# Patient Record
Sex: Female | Born: 1960
Health system: Southern US, Community
[De-identification: ages and names within clinical notes are randomized; demographics above are authoritative.]

## PROBLEM LIST (undated history)

## (undated) DIAGNOSIS — I5032 Chronic diastolic (congestive) heart failure: Secondary | ICD-10-CM

## (undated) DIAGNOSIS — E1165 Type 2 diabetes mellitus with hyperglycemia: Secondary | ICD-10-CM

## (undated) DIAGNOSIS — E669 Obesity, unspecified: Secondary | ICD-10-CM

## (undated) DIAGNOSIS — I10 Essential (primary) hypertension: Secondary | ICD-10-CM

## (undated) DIAGNOSIS — I700 Atherosclerosis of aorta: Secondary | ICD-10-CM

## (undated) DIAGNOSIS — I119 Hypertensive heart disease without heart failure: Secondary | ICD-10-CM

## (undated) HISTORY — DX: Essential (primary) hypertension: I10

## (undated) HISTORY — PX: TUBAL LIGATION: SHX77

## (undated) HISTORY — PX: KIDNEY SURGERY: SHX687

---

## 1999-08-18 ENCOUNTER — Emergency Department (HOSPITAL_COMMUNITY): Admission: EM | Admit: 1999-08-18 | Discharge: 1999-08-18 | Payer: Self-pay | Admitting: Emergency Medicine

## 1999-08-18 ENCOUNTER — Encounter: Payer: Self-pay | Admitting: Emergency Medicine

## 1999-10-29 ENCOUNTER — Encounter: Payer: Self-pay | Admitting: Emergency Medicine

## 1999-10-29 ENCOUNTER — Emergency Department (HOSPITAL_COMMUNITY): Admission: EM | Admit: 1999-10-29 | Discharge: 1999-10-29 | Payer: Self-pay | Admitting: Emergency Medicine

## 2000-06-24 ENCOUNTER — Encounter: Admission: RE | Admit: 2000-06-24 | Discharge: 2000-06-24 | Payer: Self-pay | Admitting: *Deleted

## 2000-06-26 ENCOUNTER — Other Ambulatory Visit: Admission: RE | Admit: 2000-06-26 | Discharge: 2000-06-26 | Payer: Self-pay | Admitting: Family Medicine

## 2000-06-26 ENCOUNTER — Encounter: Payer: Self-pay | Admitting: Family Medicine

## 2000-06-26 ENCOUNTER — Encounter: Admission: RE | Admit: 2000-06-26 | Discharge: 2000-06-26 | Payer: Self-pay | Admitting: Family Medicine

## 2000-07-01 ENCOUNTER — Other Ambulatory Visit: Admission: RE | Admit: 2000-07-01 | Discharge: 2000-07-01 | Payer: Self-pay | Admitting: Family Medicine

## 2003-04-29 ENCOUNTER — Emergency Department (HOSPITAL_COMMUNITY): Admission: EM | Admit: 2003-04-29 | Discharge: 2003-04-29 | Payer: Self-pay | Admitting: Emergency Medicine

## 2006-06-10 ENCOUNTER — Emergency Department (HOSPITAL_COMMUNITY): Admission: EM | Admit: 2006-06-10 | Discharge: 2006-06-10 | Payer: Self-pay | Admitting: Emergency Medicine

## 2006-11-09 ENCOUNTER — Emergency Department (HOSPITAL_COMMUNITY): Admission: EM | Admit: 2006-11-09 | Discharge: 2006-11-09 | Payer: Self-pay | Admitting: Family Medicine

## 2007-03-24 ENCOUNTER — Emergency Department (HOSPITAL_COMMUNITY): Admission: EM | Admit: 2007-03-24 | Discharge: 2007-03-24 | Payer: Self-pay | Admitting: Family Medicine

## 2007-05-30 ENCOUNTER — Emergency Department (HOSPITAL_COMMUNITY): Admission: EM | Admit: 2007-05-30 | Discharge: 2007-05-30 | Payer: Self-pay | Admitting: Family Medicine

## 2009-12-20 ENCOUNTER — Ambulatory Visit (HOSPITAL_COMMUNITY): Admission: RE | Admit: 2009-12-20 | Discharge: 2009-12-20 | Payer: Self-pay | Admitting: Family Medicine

## 2010-07-13 ENCOUNTER — Emergency Department (HOSPITAL_COMMUNITY): Admission: EM | Admit: 2010-07-13 | Discharge: 2010-07-13 | Payer: Self-pay | Admitting: Emergency Medicine

## 2010-12-13 LAB — DIFFERENTIAL
Basophils Absolute: 0 10*3/uL (ref 0.0–0.1)
Basophils Relative: 1 % (ref 0–1)
Eosinophils Absolute: 0.1 10*3/uL (ref 0.0–0.7)
Neutro Abs: 4.1 10*3/uL (ref 1.7–7.7)
Neutrophils Relative %: 66 % (ref 43–77)

## 2010-12-13 LAB — POCT I-STAT, CHEM 8
Creatinine, Ser: 1.4 mg/dL — ABNORMAL HIGH (ref 0.4–1.2)
Glucose, Bld: 104 mg/dL — ABNORMAL HIGH (ref 70–99)
HCT: 44 % (ref 36.0–46.0)
Hemoglobin: 15 g/dL (ref 12.0–15.0)
Potassium: 4.4 mEq/L (ref 3.5–5.1)
Sodium: 138 mEq/L (ref 135–145)
TCO2: 25 mmol/L (ref 0–100)

## 2010-12-13 LAB — CBC
HCT: 40.1 % (ref 36.0–46.0)
Hemoglobin: 13 g/dL (ref 12.0–15.0)
MCH: 27.8 pg (ref 26.0–34.0)
MCHC: 32.4 g/dL (ref 30.0–36.0)
MCV: 85.7 fL (ref 78.0–100.0)
Platelets: 232 10*3/uL (ref 150–400)
RBC: 4.68 MIL/uL (ref 3.87–5.11)
RDW: 18.2 % — ABNORMAL HIGH (ref 11.5–15.5)
WBC: 6.3 10*3/uL (ref 4.0–10.5)

## 2010-12-13 LAB — POCT INFECTIOUS MONO SCREEN: Mono Screen: NEGATIVE

## 2012-12-06 ENCOUNTER — Ambulatory Visit (INDEPENDENT_AMBULATORY_CARE_PROVIDER_SITE_OTHER): Payer: PRIVATE HEALTH INSURANCE | Admitting: Family Medicine

## 2012-12-06 VITALS — BP 186/102 | HR 88 | Temp 98.1°F | Resp 16 | Ht 60.0 in | Wt 163.0 lb

## 2012-12-06 DIAGNOSIS — K0889 Other specified disorders of teeth and supporting structures: Secondary | ICD-10-CM

## 2012-12-06 DIAGNOSIS — R05 Cough: Secondary | ICD-10-CM

## 2012-12-06 DIAGNOSIS — K029 Dental caries, unspecified: Secondary | ICD-10-CM

## 2012-12-06 DIAGNOSIS — R509 Fever, unspecified: Secondary | ICD-10-CM

## 2012-12-06 DIAGNOSIS — J069 Acute upper respiratory infection, unspecified: Secondary | ICD-10-CM

## 2012-12-06 DIAGNOSIS — I10 Essential (primary) hypertension: Secondary | ICD-10-CM

## 2012-12-06 DIAGNOSIS — K089 Disorder of teeth and supporting structures, unspecified: Secondary | ICD-10-CM

## 2012-12-06 DIAGNOSIS — R059 Cough, unspecified: Secondary | ICD-10-CM

## 2012-12-06 LAB — POCT INFLUENZA A/B: Influenza B, POC: NEGATIVE

## 2012-12-06 MED ORDER — BENZONATATE 100 MG PO CAPS: 100.0000 mg | ORAL_CAPSULE | Freq: Three times a day (TID) | ORAL | Status: DC | PRN

## 2012-12-06 MED ORDER — AMOXICILLIN 500 MG PO CAPS: 500.0000 mg | ORAL_CAPSULE | Freq: Three times a day (TID) | ORAL | Status: DC

## 2012-12-06 MED ORDER — TRAMADOL HCL 50 MG PO TABS: 50.0000 mg | ORAL_TABLET | Freq: Four times a day (QID) | ORAL | Status: DC | PRN

## 2012-12-06 NOTE — Patient Instructions (Addendum)
Call your dentist in the morning to be evaluated in the next 2 days. Ultram up to every 6 hours of needed, but stop if any rash or itching and change to tylenol over the counter.  No further alleve for now, and make sure to not exceed the recommended dosage on the container in the future.  Restart your blood pressure medicine when you get home.  Tessalon or Mucinex DM for cough, drink plenty of fluids, and rest as needed.  Return to the clinic or go to the nearest emergency room if any of your symptoms worsen or new symptoms occur.  Cough, Adult  A cough is a reflex that helps clear your throat and airways. It can help heal the body or may be a reaction to an irritated airway. A cough may only last 2 or 3 weeks (acute) or may last more than 8 weeks (chronic).  CAUSES Acute cough:  Viral or bacterial infections. Chronic cough:  Infections.  Allergies.  Asthma.  Post-nasal drip.  Smoking.  Heartburn or acid reflux.  Some medicines.  Chronic lung problems (COPD).  Cancer. SYMPTOMS   Cough.  Fever.  Chest pain.  Increased breathing rate.  High-pitched whistling sound when breathing (wheezing).  Colored mucus that you cough up (sputum). TREATMENT   A bacterial cough may be treated with antibiotic medicine.  A viral cough must run its course and will not respond to antibiotics.  Your caregiver may recommend other treatments if you have a chronic cough. HOME CARE INSTRUCTIONS   Only take over-the-counter or prescription medicines for pain, discomfort, or fever as directed by your caregiver. Use cough suppressants only as directed by your caregiver.  Use a cold steam vaporizer or humidifier in your bedroom or home to help loosen secretions.  Sleep in a semi-upright position if your cough is worse at night.  Rest as needed.  Stop smoking if you smoke. SEEK IMMEDIATE MEDICAL CARE IF:   You have pus in your sputum.  Your cough starts to worsen.  You cannot control  your cough with suppressants and are losing sleep.  You begin coughing up blood.  You have difficulty breathing.  You develop pain which is getting worse or is uncontrolled with medicine.  You have a fever. MAKE SURE YOU:   Understand these instructions.  Will watch your condition.  Will get help right away if you are not doing well or get worse. Document Released: 03/15/2011 Document Revised: 12/09/2011 Document Reviewed: 03/15/2011 [facility] Patient Information 2013 [place], [place].   Dental Pain A tooth ache may be caused by cavities (tooth decay). Cavities expose the nerve of the tooth to air and hot or cold temperatures. It may come from an infection or abscess (also called a boil or furuncle) around your tooth. It is also often caused by dental caries (tooth decay). This causes the pain you are having. DIAGNOSIS  Your caregiver can diagnose this problem by exam. TREATMENT   If caused by an infection, it may be treated with medications which kill germs (antibiotics) and pain medications as prescribed by your caregiver. Take medications as directed.  Only take over-the-counter or prescription medicines for pain, discomfort, or fever as directed by your caregiver.  Whether the tooth ache today is caused by infection or dental disease, you should see your dentist as soon as possible for further care. SEEK MEDICAL CARE IF: The exam and treatment you received today has been provided on an emergency basis only. This is not a substitute for  complete medical or dental care. If your problem worsens or new problems (symptoms) appear, and you are unable to meet with your dentist, call or return to this location. SEEK IMMEDIATE MEDICAL CARE IF:   You have a fever.  You develop redness and swelling of your face, jaw, or neck.  You are unable to open your mouth.  You have severe pain uncontrolled by pain medicine. MAKE SURE YOU:   Understand these instructions.  Will watch your  condition.  Will get help right away if you are not doing well or get worse. Document Released: 09/16/2005 Document Revised: 12/09/2011 Document Reviewed: 05/04/2008 [facility] Patient Information 2013 [place], [place].
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
## 2013-09-28 ENCOUNTER — Encounter (HOSPITAL_COMMUNITY): Payer: Self-pay | Admitting: Emergency Medicine

## 2013-09-28 ENCOUNTER — Emergency Department (HOSPITAL_COMMUNITY): Payer: PRIVATE HEALTH INSURANCE

## 2013-09-28 ENCOUNTER — Emergency Department (HOSPITAL_COMMUNITY)
Admission: EM | Admit: 2013-09-28 | Discharge: 2013-09-28 | Disposition: A | Discharge status: Home / Self Care | Payer: PRIVATE HEALTH INSURANCE | Attending: Emergency Medicine | Admitting: Emergency Medicine

## 2013-09-28 DIAGNOSIS — I10 Essential (primary) hypertension: Secondary | ICD-10-CM | POA: Insufficient documentation

## 2013-09-28 DIAGNOSIS — R000 Tachycardia, unspecified: Secondary | ICD-10-CM | POA: Insufficient documentation

## 2013-09-28 DIAGNOSIS — Z79899 Other long term (current) drug therapy: Secondary | ICD-10-CM | POA: Insufficient documentation

## 2013-09-28 DIAGNOSIS — R1084 Generalized abdominal pain: Secondary | ICD-10-CM | POA: Insufficient documentation

## 2013-09-28 DIAGNOSIS — F17200 Nicotine dependence, unspecified, uncomplicated: Secondary | ICD-10-CM | POA: Insufficient documentation

## 2013-09-28 DIAGNOSIS — K0889 Other specified disorders of teeth and supporting structures: Secondary | ICD-10-CM

## 2013-09-28 DIAGNOSIS — J069 Acute upper respiratory infection, unspecified: Secondary | ICD-10-CM | POA: Insufficient documentation

## 2013-09-28 DIAGNOSIS — B9789 Other viral agents as the cause of diseases classified elsewhere: Secondary | ICD-10-CM

## 2013-09-28 DIAGNOSIS — K089 Disorder of teeth and supporting structures, unspecified: Secondary | ICD-10-CM | POA: Insufficient documentation

## 2013-09-28 MED ORDER — IBUPROFEN 800 MG PO TABS: 800.0000 mg | ORAL_TABLET | Freq: Three times a day (TID) | ORAL | Status: DC | PRN

## 2013-09-28 MED ORDER — BENZONATATE 100 MG PO CAPS
100.0000 mg | ORAL_CAPSULE | Freq: Three times a day (TID) | ORAL | Status: DC
Start: 2013-09-28 — End: 2014-05-03

## 2013-09-28 MED ORDER — PENICILLIN V POTASSIUM 500 MG PO TABS: 500.0000 mg | ORAL_TABLET | Freq: Four times a day (QID) | ORAL | Status: AC

## 2013-09-28 MED ORDER — IBUPROFEN 800 MG PO TABS
800.0000 mg | ORAL_TABLET | Freq: Once | ORAL | Status: AC
  Administered 2013-09-28: 800 mg via ORAL
  Filled 2013-09-28: qty 1

## 2013-09-28 MED ORDER — ALBUTEROL SULFATE (2.5 MG/3ML) 0.083% IN NEBU
5.0000 mg | INHALATION_SOLUTION | Freq: Once | RESPIRATORY_TRACT | Status: AC
  Administered 2013-09-28: 5 mg via RESPIRATORY_TRACT
  Filled 2013-09-28: qty 6

## 2013-09-28 MED ORDER — ALBUTEROL SULFATE HFA 108 (90 BASE) MCG/ACT IN AERS: 1.0000 | INHALATION_SPRAY | Freq: Four times a day (QID) | RESPIRATORY_TRACT | Status: DC | PRN

## 2013-09-28 MED ORDER — ALBUTEROL SULFATE HFA 108 (90 BASE) MCG/ACT IN AERS: 2.0000 | INHALATION_SPRAY | Freq: Once | RESPIRATORY_TRACT | Status: DC

## 2013-09-28 MED ORDER — ONDANSETRON 8 MG PO TBDP
8.0000 mg | ORAL_TABLET | Freq: Once | ORAL | Status: AC
  Administered 2013-09-28: 8 mg via ORAL
  Filled 2013-09-28: qty 1

## 2013-09-28 NOTE — ED Provider Notes (Signed)
CSN: [id number]     Arrival date & time 09/28/13  1628 History   First MD Initiated Contact with Patient 09/28/13 1738 This chart was scribed for non-physician practitioner Trixie Dredge, PA-C working with Gerhard Munch, MD by Valera Castle, ED scribe. This patient was seen in room WTR7/WTR7 and the patient's care was started at 5:41 PM.     Chief Complaint  Patient presents with  . Generalized Body Aches  . Fever  . Nausea  . Sore Throat  . Headache  . Oral Swelling    The history is provided by the patient.   HPI Comments: Debbie Davis is a 52 y.o. female who presents to the Emergency Department complaining of  flu symptoms, including moderate fever, constant nausea, dry cough, stuffy nose, SOB, headache, generalized body aches, sore throat, decreased appetite, and fatigue, onset yesterday. She reports not eating due to the nausea and decreased appetite. She also reports sudden, moderate, constant, left, upper dental pain and oral swelling, onset 2 days ago. She reports taking Aleve last night, with no relief. She denies vomiting, diarrhea, and any other associated symptoms. She reports h/o smoking. She denies h/o asthma.    PCP - No primary provider on file.  Past Medical History  Diagnosis Date  . Allergy   . Hypertension    Past Surgical History  Procedure Laterality Date  . Tubal ligation    . Kidney surgery Right     pt states she had blockage   Family History  Problem Relation Age of Onset  . Cancer Mother   . Heart disease Father   . Diabetes Sister   . Diabetes Brother   . Diabetes Sister   . Diabetes Sister   . Diabetes Sister    History  Substance Use Topics  . Smoking status: Current Every Day Smoker -- 0.50 packs/day    Types: Cigarettes  . Smokeless tobacco: Not on file  . Alcohol Use: Yes   OB History   Grav Para Term Preterm Abortions TAB SAB Ect Mult Living                 Review of Systems  Constitutional: Positive for fever, appetite change  (decreased) and fatigue.  HENT: Positive for congestion (nasal), dental problem and sore throat.   Respiratory: Positive for cough (dry) and shortness of breath.   Gastrointestinal: Positive for nausea. Negative for vomiting and diarrhea.  Musculoskeletal: Positive for myalgias (generalized).  Neurological: Positive for headaches.    Allergies  Codeine and Morphine and related  Home Medications   Current Outpatient Rx  Name  Route  Sig  Dispense  Refill  . naproxen sodium (ANAPROX) 220 MG tablet   Oral   Take 220 mg by mouth 2 (two) times daily with a meal.         . lisinopril-hydrochlorothiazide (PRINZIDE,ZESTORETIC) 20-12.5 MG per tablet   Oral   Take 1 tablet by mouth daily.         . traMADol (ULTRAM) 50 MG tablet   Oral   Take 1 tablet (50 mg total) by mouth every 6 (six) hours as needed for pain.   20 tablet   0    BP 157/97  Pulse 116  Temp(Src) 100.4 F (38 C) (Oral)  Resp 20  SpO2 94%  Physical Exam  Nursing note and vitals reviewed. Constitutional: She appears well-developed and well-nourished. No distress.  HENT:  Head: Normocephalic and atraumatic.  Mouth/Throat: Uvula is midline, oropharynx is clear  and moist and mucous membranes are normal.    Left upper 3rd molar is tender and loose. Adjacent facial tenderness.  Neck: Neck supple. No thyromegaly present.  No tracheal tenderness.  Cardiovascular: Normal rate, regular rhythm and normal heart sounds.  Exam reveals no gallop and no friction rub.   No murmur heard. Pulmonary/Chest: Effort normal. No respiratory distress. She has wheezes (respiratory wheezing). She has no rales.  Abdominal: Soft. She exhibits no distension. There is tenderness (Generalized tenderness). There is no rebound and no guarding.  Lymphadenopathy:    She has cervical adenopathy (Anterior cervical adenopathy.).  Neurological: She is alert.  Skin: She is not diaphoretic.    ED Course  Procedures (including critical care  time)  DIAGNOSTIC STUDIES: Oxygen Saturation is 94% on room air, adequate by my interpretation.    COORDINATION OF CARE: 5:46 PM-Discussed treatment plan which includes breathing treatment, CXR, and Ibuprofen with pt at bedside and pt agreed to plan. Will recheck pt's lungs after treatment.  Labs Review Labs Reviewed - No data to display Imaging Review Dg Chest 2 View  09/28/2013   CLINICAL DATA:  Fever, cough  EXAM: CHEST  2 VIEW  COMPARISON:  None.  FINDINGS: The heart size and mediastinal contours are within normal limits. Both lungs are clear. The visualized skeletal structures are unremarkable.  IMPRESSION: No active cardiopulmonary disease.   Electronically Signed   By: Elige Ko   On: 09/28/2013 18:12    EKG Interpretation   None      After neb treatment lungs CTAB, moving air well in all fields.   MDM   1. Viral respiratory illness   2. Pain, dental      Pt with constellation of symptoms suggestive of influenza or other viral respiratory illness.  Tachycardic, febrile, O2 sat 94% on room air.  CXR negative.  Given nebs, ibuprofen, zofran, PO fluids in ED.  D/C home with albuterol, tessalon, ibuprofen, penicillin (for dental issue).  No e/o deep space head or neck infection, doubt ludwig's angina. Discussed result, findings, treatment, and follow up  with patient.  Pt given return precautions.  Pt verbalizes understanding and agrees with plan.        I personally performed the services described in this documentation, which was scribed in my presence. The recorded information has been reviewed and is accurate.    Trixie Dredge, PA-C 09/28/13 1940

## 2013-09-28 NOTE — ED Provider Notes (Signed)
  Medical screening examination/treatment/procedure(s) were performed by non-physician practitioner and as supervising physician I was immediately available for consultation/collaboration.  EKG Interpretation   None          Jaivon Vanbeek, MD 09/28/13 2125 

## 2013-09-28 NOTE — ED Notes (Signed)
Pt from home c/o flu-like s/sx sicne yesterday and a L upper tooth abscess. Pt reports body aches, sore throat, fever, dry cough, nausea. Pt denies V/D. Pt tooth pain has been hurting 2 days. Pt is A&O and in NAD

## 2014-05-03 ENCOUNTER — Ambulatory Visit (INDEPENDENT_AMBULATORY_CARE_PROVIDER_SITE_OTHER): Payer: Managed Care, Other (non HMO) | Admitting: Family Medicine

## 2014-05-03 ENCOUNTER — Ambulatory Visit (INDEPENDENT_AMBULATORY_CARE_PROVIDER_SITE_OTHER): Payer: Managed Care, Other (non HMO)

## 2014-05-03 VITALS — BP 172/108 | HR 108 | Temp 98.0°F | Resp 18 | Ht 60.0 in | Wt 172.0 lb

## 2014-05-03 DIAGNOSIS — Z9119 Patient's noncompliance with other medical treatment and regimen: Secondary | ICD-10-CM

## 2014-05-03 DIAGNOSIS — M25571 Pain in right ankle and joints of right foot: Secondary | ICD-10-CM

## 2014-05-03 DIAGNOSIS — M25569 Pain in unspecified knee: Secondary | ICD-10-CM

## 2014-05-03 DIAGNOSIS — M25561 Pain in right knee: Secondary | ICD-10-CM

## 2014-05-03 DIAGNOSIS — I10 Essential (primary) hypertension: Secondary | ICD-10-CM

## 2014-05-03 DIAGNOSIS — R300 Dysuria: Secondary | ICD-10-CM

## 2014-05-03 DIAGNOSIS — M25579 Pain in unspecified ankle and joints of unspecified foot: Secondary | ICD-10-CM

## 2014-05-03 DIAGNOSIS — Z91199 Patient's noncompliance with other medical treatment and regimen due to unspecified reason: Secondary | ICD-10-CM

## 2014-05-03 DIAGNOSIS — M79604 Pain in right leg: Secondary | ICD-10-CM

## 2014-05-03 DIAGNOSIS — N390 Urinary tract infection, site not specified: Secondary | ICD-10-CM

## 2014-05-03 DIAGNOSIS — M79609 Pain in unspecified limb: Secondary | ICD-10-CM

## 2014-05-03 LAB — COMPREHENSIVE METABOLIC PANEL
ALBUMIN: 4 g/dL (ref 3.5–5.2)
ALT: 21 U/L (ref 0–35)
AST: 20 U/L (ref 0–37)
Alkaline Phosphatase: 109 U/L (ref 39–117)
BUN: 16 mg/dL (ref 6–23)
CO2: 28 meq/L (ref 19–32)
Calcium: 9.3 mg/dL (ref 8.4–10.5)
Chloride: 107 mEq/L (ref 96–112)
Creat: 0.99 mg/dL (ref 0.50–1.10)
GLUCOSE: 93 mg/dL (ref 70–99)
POTASSIUM: 4.7 meq/L (ref 3.5–5.3)
SODIUM: 140 meq/L (ref 135–145)
TOTAL PROTEIN: 7.1 g/dL (ref 6.0–8.3)
Total Bilirubin: 0.2 mg/dL (ref 0.2–1.2)

## 2014-05-03 LAB — POCT URINALYSIS DIPSTICK
Bilirubin, UA: NEGATIVE
Glucose, UA: NEGATIVE
Nitrite, UA: NEGATIVE
Protein, UA: 30
Spec Grav, UA: 1.02
Urobilinogen, UA: 0.2
pH, UA: 5

## 2014-05-03 LAB — POCT UA - MICROSCOPIC ONLY
Casts, Ur, LPF, POC: NEGATIVE
Crystals, Ur, HPF, POC: NEGATIVE
Yeast, UA: NEGATIVE

## 2014-05-03 LAB — D-DIMER, QUANTITATIVE (NOT AT ARMC): D DIMER QUANT: 0.43 ug{FEU}/mL (ref 0.00–0.48)

## 2014-05-03 MED ORDER — TRAMADOL HCL 50 MG PO TABS: 50.0000 mg | ORAL_TABLET | Freq: Four times a day (QID) | ORAL | Status: DC | PRN

## 2014-05-03 MED ORDER — NITROFURANTOIN MONOHYD MACRO 100 MG PO CAPS: 100.0000 mg | ORAL_CAPSULE | Freq: Two times a day (BID) | ORAL | Status: DC

## 2014-05-03 MED ORDER — LISINOPRIL-HYDROCHLOROTHIAZIDE 20-12.5 MG PO TABS: 1.0000 | ORAL_TABLET | Freq: Every day | ORAL | Status: DC

## 2014-05-03 NOTE — Progress Notes (Addendum)
Subjective:    Patient ID: Debbie Davis, female    DOB: September 23, 1961, 53 y.o.   MRN: [id number] This chart was scribed for Meredith Staggers MD by Julian Hy, ED Scribe. The patient was seen in Room 11. The patient's care was started at 11:22 AM.   Chief Complaint  Patient presents with  . Knee Pain  . Ankle Pain    x 4 days  hit ankle on wheelchair   . Dysuria    HPI HPI Comments: Debbie Davis is a 53 y.o. female who presents to the [facility] complaining of right lower extremity swelling and pain onset 8/1. Pt reports right knee, right calf and right ankle pain. Pt reports she may have bumped her leg on a wheelchair but didn't have any pain it until she woke up the next morning. Pt denies twisting her knee.Pt denies SOB, difficulty breathing, calf swelling, chest pain, vision changes, weakness, slurred speech, light-headedness or dizziness. Pt denies travelling out of the country. Pt reports she drove to [place], about 5 hour drive on 6/57. Pt reports she is a smoker. Pt denies taking any medications or hormones. Pt reports she stopped taking BP medication in May 2015. Pt reports she ran out of medications, and was needing follow up with PCP prior to refill.  Pt reports she has occasional headaches. Pt reports taking tow doses of Aleve since her leg pain started.  Pt reports she previously fell and hit her right knee at work in 07/2013. Pt reports she sees a worker's compensation provider for her knee pain. Pt states her currently pain feels different than her previous pain.  Pt reports she frequently drinks cranberry juice and water. Pt reports strong urine and dark urine. Pt denies burning when she urinates but notices some pressure. Pt denies taking OTC medications to relieve her symptoms.  PCP: Dr. Serafina Royals. Last seen in 08/2013.  Pt denies personal history of PE/DVT. Pt reports she has a family history of PE/DVT in her siblings.   She was last seen by me in March  2015 for acute illness then. She was off her medications at that visit with a BP of 186/102.   There are no active problems to display for this patient.  Past Medical History  Diagnosis Date  . Allergy   . Hypertension    Past Surgical History  Procedure Laterality Date  . Tubal ligation    . Kidney surgery Right     pt states she had blockage   Allergies  Allergen Reactions  . Codeine Rash  . Morphine And Related Rash   Prior to Admission medications   Medication Sig Start Date End Date Taking? Authorizing Provider  Naproxen Sod-Diphenhydramine (ALEVE PM PO) Take by mouth as needed.    Yes Historical Provider, MD   History   Social History  . Marital Status: Single    Spouse Name: N/A    Number of Children: N/A  . Years of Education: N/A   Occupational History  . Not on file.   Social History Main Topics  . Smoking status: Current Every Day Smoker -- 0.50 packs/day for 30 years    Types: Cigarettes  . Smokeless tobacco: Not on file  . Alcohol Use: Yes     Comment: special occasions   . Drug Use: No  . Sexual Activity: Not on file   Other Topics Concern  . Not on file   Social History Narrative  . No narrative  on file   Review of Systems  Constitutional: Negative for fever, chills, fatigue and unexpected weight change.  Eyes: Negative for visual disturbance.  Respiratory: Negative for chest tightness and shortness of breath.   Cardiovascular: Positive for leg swelling (right). Negative for chest pain and palpitations.  Gastrointestinal: Negative for nausea, vomiting, abdominal pain and blood in stool.  Musculoskeletal: Positive for arthralgias (right lower extremity ) and joint swelling (right lower extremity ).  Neurological: Negative for dizziness, syncope, speech difficulty, weakness, light-headedness and headaches.  All other systems reviewed and are negative.   Objective:   Physical Exam  Nursing note and vitals reviewed. Constitutional: She is  oriented to person, place, and time. She appears well-developed and well-nourished. No distress.  HENT:  Head: Normocephalic and atraumatic.  Eyes: Conjunctivae and EOM are normal. Pupils are equal, round, and reactive to light.  Neck: Neck supple. Carotid bruit is not present. No tracheal deviation present.  Cardiovascular: Normal rate, regular rhythm, normal heart sounds and intact distal pulses.   Pulmonary/Chest: Effort normal and breath sounds normal. No respiratory distress.  Abdominal: Soft. She exhibits no pulsatile midline mass. There is no tenderness.  Musculoskeletal: Normal range of motion.  Tenderness over the right, lateral malleolus. Diffusely tender into the lateral foot into the 5th metatarsal. Diffuse soreness over the entire right lower leg from the right upper knee, down towards the ankle including the calf.  Calf circumference at 37 cm at 15 cm below the patella on the right. On her left calf, calf circumference is 37 cm.  Neurological: She is alert and oriented to person, place, and time.  Skin: Skin is warm and dry.  Psychiatric: She has a normal mood and affect. Her behavior is normal.   Filed Vitals:   05/03/14 0932  BP: 172/108  Pulse: 108  Temp: 98 F (36.7 C)  TempSrc: Oral  Resp: 18  Height: 5' (1.524 m)  Weight: 172 lb (78.019 kg)  SpO2: 97%   [facility] reading (PRIMARY) by  Dr. Neva Seat   Right Ankle. No apparent fracture, minimal soft tissue swelling laterally.  Right Foot: No fracture.  Right Knee: No fracture or acute findings.   Results for orders placed in visit on 05/03/14  POCT UA - MICROSCOPIC ONLY      Result Value Ref Range   WBC, Ur, HPF, POC 15-20     RBC, urine, microscopic 0-3     Bacteria, U Microscopic 3+     Mucus, UA trace     Epithelial cells, urine per micros 2-6     Crystals, Ur, HPF, POC neg     Casts, Ur, LPF, POC neg     Yeast, UA neg    POCT URINALYSIS DIPSTICK      Result Value Ref Range   Color, UA dark yellow      Clarity, UA cloudy     Glucose, UA neg     Bilirubin, UA neg     Ketones, UA trace     Spec Grav, UA 1.020     Blood, UA small     pH, UA 5.0     Protein, UA 30     Urobilinogen, UA 0.2     Nitrite, UA neg     Leukocytes, UA small (1+)      Assessment & Plan:   ALEGANDRA SOMMERS is a 53 y.o. female Dysuria Urinary tract infection, site not specified - Plan: Urine culture, nitrofurantoin, macrocrystal-monohydrate, (MACROBID) 100 MG capsule  -  start macrobid, urine cx, rtc precautions.   Ankle pain, right - Plan: DG Ankle Complete Right, DG Foot Complete Right, D-dimer, quantitative, traMADol (ULTRAM) 50 MG tablet Knee pain, right - Plan: DG Knee Complete 4 Views Right, D-dimer, quantitative, traMADol (ULTRAM) 50 MG tablet Leg pain, right - Plan: D-dimer, quantitative, traMADol (ULTRAM) 50 MG tablet  No apparent calf edema and measured equal. D dimer obtained with prior travel and . Possible contusion of ankle vs sprain, and pain to knee with prior knee pain from injury at work - advised to follow up with W/C provider for knee pain, sweedo brace, out of work to elevate and relative rest of leg next 2 days, and recheck in 2 days. Ultram Q6h prn, (SED, avoiding NSAIDS with elevated BP).  ER precautions discussed.   Essential hypertension - Plan: lisinopril-hydrochlorothiazide (PRINZIDE,ZESTORETIC) 20-12.5 MG per tablet, Comprehensive metabolic panel,  -uncontrolled off meds. Risk of nonadherence to medical treatment discussed, not limited to cardiac diease, stroke, renal disease/failure. 30 day supply of meds provided, and advised to call her primary provider's office for follow up before this rx expires. ER/911/RTC precautions discussed.    Meds ordered this encounter  Medications  . Naproxen Sod-Diphenhydramine (ALEVE PM PO)    Sig: Take by mouth as needed.   [name] lisinopril-hydrochlorothiazide (PRINZIDE,ZESTORETIC) 20-12.5 MG per tablet    Sig: Take 1 tablet by mouth daily.    Dispense:   30 tablet    Refill:  0  . nitrofurantoin, macrocrystal-monohydrate, (MACROBID) 100 MG capsule    Sig: Take 1 capsule (100 mg total) by mouth 2 (two) times daily.    Dispense:  14 capsule    Refill:  0  . traMADol (ULTRAM) 50 MG tablet    Sig: Take 1 tablet (50 mg total) by mouth every 6 (six) hours as needed.    Dispense:  20 tablet    Refill:  0   Patient Instructions  We will refill your blood pressure medicine for next 30 days, but you need to be seen by your primary care doctor for follow up in next few weeks.  Keep a record of your blood pressures outside of the office and bring them to the next office visit. Return to the clinic or go to the nearest emergency room if any of your symptoms worsen or new symptoms occur. Start antibiotic for urine infection.  Brace for ankle, elevate leg and ice to ankle as needed, range of motion for ankle and knee, and recheck in 2 days.  If the test for blood clots is elevated - we will call to schedule an ultrasound of your leg.   Urinary Tract Infection Urinary tract infections (UTIs) can develop anywhere along your urinary tract. Your urinary tract is your body's drainage system for removing wastes and extra water. Your urinary tract includes two kidneys, two ureters, a bladder, and a urethra. Your kidneys are a pair of bean-shaped organs. Each kidney is about the size of your fist. They are located below your ribs, one on each side of your spine. CAUSES Infections are caused by microbes, which are microscopic organisms, including fungi, viruses, and bacteria. These organisms are so small that they can only be seen through a microscope. Bacteria are the microbes that most commonly cause UTIs. SYMPTOMS  Symptoms of UTIs may vary by age and gender of the patient and by the location of the infection. Symptoms in young women typically include a frequent and intense urge to urinate and a painful, burning  feeling in the bladder or urethra during urination.  Older women and men are more likely to be tired, shaky, and weak and have muscle aches and abdominal pain. A fever may mean the infection is in your kidneys. Other symptoms of a kidney infection include pain in your back or sides below the ribs, nausea, and vomiting. DIAGNOSIS To diagnose a UTI, your caregiver will ask you about your symptoms. Your caregiver also will ask to provide a urine sample. The urine sample will be tested for bacteria and white blood cells. White blood cells are made by your body to help fight infection. TREATMENT  Typically, UTIs can be treated with medication. Because most UTIs are caused by a bacterial infection, they usually can be treated with the use of antibiotics. The choice of antibiotic and length of treatment depend on your symptoms and the type of bacteria causing your infection. HOME CARE INSTRUCTIONS  If you were prescribed antibiotics, take them exactly as your caregiver instructs you. Finish the medication even if you feel better after you have only taken some of the medication.  Drink enough water and fluids to keep your urine clear or pale yellow.  Avoid caffeine, tea, and carbonated beverages. They tend to irritate your bladder.  Empty your bladder often. Avoid holding urine for long periods of time.  Empty your bladder before and after sexual intercourse.  After a bowel movement, women should cleanse from front to back. Use each tissue only once. SEEK MEDICAL CARE IF:   You have back pain.  You develop a fever.  Your symptoms do not begin to resolve within 3 days. SEEK IMMEDIATE MEDICAL CARE IF:   You have severe back pain or lower abdominal pain.  You develop chills.  You have nausea or vomiting.  You have continued burning or discomfort with urination. MAKE SURE YOU:   Understand these instructions.  Will watch your condition.  Will get help right away if you are not doing well or get worse. Document Released: 06/26/2005 Document  Revised: 03/17/2012 Document Reviewed: 10/25/2011 [facility] Patient Information 2015 [place], [place]. This information is not intended to replace advice given to you by your health care provider. Make sure you discuss any questions you have with your health care provider.       I personally performed the services described in this documentation, which was scribed in my presence. The recorded information has been reviewed and considered, and addended by me as needed.

## 2014-05-03 NOTE — Patient Instructions (Addendum)
We will refill your blood pressure medicine for next 30 days, but you need to be seen by your primary care doctor for follow up in next few weeks.  Keep a record of your blood pressures outside of the office and bring them to the next office visit. Return to the clinic or go to the nearest emergency room if any of your symptoms worsen or new symptoms occur. Start antibiotic for urine infection.  Brace for ankle, elevate leg and ice to ankle as needed, range of motion for ankle and knee, and recheck in 2 days.  If the test for blood clots is elevated - we will call to schedule an ultrasound of your leg.   Urinary Tract Infection Urinary tract infections (UTIs) can develop anywhere along your urinary tract. Your urinary tract is your body's drainage system for removing wastes and extra water. Your urinary tract includes two kidneys, two ureters, a bladder, and a urethra. Your kidneys are a pair of bean-shaped organs. Each kidney is about the size of your fist. They are located below your ribs, one on each side of your spine. CAUSES Infections are caused by microbes, which are microscopic organisms, including fungi, viruses, and bacteria. These organisms are so small that they can only be seen through a microscope. Bacteria are the microbes that most commonly cause UTIs. SYMPTOMS  Symptoms of UTIs may vary by age and gender of the patient and by the location of the infection. Symptoms in young women typically include a frequent and intense urge to urinate and a painful, burning feeling in the bladder or urethra during urination. Older women and men are more likely to be tired, shaky, and weak and have muscle aches and abdominal pain. A fever may mean the infection is in your kidneys. Other symptoms of a kidney infection include pain in your back or sides below the ribs, nausea, and vomiting. DIAGNOSIS To diagnose a UTI, your caregiver will ask you about your symptoms. Your caregiver also will ask to  provide a urine sample. The urine sample will be tested for bacteria and white blood cells. White blood cells are made by your body to help fight infection. TREATMENT  Typically, UTIs can be treated with medication. Because most UTIs are caused by a bacterial infection, they usually can be treated with the use of antibiotics. The choice of antibiotic and length of treatment depend on your symptoms and the type of bacteria causing your infection. HOME CARE INSTRUCTIONS  If you were prescribed antibiotics, take them exactly as your caregiver instructs you. Finish the medication even if you feel better after you have only taken some of the medication.  Drink enough water and fluids to keep your urine clear or pale yellow.  Avoid caffeine, tea, and carbonated beverages. They tend to irritate your bladder.  Empty your bladder often. Avoid holding urine for long periods of time.  Empty your bladder before and after sexual intercourse.  After a bowel movement, women should cleanse from front to back. Use each tissue only once. SEEK MEDICAL CARE IF:   You have back pain.  You develop a fever.  Your symptoms do not begin to resolve within 3 days. SEEK IMMEDIATE MEDICAL CARE IF:   You have severe back pain or lower abdominal pain.  You develop chills.  You have nausea or vomiting.  You have continued burning or discomfort with urination. MAKE SURE YOU:   Understand these instructions.  Will watch your condition.  Will get help right  away if you are not doing well or get worse. Document Released: 06/26/2005 Document Revised: 03/17/2012 Document Reviewed: 10/25/2011 [facility] Patient Information 2015 [place], [place]. This information is not intended to replace advice given to you by your health care provider. Make sure you discuss any questions you have with your health care provider.

## 2014-05-04 LAB — URINE CULTURE
COLONY COUNT: NO GROWTH
Organism ID, Bacteria: NO GROWTH

## 2014-05-07 NOTE — Progress Notes (Signed)
Called in.

## 2014-05-18 ENCOUNTER — Telehealth: Payer: Self-pay

## 2014-05-18 NOTE — Telephone Encounter (Signed)
Patient called. States she had a missed call from us. Please return call. [phone]

## 2014-05-19 NOTE — Telephone Encounter (Signed)
Returned call. Advised pt lab results.

## 2014-05-23 ENCOUNTER — Encounter: Payer: Self-pay | Admitting: Radiology

## 2015-12-04 ENCOUNTER — Encounter (HOSPITAL_COMMUNITY): Payer: Self-pay | Admitting: Emergency Medicine

## 2015-12-04 ENCOUNTER — Emergency Department (HOSPITAL_COMMUNITY): Payer: Managed Care, Other (non HMO)

## 2015-12-04 ENCOUNTER — Emergency Department (HOSPITAL_COMMUNITY)
Admission: EM | Admit: 2015-12-04 | Discharge: 2015-12-04 | Disposition: A | Discharge status: Home / Self Care | Payer: Managed Care, Other (non HMO) | Attending: Emergency Medicine | Admitting: Emergency Medicine

## 2015-12-04 DIAGNOSIS — I10 Essential (primary) hypertension: Secondary | ICD-10-CM | POA: Insufficient documentation

## 2015-12-04 DIAGNOSIS — N390 Urinary tract infection, site not specified: Secondary | ICD-10-CM | POA: Diagnosis not present

## 2015-12-04 DIAGNOSIS — R1084 Generalized abdominal pain: Secondary | ICD-10-CM

## 2015-12-04 DIAGNOSIS — Z9851 Tubal ligation status: Secondary | ICD-10-CM | POA: Diagnosis not present

## 2015-12-04 DIAGNOSIS — R10816 Epigastric abdominal tenderness: Secondary | ICD-10-CM

## 2015-12-04 DIAGNOSIS — Z9889 Other specified postprocedural states: Secondary | ICD-10-CM | POA: Diagnosis not present

## 2015-12-04 DIAGNOSIS — Z79899 Other long term (current) drug therapy: Secondary | ICD-10-CM | POA: Insufficient documentation

## 2015-12-04 DIAGNOSIS — R1013 Epigastric pain: Secondary | ICD-10-CM | POA: Diagnosis present

## 2015-12-04 DIAGNOSIS — R51 Headache: Secondary | ICD-10-CM | POA: Insufficient documentation

## 2015-12-04 DIAGNOSIS — F17210 Nicotine dependence, cigarettes, uncomplicated: Secondary | ICD-10-CM | POA: Insufficient documentation

## 2015-12-04 LAB — COMPREHENSIVE METABOLIC PANEL
ALT: 23 U/L (ref 14–54)
AST: 22 U/L (ref 15–41)
Albumin: 4.1 g/dL (ref 3.5–5.0)
Alkaline Phosphatase: 88 U/L (ref 38–126)
Anion gap: 7 (ref 5–15)
BILIRUBIN TOTAL: 0.4 mg/dL (ref 0.3–1.2)
BUN: 16 mg/dL (ref 6–20)
CO2: 25 mmol/L (ref 22–32)
CREATININE: 0.93 mg/dL (ref 0.44–1.00)
Calcium: 9.5 mg/dL (ref 8.9–10.3)
Chloride: 103 mmol/L (ref 101–111)
GFR calc Af Amer: >60 mL/min (ref >60)
Glucose, Bld: 137 mg/dL — ABNORMAL HIGH (ref 65–99)
Potassium: 4.6 mmol/L (ref 3.5–5.1)
Sodium: 135 mmol/L (ref 135–145)
TOTAL PROTEIN: 8.1 g/dL (ref 6.5–8.1)

## 2015-12-04 LAB — URINALYSIS, ROUTINE W REFLEX MICROSCOPIC
GLUCOSE, UA: NEGATIVE mg/dL
KETONES UR: NEGATIVE mg/dL
NITRITE: NEGATIVE
PH: 5.5 (ref 5.0–8.0)
Protein, ur: 100 mg/dL — AB
SPECIFIC GRAVITY, URINE: 1.03 (ref 1.005–1.030)

## 2015-12-04 LAB — URINE MICROSCOPIC-ADD ON

## 2015-12-04 LAB — CBC
HCT: 47.6 % — ABNORMAL HIGH (ref 36.0–46.0)
Hemoglobin: 15.5 g/dL — ABNORMAL HIGH (ref 12.0–15.0)
MCH: 28.3 pg (ref 26.0–34.0)
MCHC: 32.6 g/dL (ref 30.0–36.0)
MCV: 87 fL (ref 78.0–100.0)
PLATELETS: 212 10*3/uL (ref 150–400)
RBC: 5.47 MIL/uL — ABNORMAL HIGH (ref 3.87–5.11)
RDW: 14.7 % (ref 11.5–15.5)
WBC: 8.4 10*3/uL (ref 4.0–10.5)

## 2015-12-04 LAB — LIPASE, BLOOD: LIPASE: 44 U/L (ref 11–51)

## 2015-12-04 MED ORDER — IOHEXOL 300 MG/ML  SOLN
100.0000 mL | Freq: Once | INTRAMUSCULAR | Status: AC | PRN
  Administered 2015-12-04: 100 mL via INTRAVENOUS

## 2015-12-04 MED ORDER — FENTANYL CITRATE (PF) 100 MCG/2ML IJ SOLN
50.0000 ug | INTRAMUSCULAR | Status: DC | PRN
  Administered 2015-12-04 (×2): 50 ug via INTRAVENOUS
  Filled 2015-12-04 (×2): qty 2

## 2015-12-04 MED ORDER — CEPHALEXIN 500 MG PO CAPS: 500.0000 mg | ORAL_CAPSULE | Freq: Three times a day (TID) | ORAL | Status: DC

## 2015-12-04 MED ORDER — DEXTROSE 5 % IV SOLN
1.0000 g | Freq: Once | INTRAVENOUS | Status: AC
  Administered 2015-12-04: 1 g via INTRAVENOUS
  Filled 2015-12-04: qty 10

## 2015-12-04 MED ORDER — ONDANSETRON HCL 4 MG/2ML IJ SOLN
4.0000 mg | Freq: Four times a day (QID) | INTRAMUSCULAR | Status: DC | PRN
Start: 2015-12-04 — End: 2015-12-04
  Administered 2015-12-04: 4 mg via INTRAVENOUS
  Filled 2015-12-04: qty 2

## 2015-12-04 MED ORDER — OXYCODONE-ACETAMINOPHEN 5-325 MG PO TABS
1.0000 | ORAL_TABLET | Freq: Once | ORAL | Status: AC
  Administered 2015-12-04: 1 via ORAL
  Filled 2015-12-04: qty 1

## 2015-12-04 MED ORDER — SODIUM CHLORIDE 0.9 % IV SOLN
Freq: Once | INTRAVENOUS | Status: AC
  Administered 2015-12-04: 15:00:00 via INTRAVENOUS

## 2015-12-04 NOTE — ED Notes (Addendum)
Pt did not take BP med for 2 days

## 2015-12-04 NOTE — ED Notes (Signed)
Pt states she has been having gen abd pain x 2 days.  States that she has been constipated since Friday.  States she has tried enemas with no relief.  Pt has hx of htn and has not taken any of her medicines today.

## 2015-12-04 NOTE — Discharge Instructions (Signed)

## 2015-12-04 NOTE — ED Provider Notes (Signed)
Patient signed out to me by Dr. Rhunette Croftnanavati and patient's abdominal CT without acute findings. Will be treated for her UTI.  Lorre NickAnthony Amyrah Pinkhasov, MD 12/04/15 [phone]

## 2015-12-04 NOTE — ED Notes (Signed)
Pain medication given in Triage. Patient advised about side effects of medications and  to avoid driving for a minimum of 4 hours.  

## 2015-12-04 NOTE — ED Provider Notes (Signed)
CSN: [id number]     Arrival date & time 12/04/15  0935 History   First MD Initiated Contact with Patient 12/04/15 1121     Chief Complaint  Patient presents with  . Abdominal Pain  . Headache     (Consider location/radiation/quality/duration/timing/severity/associated sxs/prior Treatment) HPI Comments: Pt comes in with cc of abd pain and headaches. PT has hx of HTN. She is s/p tubal ligation and renal stone surgery. Abd pain x 2-3, constant now for the last 24-36 hours. Pt describes the pain has sharp, anterior, starts at the epigastrium and moves to the suprapubic region. Pt thinks pain is worse with food, so she hasnt eaten anything in the last 24 hours. + nausea, constipation - last BM was Friday, + flatus.  No vaginal discharge, bleeding, no dysuria, hematuria, but + polyuria. Pt has had some BRBPR over the past few months. She has hx of hemorrhoids. No tarry stools. No hx pf pelvic disorders.   ROS 10 Systems reviewed and are negative for acute change except as noted in the HPI.      Patient is a 55 y.o. female presenting with abdominal pain and headaches. The history is provided by the patient.  Abdominal Pain Headache Associated symptoms: abdominal pain     Past Medical History  Diagnosis Date  . Allergy   . Hypertension    Past Surgical History  Procedure Laterality Date  . Tubal ligation    . Kidney surgery Right     pt states she had blockage   Family History  Problem Relation Age of Onset  . Cancer Mother   . Heart disease Father   . Diabetes Sister   . Diabetes Brother   . Diabetes Sister   . Diabetes Sister   . Diabetes Sister    Social History  Substance Use Topics  . Smoking status: Current Every Day Smoker -- 0.50 packs/day for 30 years    Types: Cigarettes  . Smokeless tobacco: None  . Alcohol Use: Yes     Comment: special occasions    OB History    No data available     Review of Systems  Gastrointestinal: Positive for abdominal pain.   Neurological: Positive for headaches.      Allergies  Codeine and Morphine and related  Home Medications   Prior to Admission medications   Medication Sig Start Date End Date Taking? Authorizing Provider  amoxicillin-clavulanate (AUGMENTIN) 875-125 MG tablet Take 1 tablet by mouth 2 (two) times daily. Started 02/27 for 7 days 11/27/15  Yes Historical Provider, MD  benzonatate (TESSALON) 100 MG capsule Take 1 capsule by mouth 2 (two) times daily. 11/27/15  Yes Historical Provider, MD  lisinopril-hydrochlorothiazide (PRINZIDE,ZESTORETIC) 20-12.5 MG per tablet Take 1 tablet by mouth daily. 05/03/14  Yes Shade Flood, MD  naproxen sodium (ANAPROX) 220 MG tablet Take 220 mg by mouth every 12 (twelve) hours as needed (for pain).   Yes Historical Provider, MD   BP 173/103 mmHg  Pulse 80  Temp(Src) 98.7 F (37.1 C) (Oral)  Resp 27  Ht 5' (1.524 m)  Wt 165 lb (74.844 kg)  BMI 32.22 kg/m2  SpO2 96% Physical Exam  ED Course  Procedures (including critical care time) Labs Review Labs Reviewed  COMPREHENSIVE METABOLIC PANEL - Abnormal; Notable for the following:    Glucose, Bld 137 (*)    All other components within normal limits  CBC - Abnormal; Notable for the following:    RBC 5.47 (*)  Hemoglobin 15.5 (*)    HCT 47.6 (*)    All other components within normal limits  URINALYSIS, ROUTINE W REFLEX MICROSCOPIC (NOT [facility]) - Abnormal; Notable for the following:    Color, Urine AMBER (*)    APPearance CLOUDY (*)    Hgb urine dipstick TRACE (*)    Bilirubin Urine SMALL (*)    Protein, ur 100 (*)    Leukocytes, UA SMALL (*)    All other components within normal limits  URINE MICROSCOPIC-ADD ON - Abnormal; Notable for the following:    Squamous Epithelial / LPF 6-30 (*)    Bacteria, UA MANY (*)    Casts HYALINE CASTS (*)    All other components within normal limits  LIPASE, BLOOD    Imaging Review [place] Abdomen Limited  12/04/2015  CLINICAL DATA:  Epigastric pain. EXAM: US  ABDOMEN LIMITED - RIGHT UPPER QUADRANT COMPARISON:  12/16/2006. FINDINGS: Gallbladder: No gallstones or wall thickening visualized. No sonographic Murphy sign noted by sonographer. Common bile duct: Diameter: 2.2 mm Liver: There is echogenic consistent fatty infiltration and/or hepatocellular disease. Focal area of relative decreased echogenicity noted adjacent to the gallbladder fossa most likely focal area of fatty sparing. IMPRESSION: 1. Echogenic liver suggesting fatty infiltration. Focal air relative decreased echogenicity noted adjacent to the gallbladder fossa most likely focal fatty sparing. 2.  No gallstones.  No biliary distention. Electronically Signed   By: Maisie Fushomas  Register   On: 12/04/2015 13:54   I have personally reviewed and evaluated these images and lab results as part of my medical decision-making.   EKG Interpretation None      MDM   Final diagnoses:  Epigastric abdominal tenderness  Generalized abdominal discomfort  UTI (lower urinary tract infection)    PT comes in with cc of abd pain. Pt has epigastric abd pain, generalized lower abd pain that moves to the back. She has no uti like symptoms - although UA has +++ WBCs and bacteria. Pt reports pain worse with po intake, US ordered, and is neg for chollithiasis.  Pt has no UTI like symptoms, she is not immunosuppressed, has generalized tenderness - will get CT scan to ensure there is no diverticulitis. She has been informed that if CT is neg, she will get antibiotics for UTI and will need to start taking OTC PPI.     Derwood KaplanAnkit Kentravious Lipford, MD 12/04/15 1601

## 2015-12-04 NOTE — ED Notes (Signed)
Patient transported to CT 

## 2015-12-04 NOTE — ED Notes (Signed)
MD at bedside. EDP PRESENT 

## 2015-12-04 NOTE — ED Notes (Signed)
Ultrasound at BS.

## 2016-07-15 ENCOUNTER — Emergency Department (HOSPITAL_COMMUNITY): Payer: Managed Care, Other (non HMO)

## 2016-07-15 ENCOUNTER — Emergency Department (HOSPITAL_COMMUNITY)
Admission: EM | Admit: 2016-07-15 | Discharge: 2016-07-16 | Disposition: A | Discharge status: Home / Self Care | Payer: Managed Care, Other (non HMO) | Attending: Emergency Medicine | Admitting: Emergency Medicine

## 2016-07-15 ENCOUNTER — Encounter (HOSPITAL_COMMUNITY): Payer: Self-pay | Admitting: Emergency Medicine

## 2016-07-15 DIAGNOSIS — Z79899 Other long term (current) drug therapy: Secondary | ICD-10-CM | POA: Insufficient documentation

## 2016-07-15 DIAGNOSIS — R059 Cough, unspecified: Secondary | ICD-10-CM

## 2016-07-15 DIAGNOSIS — R05 Cough: Secondary | ICD-10-CM | POA: Insufficient documentation

## 2016-07-15 DIAGNOSIS — I10 Essential (primary) hypertension: Secondary | ICD-10-CM | POA: Diagnosis present

## 2016-07-15 DIAGNOSIS — R0602 Shortness of breath: Secondary | ICD-10-CM | POA: Diagnosis not present

## 2016-07-15 DIAGNOSIS — F17210 Nicotine dependence, cigarettes, uncomplicated: Secondary | ICD-10-CM | POA: Diagnosis not present

## 2016-07-15 LAB — CBC WITH DIFFERENTIAL/PLATELET
BASOS ABS: 0 10*3/uL (ref 0.0–0.1)
Basophils Relative: 0 %
EOS ABS: 0.2 10*3/uL (ref 0.0–0.7)
EOS PCT: 2 %
HCT: 41.9 % (ref 36.0–46.0)
HEMOGLOBIN: 13.8 g/dL (ref 12.0–15.0)
LYMPHS ABS: 2.2 10*3/uL (ref 0.7–4.0)
LYMPHS PCT: 29 %
MCH: 28.2 pg (ref 26.0–34.0)
MCHC: 32.9 g/dL (ref 30.0–36.0)
MCV: 85.7 fL (ref 78.0–100.0)
Monocytes Absolute: 0.6 10*3/uL (ref 0.1–1.0)
Monocytes Relative: 8 %
NEUTROS PCT: 61 %
Neutro Abs: 4.5 10*3/uL (ref 1.7–7.7)
PLATELETS: 192 10*3/uL (ref 150–400)
RBC: 4.89 MIL/uL (ref 3.87–5.11)
RDW: 15.1 % (ref 11.5–15.5)
WBC: 7.5 10*3/uL (ref 4.0–10.5)

## 2016-07-15 LAB — I-STAT TROPONIN, ED: TROPONIN I, POC: 0.01 ng/mL (ref 0.00–0.08)

## 2016-07-15 MED ORDER — LABETALOL HCL 5 MG/ML IV SOLN
20.0000 mg | Freq: Once | INTRAVENOUS | Status: AC
  Administered 2016-07-15: 20 mg via INTRAVENOUS
  Filled 2016-07-15: qty 4

## 2016-07-15 NOTE — ED Provider Notes (Signed)
[facility]-EMERGENCY DEPT Provider Note   CSN: [id number] Arrival date & time: 07/15/16  1740     History   Chief Complaint Chief Complaint  Patient presents with  . Shortness of Breath  . Nasal Congestion  . Hypertension    HPI Debbie Davis is a 55 y.o. female.  The history is provided by the patient.  Shortness of Breath  This is a new problem. The current episode started more than 1 week ago. The problem has been gradually worsening. Associated symptoms include headaches, rhinorrhea, cough and sputum production. Pertinent negatives include no fever and no leg swelling. Risk factors include smoking. Treatments tried: decongestants. The treatment provided mild relief. Associated medical issues do not include asthma or COPD (unlikely diagnosed; 30 pk yr history). Associated medical issues comments: recently had influenza.    Past Medical History:  Diagnosis Date  . Allergy   . Hypertension     There are no active problems to display for this patient.   Past Surgical History:  Procedure Laterality Date  . KIDNEY SURGERY Right    pt states she had blockage  . TUBAL LIGATION      OB History    No data available       Home Medications    Prior to Admission medications   Medication Sig Start Date End Date Taking? Authorizing Provider  Multiple Vitamin (MULTIVITAMIN WITH MINERALS) TABS tablet Take 1 tablet by mouth daily.   Yes Historical Provider, MD  cephALEXin (KEFLEX) 500 MG capsule Take 1 capsule (500 mg total) by mouth 3 (three) times daily. Patient not taking: Reported on 07/15/2016 12/04/15   Lorre Nick, MD  lisinopril-hydrochlorothiazide (PRINZIDE,ZESTORETIC) 20-12.5 MG tablet Take 1 tablet by mouth daily. 07/16/16 08/15/16  Nira Conn, MD    Family History Family History  Problem Relation Age of Onset  . Cancer Mother   . Heart disease Father   . Diabetes Sister   . Diabetes Brother   . Diabetes Sister   . Diabetes Sister   . Diabetes  Sister     Social History Social History  Substance Use Topics  . Smoking status: Current Every Day Smoker    Packs/day: 0.50    Years: 30.00    Types: Cigarettes  . Smokeless tobacco: Not on file  . Alcohol use Yes     Comment: special occasions      Allergies   Codeine and Morphine and related   Review of Systems Review of Systems  Constitutional: Negative for fever.  HENT: Positive for rhinorrhea.   Respiratory: Positive for cough, sputum production and shortness of breath.   Cardiovascular: Negative for leg swelling.  Neurological: Positive for headaches.     Physical Exam Updated Vital Signs BP (!) 224/141 (BP Location: Left Arm)   Pulse 116   Temp 98.8 F (37.1 C) (Oral)   Resp 17   Ht 5' (1.524 m)   Wt 177 lb (80.3 kg)   SpO2 98%   BMI 34.57 kg/m   Physical Exam  Constitutional: She is oriented to person, place, and time. She appears well-developed and well-nourished. No distress.  HENT:  Head: Normocephalic and atraumatic.  Nose: Nose normal.  Eyes: Conjunctivae and EOM are normal. Pupils are equal, round, and reactive to light. Right eye exhibits no discharge. Left eye exhibits no discharge. No scleral icterus.  Neck: Normal range of motion. Neck supple.  Cardiovascular: Normal rate and regular rhythm.  Exam reveals no gallop and no friction rub.  No murmur heard. Pulmonary/Chest: Effort normal and breath sounds normal. No stridor. No respiratory distress. She has no rales.  Abdominal: Soft. She exhibits no distension. There is no tenderness.  Musculoskeletal: She exhibits no edema or tenderness.  Neurological: She is alert and oriented to person, place, and time.  Mental Status: Alert and oriented to person, place, and time. Attention and concentration normal. Speech clear. Recent memory is intac  Cranial Nerves  II Visual Fields: Intact to confrontation. Visual fields intact. III, IV, VI: Pupils equal and reactive to light and near. Full eye  movement without nystagmus  V Facial Sensation: Normal. No weakness of masticatory muscles  VII: No facial weakness or asymmetry  VIII Auditory Acuity: Grossly normal  IX/X: The uvula is midline; the palate elevates symmetrically  XI: Normal sternocleidomastoid and trapezius strength  XII: The tongue is midline. No atrophy or fasciculations.   Motor System: Muscle Strength: 5/5 and symmetric in the upper and lower extremities. No pronation or drift.  Muscle Tone: Tone and muscle bulk are normal in the upper and lower extremities.   Reflexes: DTRs: 2+ and symmetrical in all four extremities. Plantar responses are flexor bilaterally.  Coordination: Intact finger-to-nose. No tremor.  Sensation: Intact to light touch.   Skin: Skin is warm and dry. No rash noted. She is not diaphoretic. No erythema.  Psychiatric: She has a normal mood and affect.  Vitals reviewed.    ED Treatments / Results  Labs (all labs ordered are listed, but only abnormal results are displayed) Labs Reviewed  COMPREHENSIVE METABOLIC PANEL - Abnormal; Notable for the following:       Result Value   Glucose, Bld 124 (*)    All other components within normal limits  CBC WITH DIFFERENTIAL/PLATELET  BRAIN NATRIURETIC PEPTIDE  I-STAT TROPOININ, ED    EKG  EKG Interpretation  Date/Time:  Monday July 15 2016 18:17:24 EDT Ventricular Rate:  114 PR Interval:    QRS Duration: 76 QT Interval:  340 QTC Calculation: 469 R Axis:   28 Text Interpretation:  Sinus tachycardia Consider right atrial enlargement Anteroseptal infarct, old Baseline wander in lead(s) V3 No old tracing to compare Confirmed by [facility] MD, Garland Hincapie ([id number]) on 07/16/2016 1:57:53 AM       Radiology Dg Chest 2 View  Result Date: 07/15/2016 CLINICAL DATA:  Acute onset of shortness of breath and productive cough. Fever and headache. Initial encounter. EXAM: CHEST  2 VIEW COMPARISON:  Chest radiograph performed 09/28/2013 FINDINGS: The lungs are  well-aerated. Mild vascular congestion is noted. Increased interstitial markings could reflect minimal interstitial edema, given the patient's symptoms. There is no evidence of pleural effusion or pneumothorax. The heart is borderline normal in size. No acute osseous abnormalities are seen. IMPRESSION: Mild vascular congestion noted. Increased interstitial markings could reflect minimal interstitial edema, given the patient's symptoms. Electronically Signed   By: Roanna RaiderJeffery  Chang M.D.   On: 07/15/2016 18:57    Procedures Procedures (including critical care time)  Medications Ordered in ED Medications  labetalol (NORMODYNE,TRANDATE) injection 20 mg (20 mg Intravenous Given 07/15/16 2321)  magnesium sulfate IVPB 2 g 50 mL (0 g Intravenous Stopped 07/16/16 0128)     Initial Impression / Assessment and Plan / ED Course  I have reviewed the triage vital signs and the nursing notes.  Pertinent labs & imaging results that were available during my care of the patient were reviewed by me and considered in my medical decision making (see chart for details).  Clinical Course  1. SOB With cough. Recent influenza. Chest x-ray without evidence of pneumonia. Likely bronchitis. However chest x-ray did also no evidence of pulmonary vascular congestion. Otherwise patient has not had evidence of volume overload on exam. BNP is within normal limits.  2. HTN With associated headache, SOB, orthopnea. No chest pain, vision changes, or focal weakness. Has not taken BP meds for 6 months. Exam nonfocal. Headache likely secondary to hypertension. Patient given labetalol and magnesium which improved the patient's blood pressure and headache. Labs without evidence of acute end organ damage. Patient reports that she does have a primary care provider. She was given prescription for her home blood pressure medicine and instructed to follow up closely with her primary care provider for continued management of her  hypertension.   07/16/16 0000 07/16/16 0030 07/16/16 0100  BP: (!) 167/115 (!) 188/133 179/99  Pulse: 96 99 103  Resp: 15 16 18   Temp:     TempSrc:     SpO2: 99% 95% 97%  Weight:     Height:       Final Clinical Impressions(s) / ED Diagnoses   Final diagnoses:  Hypertension, unspecified type  Shortness of breath  Cough   Disposition: Discharge  Condition: Good  I have discussed the results, Dx and Tx plan with the patient who expressed understanding and agree(s) with the plan. Discharge instructions discussed at great length. The patient was given strict return precautions who verbalized understanding of the instructions. No further questions at time of discharge.    Discharge Medication List as of 07/16/2016  2:00 AM      Follow Up: Primary Care Provider  Schedule an appointment as soon as possible for a visit  in 3-5 days for close follow up to assess for blood pressure control      Nira Conn, MD 07/16/16 [phone]

## 2016-07-15 NOTE — ED Triage Notes (Signed)
Pt c/o SOB x 3 days. Pt sts it gets worse with movement. Pt denies chest pain, dizziness. Pt sts that she has also been congested which makes it hard to breathe at night. Pt c/o dry cough x 3 days which causes a pain in her side. Pt A&Ox4 and ambulatory. No history of asthma or cardiac issues.

## 2016-07-16 LAB — COMPREHENSIVE METABOLIC PANEL
ALT: 37 U/L (ref 14–54)
AST: 38 U/L (ref 15–41)
Albumin: 3.9 g/dL (ref 3.5–5.0)
Alkaline Phosphatase: 78 U/L (ref 38–126)
Anion gap: 8 (ref 5–15)
BILIRUBIN TOTAL: 0.6 mg/dL (ref 0.3–1.2)
BUN: 16 mg/dL (ref 6–20)
CO2: 31 mmol/L (ref 22–32)
Calcium: 9.6 mg/dL (ref 8.9–10.3)
Chloride: 104 mmol/L (ref 101–111)
Creatinine, Ser: 0.97 mg/dL (ref 0.44–1.00)
Glucose, Bld: 124 mg/dL — ABNORMAL HIGH (ref 65–99)
POTASSIUM: 3.7 mmol/L (ref 3.5–5.1)
Sodium: 143 mmol/L (ref 135–145)
TOTAL PROTEIN: 7.9 g/dL (ref 6.5–8.1)

## 2016-07-16 LAB — BRAIN NATRIURETIC PEPTIDE: B NATRIURETIC PEPTIDE 5: 32.5 pg/mL (ref 0.0–100.0)

## 2016-07-16 MED ORDER — LISINOPRIL-HYDROCHLOROTHIAZIDE 20-12.5 MG PO TABS: 1.0000 | ORAL_TABLET | Freq: Every day | ORAL | 0 refills | Status: DC

## 2016-07-16 MED ORDER — MAGNESIUM SULFATE 2 GM/50ML IV SOLN
2.0000 g | Freq: Once | INTRAVENOUS | Status: AC
  Administered 2016-07-16: 2 g via INTRAVENOUS
  Filled 2016-07-16: qty 50

## 2016-07-16 NOTE — ED Notes (Signed)
Pt given ginger ale.

## 2016-08-12 ENCOUNTER — Emergency Department (HOSPITAL_COMMUNITY): Payer: Managed Care, Other (non HMO)

## 2016-08-12 ENCOUNTER — Encounter (HOSPITAL_COMMUNITY): Payer: Self-pay | Admitting: Emergency Medicine

## 2016-08-12 ENCOUNTER — Emergency Department (HOSPITAL_COMMUNITY)
Admission: EM | Admit: 2016-08-12 | Discharge: 2016-08-12 | Disposition: A | Discharge status: Home / Self Care | Payer: Managed Care, Other (non HMO) | Attending: Emergency Medicine | Admitting: Emergency Medicine

## 2016-08-12 DIAGNOSIS — Z79899 Other long term (current) drug therapy: Secondary | ICD-10-CM | POA: Insufficient documentation

## 2016-08-12 DIAGNOSIS — K625 Hemorrhage of anus and rectum: Secondary | ICD-10-CM | POA: Diagnosis present

## 2016-08-12 DIAGNOSIS — K644 Residual hemorrhoidal skin tags: Secondary | ICD-10-CM | POA: Diagnosis not present

## 2016-08-12 DIAGNOSIS — I10 Essential (primary) hypertension: Secondary | ICD-10-CM | POA: Insufficient documentation

## 2016-08-12 DIAGNOSIS — F17210 Nicotine dependence, cigarettes, uncomplicated: Secondary | ICD-10-CM | POA: Diagnosis not present

## 2016-08-12 DIAGNOSIS — R0602 Shortness of breath: Secondary | ICD-10-CM | POA: Diagnosis not present

## 2016-08-12 LAB — COMPREHENSIVE METABOLIC PANEL
ALBUMIN: 3.6 g/dL (ref 3.5–5.0)
ALT: 35 U/L (ref 14–54)
AST: 30 U/L (ref 15–41)
Alkaline Phosphatase: 88 U/L (ref 38–126)
Anion gap: 8 (ref 5–15)
BUN: 17 mg/dL (ref 6–20)
CHLORIDE: 105 mmol/L (ref 101–111)
CO2: 25 mmol/L (ref 22–32)
Calcium: 9.3 mg/dL (ref 8.9–10.3)
Creatinine, Ser: 1 mg/dL (ref 0.44–1.00)
GFR calc Af Amer: >60 mL/min (ref >60)
GLUCOSE: 138 mg/dL — AB (ref 65–99)
POTASSIUM: 3.6 mmol/L (ref 3.5–5.1)
SODIUM: 138 mmol/L (ref 135–145)
Total Bilirubin: 0.5 mg/dL (ref 0.3–1.2)
Total Protein: 7.1 g/dL (ref 6.5–8.1)

## 2016-08-12 LAB — CBC
HEMATOCRIT: 40 % (ref 36.0–46.0)
Hemoglobin: 12.9 g/dL (ref 12.0–15.0)
MCH: 28 pg (ref 26.0–34.0)
MCHC: 32.3 g/dL (ref 30.0–36.0)
MCV: 86.8 fL (ref 78.0–100.0)
Platelets: 198 10*3/uL (ref 150–400)
RBC: 4.61 MIL/uL (ref 3.87–5.11)
RDW: 15.2 % (ref 11.5–15.5)
WBC: 7.1 10*3/uL (ref 4.0–10.5)

## 2016-08-12 LAB — BRAIN NATRIURETIC PEPTIDE: B Natriuretic Peptide: 14.9 pg/mL (ref 0.0–100.0)

## 2016-08-12 LAB — TYPE AND SCREEN
ABO/RH(D): O POS
Antibody Screen: NEGATIVE

## 2016-08-12 LAB — ABO/RH: ABO/RH(D): O POS

## 2016-08-12 LAB — D-DIMER, QUANTITATIVE (NOT AT ARMC): D DIMER QUANT: 0.32 ug{FEU}/mL (ref 0.00–0.50)

## 2016-08-12 MED ORDER — HYDROCORTISONE ACETATE 25 MG RE SUPP: 25.0000 mg | Freq: Two times a day (BID) | RECTAL | 0 refills | Status: DC

## 2016-08-12 MED ORDER — PREDNISONE 20 MG PO TABS: 40.0000 mg | ORAL_TABLET | Freq: Every day | ORAL | 0 refills | Status: DC

## 2016-08-12 MED ORDER — ALBUTEROL SULFATE HFA 108 (90 BASE) MCG/ACT IN AERS: 1.0000 | INHALATION_SPRAY | RESPIRATORY_TRACT | 0 refills | Status: AC | PRN

## 2016-08-12 MED ORDER — IPRATROPIUM-ALBUTEROL 0.5-2.5 (3) MG/3ML IN SOLN
3.0000 mL | Freq: Once | RESPIRATORY_TRACT | Status: AC
  Administered 2016-08-12: 3 mL via RESPIRATORY_TRACT
  Filled 2016-08-12: qty 3

## 2016-08-12 NOTE — ED Triage Notes (Signed)
Pt reports SOB, generalized weakness, and bilateral leg swelling for the past month. Pt reports SOB has been unchanged since last visit and causing trouble sleeping. Pt also reports blood in her stools for the last week. Hx of hemorrhoids.

## 2016-08-12 NOTE — Discharge Instructions (Signed)
Start using albuterol every 4 hours for shortness of breath.  Use the Anusol for your hemorrhoids.  Take prednisone for the next 3 days to help with your shortness of breath.  Follow up with your primary care physician within the week.  Return to the ED for sudden worsening shortness of breath, chest pain, fever, worsening cough, or any new or concerning symptoms.

## 2016-08-12 NOTE — Progress Notes (Signed)
Pt states her pcp is "[facility] up [place]" EPIC updated

## 2016-08-12 NOTE — ED Provider Notes (Signed)
[facility]-EMERGENCY DEPT Provider Note   CSN: [id number] Arrival date & time: 08/12/16  1026     History   Chief Complaint Chief Complaint  Patient presents with  . Shortness of Breath  . Leg Swelling  . Rectal Bleeding    HPI Debbie Davis is a 55 y.o. female.  HPI Debbie Davis is a 55 y.o. female with PMH significant for HTN on prinzide who presents with 1 month history of shortness of breath, cough with sputum production, bilateral lower extremity swelling, and rectal bleeding.  Rectal bleeding is bright red blood only when wiping.  No melena or hematochezia.  No abdominal pain.  Patient was seen in ED 10/16 for the same shortness of breath and she states it has been ongoing since that time.  She states she was unable to follow up with her PCP.  She denies any CP or fever.  She states she feels generally fatigued all the time, but worse with exertion.  She has been compliant with her medications.  Nothing makes her symptoms better.  She has a 30 pack year history, does not smoke now.  No cardiac history.  Past Medical History:  Diagnosis Date  . Allergy   . Hypertension     There are no active problems to display for this patient.   Past Surgical History:  Procedure Laterality Date  . KIDNEY SURGERY Right    pt states she had blockage  . TUBAL LIGATION      OB History    No data available       Home Medications    Prior to Admission medications   Medication Sig Start Date End Date Taking? Authorizing Provider  lisinopril-hydrochlorothiazide (PRINZIDE,ZESTORETIC) 20-12.5 MG tablet Take 1 tablet by mouth daily. Patient taking differently: Take 1 tablet by mouth every morning.  07/16/16 08/15/16 Yes Nira Conn, MD  Multiple Vitamin (MULTIVITAMIN WITH MINERALS) TABS tablet Take 1 tablet by mouth daily.   Yes Historical Provider, MD  naproxen sodium (ANAPROX) 220 MG tablet Take 220 mg by mouth every 12 (twelve) hours as needed (pain).   Yes Historical  Provider, MD  albuterol (PROVENTIL HFA;VENTOLIN HFA) 108 (90 Base) MCG/ACT inhaler Inhale 1-2 puffs into the lungs every 4 (four) hours as needed for wheezing or shortness of breath. 08/12/16   Cheri Fowler, PA-C  hydrocortisone (ANUSOL-HC) 25 MG suppository Place 1 suppository (25 mg total) rectally 2 (two) times daily. 08/12/16   Cheri Fowler, PA-C  predniSONE (DELTASONE) 20 MG tablet Take 2 tablets (40 mg total) by mouth daily. 08/12/16   Cheri Fowler, PA-C    Family History Family History  Problem Relation Age of Onset  . Cancer Mother   . Heart disease Father   . Diabetes Sister   . Diabetes Brother   . Diabetes Sister   . Diabetes Sister   . Diabetes Sister     Social History Social History  Substance Use Topics  . Smoking status: Current Every Day Smoker    Packs/day: 0.50    Years: 30.00    Types: Cigarettes  . Smokeless tobacco: Not on file  . Alcohol use Yes     Comment: special occasions      Allergies   Codeine and Morphine and related   Review of Systems Review of Systems All other systems negative unless otherwise stated in HPI   Physical Exam Updated Vital Signs BP 160/100 (BP Location: Left Arm)   Pulse 99   Temp 99.1 F (37.3  C) (Oral)   Resp 16   SpO2 98%   Physical Exam  Constitutional: She is oriented to person, place, and time. She appears well-developed and well-nourished.  Non-toxic appearance. She does not have a sickly appearance. She does not appear ill.  Obese female.   HENT:  Head: Normocephalic and atraumatic.  Mouth/Throat: Oropharynx is clear and moist.  Eyes: Conjunctivae are normal.  Neck: Normal range of motion. Neck supple.  Cardiovascular: Normal rate and regular rhythm.   2+ pitting edema bilaterally to mid shin.   Pulmonary/Chest: Effort normal and breath sounds normal. No accessory muscle usage or stridor. No respiratory distress. She has no wheezes. She has no rhonchi. She has no rales.  Abdominal: Soft. Bowel sounds are  normal. She exhibits no distension. There is no tenderness.  Genitourinary: Rectal exam shows external hemorrhoid and tenderness. Rectal exam shows no fissure.  Genitourinary Comments: Chaperone present during exam.  Non-thrombosed external hemorrhoids.   Musculoskeletal: Normal range of motion.  Lymphadenopathy:    She has no cervical adenopathy.  Neurological: She is alert and oriented to person, place, and time.  Speech clear without dysarthria.  Skin: Skin is warm and dry.  Psychiatric: She has a normal mood and affect. Her behavior is normal.     ED Treatments / Results  Labs (all labs ordered are listed, but only abnormal results are displayed) Labs Reviewed  COMPREHENSIVE METABOLIC PANEL - Abnormal; Notable for the following:       Result Value   Glucose, Bld 138 (*)    All other components within normal limits  CBC  BRAIN NATRIURETIC PEPTIDE  D-DIMER, QUANTITATIVE (NOT AT [facility])  TYPE AND SCREEN  ABO/RH    EKG  EKG Interpretation  Date/Time:  Monday August 12 2016 10:37:01 EST Ventricular Rate:  112 PR Interval:    QRS Duration: 80 QT Interval:  334 QTC Calculation: 456 R Axis:   38 Text Interpretation:  Sinus tachycardia Biatrial enlargement Anteroseptal infarct, old No significant change since last tracing Confirmed by [facility] MD, PEDRO [id number]) on 08/12/2016 10:45:16 AM Also confirmed by Eudelia BunchARDAMA MD, PEDRO [phone]), editor [place], Jola BabinskiMarilyn [phone])  on 08/12/2016 11:22:17 AM       Radiology Dg Chest 2 View  Result Date: 08/12/2016 CLINICAL DATA:  C/o sob and cough x 1 month. Hx htn EXAM: CHEST - 2 VIEW COMPARISON:  07/15/2016 FINDINGS: Lungs are clear. Heart size and mediastinal contours are within normal limits. Patchy aortic calcifications. No effusion. Visualized bones unremarkable. IMPRESSION: 1. No acute findings. 2.  Aortic Atherosclerosis (ICD10-170.0) Electronically Signed   By: Corlis Leak  Hassell M.D.   On: 08/12/2016 11:42    Procedures Procedures  (including critical care time)  Medications Ordered in ED Medications  ipratropium-albuterol (DUONEB) 0.5-2.5 (3) MG/3ML nebulizer solution 3 mL (3 mLs Nebulization Given 08/12/16 1321)     Initial Impression / Assessment and Plan / ED Course  I have reviewed the triage vital signs and the nursing notes.  Pertinent labs & imaging results that were available during my care of the patient were reviewed by me and considered in my medical decision making (see chart for details).  Clinical Course    Patient presents with continued shortness of breath, cough, bilateral lower extremity swelling.  Ongoing x 1 month.  Worsens with exertion.  Physical exam as above.  EKG appears unchanged from previous.  Labs without acute abnormalities, including dimer and BNP.  Doubt ACS given unchanged EKG and normal troponin.  Patient feels improved  after duoneb.  Possible COPD aspect given 30 pack year history.  Will give albuterol and 3 day prednisone burst.  No fevers, no indication for abx.  Non-thrombosed external hemhorroids will be treated with anusol.  Discussed PCP follow up.  Return precautions discussed.  Case has been discussed with Dr. Madilyn Hookees who agrees with the above plan for discharge.   Final Clinical Impressions(s) / ED Diagnoses   Final diagnoses:  External hemorrhoids  Shortness of breath    New Prescriptions New Prescriptions   ALBUTEROL (PROVENTIL HFA;VENTOLIN HFA) 108 (90 BASE) MCG/ACT INHALER    Inhale 1-2 puffs into the lungs every 4 (four) hours as needed for wheezing or shortness of breath.   HYDROCORTISONE (ANUSOL-HC) 25 MG SUPPOSITORY    Place 1 suppository (25 mg total) rectally 2 (two) times daily.   PREDNISONE (DELTASONE) 20 MG TABLET    Take 2 tablets (40 mg total) by mouth daily.     Cheri FowlerKayla Thomas Rhude, PA-C 08/12/16 1521    Tilden FossaElizabeth Rees, MD 08/14/16 [phone]

## 2016-08-20 ENCOUNTER — Emergency Department (HOSPITAL_COMMUNITY): Payer: Managed Care, Other (non HMO)

## 2016-08-20 ENCOUNTER — Encounter (HOSPITAL_COMMUNITY): Payer: Self-pay | Admitting: Emergency Medicine

## 2016-08-20 ENCOUNTER — Inpatient Hospital Stay (HOSPITAL_COMMUNITY)
Admission: EM | Admit: 2016-08-20 | Discharge: 2016-08-24 | DRG: 304 | Disposition: A | Discharge status: Home / Self Care | Payer: Managed Care, Other (non HMO) | Attending: Internal Medicine | Admitting: Internal Medicine

## 2016-08-20 DIAGNOSIS — Z833 Family history of diabetes mellitus: Secondary | ICD-10-CM

## 2016-08-20 DIAGNOSIS — IMO0002 Reserved for concepts with insufficient information to code with codable children: Secondary | ICD-10-CM

## 2016-08-20 DIAGNOSIS — Z8249 Family history of ischemic heart disease and other diseases of the circulatory system: Secondary | ICD-10-CM

## 2016-08-20 DIAGNOSIS — Z9114 Patient's other noncompliance with medication regimen: Secondary | ICD-10-CM

## 2016-08-20 DIAGNOSIS — R0602 Shortness of breath: Secondary | ICD-10-CM

## 2016-08-20 DIAGNOSIS — R9431 Abnormal electrocardiogram [ECG] [EKG]: Secondary | ICD-10-CM

## 2016-08-20 DIAGNOSIS — E1165 Type 2 diabetes mellitus with hyperglycemia: Secondary | ICD-10-CM | POA: Diagnosis present

## 2016-08-20 DIAGNOSIS — I5031 Acute diastolic (congestive) heart failure: Secondary | ICD-10-CM

## 2016-08-20 DIAGNOSIS — E118 Type 2 diabetes mellitus with unspecified complications: Secondary | ICD-10-CM | POA: Diagnosis not present

## 2016-08-20 DIAGNOSIS — I5033 Acute on chronic diastolic (congestive) heart failure: Secondary | ICD-10-CM | POA: Diagnosis present

## 2016-08-20 DIAGNOSIS — I119 Hypertensive heart disease without heart failure: Secondary | ICD-10-CM | POA: Diagnosis present

## 2016-08-20 DIAGNOSIS — I161 Hypertensive emergency: Principal | ICD-10-CM | POA: Diagnosis present

## 2016-08-20 DIAGNOSIS — R590 Localized enlarged lymph nodes: Secondary | ICD-10-CM | POA: Diagnosis present

## 2016-08-20 DIAGNOSIS — F17210 Nicotine dependence, cigarettes, uncomplicated: Secondary | ICD-10-CM | POA: Diagnosis present

## 2016-08-20 DIAGNOSIS — Z6839 Body mass index (BMI) 39.0-39.9, adult: Secondary | ICD-10-CM | POA: Diagnosis not present

## 2016-08-20 DIAGNOSIS — R000 Tachycardia, unspecified: Secondary | ICD-10-CM

## 2016-08-20 DIAGNOSIS — J449 Chronic obstructive pulmonary disease, unspecified: Secondary | ICD-10-CM | POA: Diagnosis present

## 2016-08-20 DIAGNOSIS — I110 Hypertensive heart disease with heart failure: Secondary | ICD-10-CM | POA: Diagnosis present

## 2016-08-20 DIAGNOSIS — Z794 Long term (current) use of insulin: Secondary | ICD-10-CM

## 2016-08-20 DIAGNOSIS — J9601 Acute respiratory failure with hypoxia: Secondary | ICD-10-CM | POA: Diagnosis present

## 2016-08-20 DIAGNOSIS — I160 Hypertensive urgency: Secondary | ICD-10-CM

## 2016-08-20 DIAGNOSIS — M79609 Pain in unspecified limb: Secondary | ICD-10-CM | POA: Diagnosis not present

## 2016-08-20 DIAGNOSIS — Z79899 Other long term (current) drug therapy: Secondary | ICD-10-CM

## 2016-08-20 HISTORY — DX: Type 2 diabetes mellitus with hyperglycemia: E11.65

## 2016-08-20 HISTORY — DX: Hypertensive heart disease without heart failure: I11.9

## 2016-08-20 LAB — CREATININE, SERUM: Creatinine, Ser: 1.02 mg/dL — ABNORMAL HIGH (ref 0.44–1.00)

## 2016-08-20 LAB — CBC
HCT: 38.8 % (ref 36.0–46.0)
HEMATOCRIT: 39.3 % (ref 36.0–46.0)
HEMOGLOBIN: 12.4 g/dL (ref 12.0–15.0)
Hemoglobin: 12.1 g/dL (ref 12.0–15.0)
MCH: 27.3 pg (ref 26.0–34.0)
MCH: 27.3 pg (ref 26.0–34.0)
MCHC: 31.2 g/dL (ref 30.0–36.0)
MCHC: 31.6 g/dL (ref 30.0–36.0)
MCV: 87.4 fL (ref 78.0–100.0)
MCV: 86.4 fL (ref 78.0–100.0)
PLATELETS: 212 10*3/uL (ref 150–400)
Platelets: 207 10*3/uL (ref 150–400)
RBC: 4.44 MIL/uL (ref 3.87–5.11)
RBC: 4.55 MIL/uL (ref 3.87–5.11)
RDW: 15.2 % (ref 11.5–15.5)
RDW: 15.2 % (ref 11.5–15.5)
WBC: 9.8 10*3/uL (ref 4.0–10.5)
WBC: 10.2 10*3/uL (ref 4.0–10.5)

## 2016-08-20 LAB — BASIC METABOLIC PANEL
ANION GAP: 9 (ref 5–15)
BUN: 16 mg/dL (ref 6–20)
CALCIUM: 8.6 mg/dL — AB (ref 8.9–10.3)
CO2: 26 mmol/L (ref 22–32)
Chloride: 103 mmol/L (ref 101–111)
Creatinine, Ser: 0.89 mg/dL (ref 0.44–1.00)
GFR calc Af Amer: >60 mL/min (ref >60)
GLUCOSE: 128 mg/dL — AB (ref 65–99)
Potassium: 3.1 mmol/L — ABNORMAL LOW (ref 3.5–5.1)
SODIUM: 138 mmol/L (ref 135–145)

## 2016-08-20 LAB — I-STAT TROPONIN, ED: Troponin i, poc: 0.01 ng/mL (ref 0.00–0.08)

## 2016-08-20 LAB — D-DIMER, QUANTITATIVE: D-Dimer, Quant: <0.27 ug/mL-FEU (ref 0.00–0.50)

## 2016-08-20 LAB — RAPID URINE DRUG SCREEN, HOSP PERFORMED
Amphetamines: NOT DETECTED
BARBITURATES: NOT DETECTED
BENZODIAZEPINES: NOT DETECTED
COCAINE: NOT DETECTED
Opiates: NOT DETECTED
TETRAHYDROCANNABINOL: NOT DETECTED

## 2016-08-20 LAB — TROPONIN I: Troponin I: <0.03 ng/mL (ref <0.03)

## 2016-08-20 LAB — MAGNESIUM: Magnesium: 2 mg/dL (ref 1.7–2.4)

## 2016-08-20 LAB — TSH
TSH: 1.288 u[IU]/mL (ref 0.350–4.500)
TSH: 0.837 u[IU]/mL (ref 0.350–4.500)

## 2016-08-20 LAB — BRAIN NATRIURETIC PEPTIDE: B NATRIURETIC PEPTIDE 5: 33 pg/mL (ref 0.0–100.0)

## 2016-08-20 LAB — T4, FREE: Free T4: 1.01 ng/dL (ref 0.61–1.12)

## 2016-08-20 MED ORDER — LISINOPRIL-HYDROCHLOROTHIAZIDE 20-12.5 MG PO TABS: 1.0000 | ORAL_TABLET | Freq: Every day | ORAL | Status: DC

## 2016-08-20 MED ORDER — NITROGLYCERIN IN D5W 200-5 MCG/ML-% IV SOLN
0.0000 ug/min | Freq: Once | INTRAVENOUS | Status: AC
  Administered 2016-08-20: 5 ug/min via INTRAVENOUS
  Filled 2016-08-20: qty 250

## 2016-08-20 MED ORDER — ENOXAPARIN SODIUM 40 MG/0.4ML ~~LOC~~ SOLN
40.0000 mg | Freq: Every day | SUBCUTANEOUS | Status: DC
  Administered 2016-08-20 – 2016-08-23 (×4): 40 mg via SUBCUTANEOUS
  Filled 2016-08-20 (×4): qty 0.4

## 2016-08-20 MED ORDER — ACETAMINOPHEN 650 MG RE SUPP: 650.0000 mg | Freq: Four times a day (QID) | RECTAL | Status: DC | PRN

## 2016-08-20 MED ORDER — ONDANSETRON HCL 4 MG/2ML IJ SOLN: 4.0000 mg | Freq: Four times a day (QID) | INTRAMUSCULAR | Status: DC | PRN

## 2016-08-20 MED ORDER — ACETAMINOPHEN 325 MG PO TABS
650.0000 mg | ORAL_TABLET | Freq: Four times a day (QID) | ORAL | Status: DC | PRN
  Administered 2016-08-20 – 2016-08-22 (×3): 650 mg via ORAL
  Filled 2016-08-20 (×3): qty 2

## 2016-08-20 MED ORDER — IOPAMIDOL (ISOVUE-370) INJECTION 76%
INTRAVENOUS | Status: AC
Start: 2016-08-20 — End: 2016-08-20
  Administered 2016-08-20: 80 mL
  Filled 2016-08-20: qty 100

## 2016-08-20 MED ORDER — SODIUM CHLORIDE 0.9% FLUSH
3.0000 mL | Freq: Two times a day (BID) | INTRAVENOUS | Status: DC
  Administered 2016-08-21 – 2016-08-24 (×5): 3 mL via INTRAVENOUS

## 2016-08-20 MED ORDER — LISINOPRIL 20 MG PO TABS
20.0000 mg | ORAL_TABLET | Freq: Every day | ORAL | Status: DC
  Administered 2016-08-21 – 2016-08-23 (×4): 20 mg via ORAL
  Filled 2016-08-20 (×4): qty 1

## 2016-08-20 MED ORDER — ASPIRIN EC 81 MG PO TBEC
81.0000 mg | DELAYED_RELEASE_TABLET | Freq: Every day | ORAL | Status: DC
  Administered 2016-08-21 – 2016-08-24 (×4): 81 mg via ORAL
  Filled 2016-08-20 (×4): qty 1

## 2016-08-20 MED ORDER — HYDROCHLOROTHIAZIDE 12.5 MG PO CAPS
12.5000 mg | ORAL_CAPSULE | Freq: Every day | ORAL | Status: DC
  Administered 2016-08-21: 12.5 mg via ORAL
  Filled 2016-08-20: qty 1

## 2016-08-20 MED ORDER — POTASSIUM CHLORIDE CRYS ER 20 MEQ PO TBCR
20.0000 meq | EXTENDED_RELEASE_TABLET | Freq: Every day | ORAL | Status: DC
  Administered 2016-08-20 – 2016-08-24 (×5): 20 meq via ORAL
  Filled 2016-08-20 (×5): qty 1

## 2016-08-20 MED ORDER — NITROGLYCERIN IN D5W 200-5 MCG/ML-% IV SOLN: 0.0000 ug/min | INTRAVENOUS | Status: DC

## 2016-08-20 MED ORDER — ONDANSETRON HCL 4 MG PO TABS: 4.0000 mg | ORAL_TABLET | Freq: Four times a day (QID) | ORAL | Status: DC | PRN

## 2016-08-20 MED ORDER — FUROSEMIDE 10 MG/ML IJ SOLN
40.0000 mg | Freq: Two times a day (BID) | INTRAMUSCULAR | Status: DC
  Administered 2016-08-20: 40 mg via INTRAVENOUS
  Filled 2016-08-20: qty 4

## 2016-08-20 NOTE — ED Notes (Signed)
ED Provider at bedside. 

## 2016-08-20 NOTE — ED Notes (Signed)
Dr. Lynelle DoctorKnapp requests that nitro drip remain at 7420mcg/min

## 2016-08-20 NOTE — Progress Notes (Signed)
  Echocardiogram 2D Echocardiogram has been performed.  Debbie Davis, Debbie Davis 08/20/2016, 7:05 PM

## 2016-08-20 NOTE — ED Notes (Addendum)
Pt attempting to use bedside commode.

## 2016-08-20 NOTE — H&P (Signed)
History and Physical    Debbie Davis ZOX:[id number] DOB: [birth year] DOA: 08/20/2016  PCP: Lupe Carney, MD  Patient coming from: Home.  Chief Complaint: Shortness of breath.  HPI: Debbie Davis is a 55 y.o. female with history of hypertension, tobacco abuse presents to the ER because of worsening shortness of breath. Patient states she has been getting progressively short of breath over the last 2 months. Shortness of breath increases on exertion and on lying flat. Recently she has been finding it difficult to sleep flat. Patient also over the last 1 week has noticed increased swelling of the lower extremities. In the ER patient was found to have markedly elevated blood pressure and tachycardia. Patient also states she gets chest pain off and on which last a few minutes which is pressure-like. ER physician had got an emergent 2-D echo done which results are pending. CT angiogram of the chest was done which does not show any PE or pericardial effusion. Patient is being admitted for acute respiratory failure and hypertensive emergency.  ED Course:  Patient was started on nitroglycerin infusion. CT angiogram of the chest and echocardiogram was done.  Review of Systems: As per HPI, rest all negative.   Past Medical History:  Diagnosis Date  . Allergy   . Hypertension     Past Surgical History:  Procedure Laterality Date  . KIDNEY SURGERY Right    pt states she had blockage  . TUBAL LIGATION       reports that she has been smoking Cigarettes.  She has a 15.00 pack-year smoking history. She has never used smokeless tobacco. She reports that she drinks alcohol. She reports that she does not use drugs.  Allergies  Allergen Reactions  . Codeine Rash  . Morphine And Related Rash    Family History  Problem Relation Age of Onset  . Cancer Mother   . Heart disease Father   . Diabetes Sister   . Diabetes Brother   . Diabetes Sister   . Diabetes Sister   . Diabetes Sister      Prior to Admission medications   Medication Sig Start Date End Date Taking? Authorizing Provider  albuterol (PROVENTIL HFA;VENTOLIN HFA) 108 (90 Base) MCG/ACT inhaler Inhale 1-2 puffs into the lungs every 4 (four) hours as needed for wheezing or shortness of breath. 08/12/16  Yes Cheri Fowler, PA-C  hydrocortisone (ANUSOL-HC) 25 MG suppository Place 1 suppository (25 mg total) rectally 2 (two) times daily. 08/12/16  Yes Cheri Fowler, PA-C  lisinopril-hydrochlorothiazide (PRINZIDE,ZESTORETIC) 20-12.5 MG tablet Take 1 tablet by mouth daily.   Yes Historical Provider, MD  Multiple Vitamin (MULTIVITAMIN WITH MINERALS) TABS tablet Take 1 tablet by mouth daily.   Yes Historical Provider, MD  naproxen sodium (ANAPROX) 220 MG tablet Take 220 mg by mouth every 12 (twelve) hours as needed (pain).   Yes Historical Provider, MD  lisinopril-hydrochlorothiazide (PRINZIDE,ZESTORETIC) 20-12.5 MG tablet Take 1 tablet by mouth daily. Patient taking differently: Take 1 tablet by mouth every morning.  07/16/16 08/15/16  Nira Conn, MD  predniSONE (DELTASONE) 20 MG tablet Take 2 tablets (40 mg total) by mouth daily. Patient not taking: Reported on 08/20/2016 08/12/16   Cheri Fowler, PA-C    Physical Exam: Vitals:   08/20/16 1815 08/20/16 1830 08/20/16 1845 08/20/16 2123  BP: 166/90 151/96 (!) 158/108 (!) 185/115  Pulse: (!) 125 (!) 126 (!) 126   Resp: (!) 34 18 19   Temp:    98.4 F (36.9 C)  TempSrc:    Oral  SpO2: 99% 99% 96%   Weight:      Height:          Constitutional: Moderately built and nourished. Vitals:   08/20/16 1815 08/20/16 1830 08/20/16 1845 08/20/16 2123  BP: 166/90 151/96 (!) 158/108 (!) 185/115  Pulse: (!) 125 (!) 126 (!) 126   Resp: (!) 34 18 19   Temp:    98.4 F (36.9 C)  TempSrc:    Oral  SpO2: 99% 99% 96%   Weight:      Height:       Eyes: Anicteric no pallor. ENMT:  No discharge from the ears eyes nose or mouth. Neck:  No JVD appreciated no mass  felt. Respiratory:  No rhonchi or crepitations. Cardiovascular:  S1-S2 heard no murmurs appreciated. Abdomen:  Soft nontender bowel sounds present. No guarding or rigidity. Musculoskeletal:  Bilateral lower extremity edema present. Skin:  No rash. Skin appears warm. Neurologic: alert awake oriented to time place and person. Moves all extremities. Psychiatric:  Appears normal. Normal affect.   Labs on Admission: I have personally reviewed following labs and imaging studies  CBC:  Recent Labs Lab 08/20/16 1655  WBC 9.8  HGB 12.1  HCT 38.8  MCV 87.4  PLT 212   Basic Metabolic Panel:  Recent Labs Lab 08/20/16 1655  NA 138  K 3.1*  CL 103  CO2 26  GLUCOSE 128*  BUN 16  CREATININE 0.89  CALCIUM 8.6*   GFR: Estimated Creatinine Clearance: 71.7 mL/min (by C-G formula based on SCr of 0.89 mg/dL). Liver Function Tests: No results for input(s): AST, ALT, ALKPHOS, BILITOT, PROT, ALBUMIN in the last 168 hours. No results for input(s): LIPASE, AMYLASE in the last 168 hours. No results for input(s): AMMONIA in the last 168 hours. Coagulation Profile: No results for input(s): INR, PROTIME in the last 168 hours. Cardiac Enzymes: No results for input(s): CKTOTAL, CKMB, CKMBINDEX, TROPONINI in the last 168 hours. BNP (last 3 results) No results for input(s): PROBNP in the last 8760 hours. HbA1C: No results for input(s): HGBA1C in the last 72 hours. CBG: No results for input(s): GLUCAP in the last 168 hours. Lipid Profile: No results for input(s): CHOL, HDL, LDLCALC, TRIG, CHOLHDL, LDLDIRECT in the last 72 hours. Thyroid Function Tests:  Recent Labs  08/20/16 1801  TSH 1.288  FREET4 1.01   Anemia Panel: No results for input(s): VITAMINB12, FOLATE, FERRITIN, TIBC, IRON, RETICCTPCT in the last 72 hours. Urine analysis:    Component Value Date/Time   COLORURINE AMBER (A) 12/04/2015 1104   APPEARANCEUR CLOUDY (A) 12/04/2015 1104   LABSPEC 1.030 12/04/2015 1104   PHURINE  5.5 12/04/2015 1104   GLUCOSEU NEGATIVE 12/04/2015 1104   HGBUR TRACE (A) 12/04/2015 1104   BILIRUBINUR SMALL (A) 12/04/2015 1104   BILIRUBINUR neg 05/03/2014 1011   KETONESUR NEGATIVE 12/04/2015 1104   PROTEINUR 100 (A) 12/04/2015 1104   UROBILINOGEN 0.2 05/03/2014 1011   NITRITE NEGATIVE 12/04/2015 1104   LEUKOCYTESUR SMALL (A) 12/04/2015 1104   Sepsis Labs: @LABRCNTIP (procalcitonin:4,lacticidven:4) )No results found for this or any previous visit (from the past 240 hour(s)).   Radiological Exams on Admission: Ct Angio Chest Pe W And/or Wo Contrast  Result Date: 08/20/2016 CLINICAL DATA:  Persistent tachycardia and dyspnea EXAM: CT ANGIOGRAPHY CHEST WITH CONTRAST TECHNIQUE: Multidetector CT imaging of the chest was performed using the standard protocol during bolus administration of intravenous contrast. Multiplanar CT image reconstructions and MIPs were obtained to evaluate the vascular  anatomy. CONTRAST:  60 mL Isovue 350 intravenous COMPARISON:  Chest x-ray 08/20/2016 FINDINGS: Cardiovascular: Suboptimal contrast opacification of the distal pulmonary arteries. There is no main or central segmental filling defect identified to suggest pulmonary embolus. There is no evidence for aortic dissection or mediastinal hematoma. There is atherosclerosis at the aortic arch. Heart size upper limits of normal. No pericardial effusion. Mediastinum/Nodes: Thyroid gland grossly unremarkable. Few prominent lymph nodes within the mediastinum measuring up to 1 cm in short axis. No axillary adenopathy. Mild air distention of the esophagus. Trachea and mainstem bronchi appear within normal limits. Lungs/Pleura: Mild emphysematous changes within the bilateral upper lobes. No consolidation or effusion. No pneumothorax. Upper Abdomen: Limited images of the upper abdomen demonstrate mild nodular enlargement of left adrenal gland without discrete mass. No acute abnormalities. Musculoskeletal: No acute osseous  abnormality. Review of the MIP images confirms the above findings. IMPRESSION: 1. Suboptimal contrast opacification of lower order pulmonary arterial branches, no evidence for central or main segmental pulmonary embolus. No evidence for aortic dissection. 2. Mild emphysematous disease of the bilateral upper lobes. 3. Mild mediastinal adenopathy. Electronically Signed   By: Jasmine PangKim  Fujinaga M.D.   On: 08/20/2016 20:10   Dg Chest Port 1 View  Result Date: 08/20/2016 CLINICAL DATA:  Acute on chronic shortness of breath EXAM: PORTABLE CHEST 1 VIEW COMPARISON:  08/12/2016 FINDINGS: EKG leads create artifact over the chest. Prominence of the left ventricle without overall cardiomegaly. Aortic atherosclerosis. There is no edema, consolidation, effusion, or pneumothorax. IMPRESSION: No active disease. Electronically Signed   By: Marnee SpringJonathon  Watts M.D.   On: 08/20/2016 16:56    EKG: Independently reviewed.  Sinus tachycardia.  Assessment/Plan Principal Problem:   Acute respiratory failure with hypoxia (HCC) Active Problems:   Hypertensive urgency   Sinus tachycardia   1. Acute respiratory failure with hypoxia - suspect most likely from diastolic CHF given the lower extremity edema and markedly elevated blood pressure. 2-D echo results are pending. We'll cycle cardiac markers. Patient has been started on nitroglycerin infusion at the ER which will be continued. I have also ordered Lasix 40 mg IV every 12. Continue with patient's lisinopril and hydrochlorothiazide. 2. Hypertensive urgency - patient is on nitroglycerin infusion. Patient is also on home dose of lisinopril and hydrochlorothiazide 20/12.5. May have to increase the dose of lisinopril hydrochlorothiazide if blood pressure does not improve with Lasix. If patient becomes more compensated may to have add Coreg. 3. Sinus tachycardia - probably from decompensated CHF. Check TSH. 4. Lower extremity edema - check Dopplers. 5. COPD - CT scan shows  emphysematous changes. Patient is presently not wheezing. Will keep patient on when necessary Xopenex. 6. Tobacco abuse - patient states she quit last week. 7. Mediastinal lymphadenopathy - CT scan shows mediastinal lymphadenopathy. May discuss with pulmonologist in a.m. for possible sarcoidosis.  UA is pending.   DVT prophylaxis:  Lovenox. Code Status:  Full code.  Family Communication:  Discussed with patient.  Disposition Plan:  Home.  Consults called:  None.  Admission status:  Inpatient.    Eduard ClosKAKRAKANDY,Ila Landowski N. MD [facility] Pager [phone].  If 7PM-7AM, please contact night-coverage [email or web address] Password [email or web address]  08/20/2016, 10:12 PM

## 2016-08-20 NOTE — ED Notes (Signed)
Care handoff to Katie, RN

## 2016-08-20 NOTE — ED Provider Notes (Signed)
[facility]-EMERGENCY DEPT Provider Note   CSN: [id number] Arrival date & time: 08/20/16  1541     History   Chief Complaint Chief Complaint  Patient presents with  . Shortness of Breath    HPI Debbie Davis is a 55 y.o. female.  HPI Patient presents to the emergency room for difficulty with shortness of breath. Her symptoms started about 2 months ago. She has a history of hypertension but  had not seen her primary doctor for a while.  She was seen in the emergency room in October this year and released. She presented to the emergency room on November 13 with similar symptoms. At that time she was complaining of shortness of breath cough or sputum production lower extremity swelling.  She had an evaluation in the emergency room. She had a normal chest x-ray. Her EKG showed a sinus tachycardia. Laboratory tests including d-dimer and BMP were unremarkable. Patient continues to feel short of breath. Symptoms worsen with exertion. She continues to have lower extremity swelling. She went to the [facility] today and was noted to be hypertensive and dyspneic. They sent her to the emergency room for further evaluation. Past Medical History:  Diagnosis Date  . Allergy   . Hypertension     There are no active problems to display for this patient.   Past Surgical History:  Procedure Laterality Date  . KIDNEY SURGERY Right    pt states she had blockage  . TUBAL LIGATION      OB History    No data available       Home Medications    Prior to Admission medications   Medication Sig Start Date End Date Taking? Authorizing Provider  albuterol (PROVENTIL HFA;VENTOLIN HFA) 108 (90 Base) MCG/ACT inhaler Inhale 1-2 puffs into the lungs every 4 (four) hours as needed for wheezing or shortness of breath. 08/12/16  Yes Cheri FowlerKayla Rose, PA-C  hydrocortisone (ANUSOL-HC) 25 MG suppository Place 1 suppository (25 mg total) rectally 2 (two) times daily. 08/12/16  Yes Cheri FowlerKayla Rose, PA-C    lisinopril-hydrochlorothiazide (PRINZIDE,ZESTORETIC) 20-12.5 MG tablet Take 1 tablet by mouth daily.   Yes Historical Provider, MD  Multiple Vitamin (MULTIVITAMIN WITH MINERALS) TABS tablet Take 1 tablet by mouth daily.   Yes Historical Provider, MD  naproxen sodium (ANAPROX) 220 MG tablet Take 220 mg by mouth every 12 (twelve) hours as needed (pain).   Yes Historical Provider, MD  lisinopril-hydrochlorothiazide (PRINZIDE,ZESTORETIC) 20-12.5 MG tablet Take 1 tablet by mouth daily. Patient taking differently: Take 1 tablet by mouth every morning.  07/16/16 08/15/16  Nira ConnPedro Eduardo Cardama, MD  predniSONE (DELTASONE) 20 MG tablet Take 2 tablets (40 mg total) by mouth daily. Patient not taking: Reported on 08/20/2016 08/12/16   Cheri FowlerKayla Rose, PA-C    Family History Family History  Problem Relation Age of Onset  . Cancer Mother   . Heart disease Father   . Diabetes Sister   . Diabetes Brother   . Diabetes Sister   . Diabetes Sister   . Diabetes Sister     Social History Social History  Substance Use Topics  . Smoking status: Current Every Day Smoker    Packs/day: 0.50    Years: 30.00    Types: Cigarettes  . Smokeless tobacco: Never Used  . Alcohol use Yes     Comment: special occasions      Allergies   Codeine and Morphine and related   Review of Systems Review of Systems  All other systems reviewed and  are negative.    Physical Exam Updated Vital Signs BP (!) 185/104   Pulse (!) 132   Temp 98.5 F (36.9 C) (Oral)   Resp 22   Ht 5' (1.524 m)   Wt 90.7 kg   SpO2 98%   BMI 39.06 kg/m   Physical Exam  Constitutional: She appears well-developed and well-nourished. No distress.  HENT:  Head: Normocephalic and atraumatic.  Right Ear: External ear normal.  Left Ear: External ear normal.  Eyes: Conjunctivae are normal. Right eye exhibits no discharge. Left eye exhibits no discharge. No scleral icterus.  Neck: Neck supple. No tracheal deviation present. No thyromegaly  present.  Cardiovascular: Regular rhythm, normal heart sounds and intact distal pulses.  Tachycardia present.   No murmur heard. Pulmonary/Chest: Effort normal. No stridor. No respiratory distress. She has no wheezes. She has rales (bilateral lower lungs).  Abdominal: Soft. Bowel sounds are normal. She exhibits no distension. There is no tenderness. There is no rebound and no guarding.  Musculoskeletal: She exhibits edema. She exhibits no tenderness.  Neurological: She is alert. She has normal strength. No cranial nerve deficit (no facial droop, extraocular movements intact, no slurred speech) or sensory deficit. She exhibits normal muscle tone. She displays no seizure activity. Coordination normal.  Skin: Skin is warm and dry. No rash noted.  Psychiatric: She has a normal mood and affect.  Nursing note and vitals reviewed.    ED Treatments / Results  Labs (all labs ordered are listed, but only abnormal results are displayed) Labs Reviewed  BASIC METABOLIC PANEL - Abnormal; Notable for the following:       Result Value   Potassium 3.1 (*)    Glucose, Bld 128 (*)    Calcium 8.6 (*)    All other components within normal limits  CBC  BRAIN NATRIURETIC PEPTIDE  D-DIMER, QUANTITATIVE (NOT AT [facility])  TSH  T4, FREE  RAPID URINE DRUG SCREEN, HOSP PERFORMED  I-STAT TROPOININ, ED    EKG  EKG Interpretation  Date/Time:  Tuesday August 20 2016 15:56:16 EST Ventricular Rate:  123 PR Interval:  118 QRS Duration: 84 QT Interval:  324 QTC Calculation: 463 R Axis:   19 Text Interpretation:  Sinus tachycardia Left ventricular hypertrophy with repolarization abnormality Abnormal ECG repolarization changes increased since prior tracing Confirmed by Charrie Mcconnon  MD-J, Daryan Buell ([phone]) on 08/20/2016 4:02:38 PM Also confirmed by Lugene Beougher  MD-J, Bary Limbach [phone]), editor [employer], Cala BradfordKIMBERLY [phone])  on 08/20/2016 4:32:29 PM       Radiology Dg Chest Port 1 View  Result Date: 08/20/2016 CLINICAL DATA:  Acute on  chronic shortness of breath EXAM: PORTABLE CHEST 1 VIEW COMPARISON:  08/12/2016 FINDINGS: EKG leads create artifact over the chest. Prominence of the left ventricle without overall cardiomegaly. Aortic atherosclerosis. There is no edema, consolidation, effusion, or pneumothorax. IMPRESSION: No active disease. Electronically Signed   By: Marnee SpringJonathon  Watts M.D.   On: 08/20/2016 16:56    Procedures .Critical Care Performed by: Linwood DibblesKNAPP, Jackson Fetters Authorized by: Linwood DibblesKNAPP, Josaphine Shimamoto   Critical care provider statement:    Critical care time (minutes):  45   Critical care was necessary to treat or prevent imminent or life-threatening deterioration of the following conditions: hypertensive emergency.   Critical care was time spent personally by me on the following activities:  Discussions with consultants, evaluation of patient's response to treatment, examination of patient, ordering and performing treatments and interventions, ordering and review of laboratory studies, ordering and review of radiographic studies, pulse oximetry, re-evaluation of patient's  condition, obtaining history from patient or surrogate and review of old charts   (including critical care time)  Medications Ordered in ED Medications  iopamidol (ISOVUE-370) 76 % injection (not administered)  nitroGLYCERIN 50 mg in dextrose 5 % 250 mL (0.2 mg/mL) infusion (20 mcg/min Intravenous Rate/Dose Change 08/20/16 1726)     Initial Impression / Assessment and Plan / ED Course  I have reviewed the triage vital signs and the nursing notes.  Pertinent labs & imaging results that were available during my care of the patient were reviewed by me and considered in my medical decision making (see chart for details).  Clinical Course as of Aug 21 1839  Tue Aug 20, 2016  1727 Pt remains tachycardic and hypertensive.  Slight improvement in BP with NTG drip.  I reviewed her previous visits.  D dimer negative and BNP normal on prior visits.  Pt may benefit from an  echocardiogram.  Will consult with cardiology to discuss further.   [JK]  1817 Echocardiogram ordered.  Labs are reassuring however etiology of her tachycardia and hypertension is unclear.  Thyroid is pending.  Will add on a CT of the chest and plan on admission for further evaluation  [JK]    Clinical Course User Index [JK] Linwood Dibbles, MD    Pt has persistent hypertension and tachycardia.   Etiology unclear at this time.  UDS and thyroid panels pending.  CT angio and echocardiogram pending.  BP has improved with NTG.  Will consult with medical service for admission and further evaluation.  Final Clinical Impressions(s) / ED Diagnoses   Final diagnoses:  SOB (shortness of breath)  Sinus tachycardia  Hypertensive urgency      Linwood Dibbles, MD 08/20/16 [phone]

## 2016-08-21 ENCOUNTER — Inpatient Hospital Stay (HOSPITAL_COMMUNITY): Payer: Managed Care, Other (non HMO)

## 2016-08-21 DIAGNOSIS — M79609 Pain in unspecified limb: Secondary | ICD-10-CM

## 2016-08-21 DIAGNOSIS — R0602 Shortness of breath: Secondary | ICD-10-CM

## 2016-08-21 LAB — BASIC METABOLIC PANEL
ANION GAP: 9 (ref 5–15)
BUN: 12 mg/dL (ref 6–20)
CHLORIDE: 103 mmol/L (ref 101–111)
CO2: 27 mmol/L (ref 22–32)
Calcium: 8.9 mg/dL (ref 8.9–10.3)
Creatinine, Ser: 0.98 mg/dL (ref 0.44–1.00)
GFR calc Af Amer: >60 mL/min (ref >60)
Glucose, Bld: 175 mg/dL — ABNORMAL HIGH (ref 65–99)
POTASSIUM: 3.8 mmol/L (ref 3.5–5.1)
SODIUM: 139 mmol/L (ref 135–145)

## 2016-08-21 LAB — CBC
HEMATOCRIT: 39 % (ref 36.0–46.0)
HEMOGLOBIN: 12.1 g/dL (ref 12.0–15.0)
MCH: 27.1 pg (ref 26.0–34.0)
MCHC: 31 g/dL (ref 30.0–36.0)
MCV: 87.2 fL (ref 78.0–100.0)
Platelets: 206 10*3/uL (ref 150–400)
RBC: 4.47 MIL/uL (ref 3.87–5.11)
RDW: 15.6 % — ABNORMAL HIGH (ref 11.5–15.5)
WBC: 7.7 10*3/uL (ref 4.0–10.5)

## 2016-08-21 LAB — ECHOCARDIOGRAM COMPLETE
Height: 60 in
WEIGHTICAEL: 3200 [oz_av]

## 2016-08-21 LAB — MRSA PCR SCREENING: MRSA BY PCR: NEGATIVE

## 2016-08-21 LAB — TROPONIN I: Troponin I: <0.03 ng/mL (ref <0.03)

## 2016-08-21 MED ORDER — CARVEDILOL 3.125 MG PO TABS: 3.1250 mg | ORAL_TABLET | Freq: Two times a day (BID) | ORAL | Status: DC

## 2016-08-21 MED ORDER — CARVEDILOL 6.25 MG PO TABS
6.2500 mg | ORAL_TABLET | Freq: Two times a day (BID) | ORAL | Status: DC
  Administered 2016-08-21 – 2016-08-22 (×3): 6.25 mg via ORAL
  Filled 2016-08-21 (×3): qty 1

## 2016-08-21 MED ORDER — LEVALBUTEROL HCL 0.63 MG/3ML IN NEBU: 0.6300 mg | INHALATION_SOLUTION | Freq: Four times a day (QID) | RESPIRATORY_TRACT | Status: DC | PRN

## 2016-08-21 MED ORDER — METOPROLOL TARTRATE 5 MG/5ML IV SOLN
5.0000 mg | Freq: Four times a day (QID) | INTRAVENOUS | Status: DC | PRN
  Administered 2016-08-21: 5 mg via INTRAVENOUS
  Filled 2016-08-21: qty 5

## 2016-08-21 MED ORDER — FUROSEMIDE 10 MG/ML IJ SOLN
40.0000 mg | Freq: Once | INTRAMUSCULAR | Status: AC
  Administered 2016-08-21: 40 mg via INTRAVENOUS
  Filled 2016-08-21: qty 4

## 2016-08-21 MED ORDER — CARVEDILOL 6.25 MG PO TABS: 6.2500 mg | ORAL_TABLET | Freq: Two times a day (BID) | ORAL | Status: DC

## 2016-08-21 MED ORDER — POTASSIUM CHLORIDE CRYS ER 20 MEQ PO TBCR
40.0000 meq | EXTENDED_RELEASE_TABLET | Freq: Two times a day (BID) | ORAL | Status: AC
Start: 2016-08-21 — End: 2016-08-21
  Administered 2016-08-21 (×2): 40 meq via ORAL
  Filled 2016-08-21 (×3): qty 2

## 2016-08-21 NOTE — Progress Notes (Signed)
Pt blood pressure 126/73, heart rate 87. Nitro decreased until off per MD order. Pt has no complaints at this time. Will continue to monitor pt. George Hughamella Letonya Mangels RN

## 2016-08-21 NOTE — Progress Notes (Signed)
*  PRELIMINARY RESULTS* Vascular Ultrasound Lower Extremity Venous Duplex has been completed.  Preliminary findings: No evidence of deep vein thrombosis or baker's cysts bilaterally.   Debbie FischerCharlotte C Feleshia Davis 08/21/2016, 9:59 AM

## 2016-08-21 NOTE — Progress Notes (Signed)
[facility] PROGRESS NOTE    Progress Note  Debbie AsaBetty A Alegria  UJW:[id number] DOB: 02/27/1961 DOA: 08/20/2016 PCP: Lupe Carneyean Mitchell, MD     Brief Narrative:   Debbie Davis is an 55 y.o. female past medical history of hypertension tobacco presents to the ED with worsening shortness of breath which has progressively gotten worse over the last 2 months with increased exertion and PND.  Assessment/Plan:   Acute respiratory failure with hypoxia due to  Hypertensive urgency - Question if this is due to noncompliance with her medication, her EKG show significant LVH, awaiting 2-D echo. He appears less than 70. - Her cardiac biomarkers are negative 2, her chest x-ray shows a mild pleural effusion. - CT imaging of the chest does not show a PE. - She is on currently on nitro drip we'll start her on Coreg lisinopril and decrease her Lasix, will provide TED hose that she has lower extremity edema, I cannot appreciate any JVD. - Give her oral potassium titrate the nitroglycerin off the goal blood pressure be 160/90-120/80. - She has lower extremity edema. Sinus tachycardia Reactive, EKG shows no ischemic changes. 2-D echo is pending.  COPD: CT scan of the chest show emphysematous changes she is currently not wheezing.  Mediastinal  lymphadenopathy: Seen on CT. ? Reactive, will need follow up with Pulmonary as an outpatient.  Tobacco abuse: Counseling was done.  Morbid obesity: Counseling.  DVT prophylaxis: lovenox Family Communication:none Disposition Plan/Barrier to D/C: unable to determine Code Status:     Code Status Orders        Start     Ordered   08/20/16 2211  Full code  Continuous     08/20/16 2211    Code Status History    Date Active Date Inactive Code Status Order ID Comments User Context   This patient has a current code status but no historical code status.        IV Access:    Peripheral IV   Procedures and diagnostic studies:   Ct Angio Chest  Pe W And/or Wo Contrast  Result Date: 08/20/2016 CLINICAL DATA:  Persistent tachycardia and dyspnea EXAM: CT ANGIOGRAPHY CHEST WITH CONTRAST TECHNIQUE: Multidetector CT imaging of the chest was performed using the standard protocol during bolus administration of intravenous contrast. Multiplanar CT image reconstructions and MIPs were obtained to evaluate the vascular anatomy. CONTRAST:  60 mL Isovue 350 intravenous COMPARISON:  Chest x-ray 08/20/2016 FINDINGS: Cardiovascular: Suboptimal contrast opacification of the distal pulmonary arteries. There is no main or central segmental filling defect identified to suggest pulmonary embolus. There is no evidence for aortic dissection or mediastinal hematoma. There is atherosclerosis at the aortic arch. Heart size upper limits of normal. No pericardial effusion. Mediastinum/Nodes: Thyroid gland grossly unremarkable. Few prominent lymph nodes within the mediastinum measuring up to 1 cm in short axis. No axillary adenopathy. Mild air distention of the esophagus. Trachea and mainstem bronchi appear within normal limits. Lungs/Pleura: Mild emphysematous changes within the bilateral upper lobes. No consolidation or effusion. No pneumothorax. Upper Abdomen: Limited images of the upper abdomen demonstrate mild nodular enlargement of left adrenal gland without discrete mass. No acute abnormalities. Musculoskeletal: No acute osseous abnormality. Review of the MIP images confirms the above findings. IMPRESSION: 1. Suboptimal contrast opacification of lower order pulmonary arterial branches, no evidence for central or main segmental pulmonary embolus. No evidence for aortic dissection. 2. Mild emphysematous disease of the bilateral upper lobes. 3. Mild mediastinal adenopathy. Electronically Signed   By: Selena BattenKim  Jake Samples M.D.   On: 08/20/2016 20:10   Dg Chest Port 1 View  Result Date: 08/20/2016 CLINICAL DATA:  Acute on chronic shortness of breath EXAM: PORTABLE CHEST 1 VIEW  COMPARISON:  08/12/2016 FINDINGS: EKG leads create artifact over the chest. Prominence of the left ventricle without overall cardiomegaly. Aortic atherosclerosis. There is no edema, consolidation, effusion, or pneumothorax. IMPRESSION: No active disease. Electronically Signed   By: Marnee Spring M.D.   On: 08/20/2016 16:56     Medical Consultants:    None.  Anti-Infectives:   None  Subjective:    Debbie Asa she relates she still cannot lay flat, she is complaining of headaches.  Objective:    Vitals:   08/20/16 2347 08/21/16 0000 08/21/16 0400 08/21/16 0500  BP: (!) 160/109 (!) 161/106 (!) 153/93   Pulse: (!) 124 (!) 114 (!) 122   Resp: (!) 21 19    Temp: 98.4 F (36.9 C)  98 F (36.7 C)   TempSrc: Oral  Oral   SpO2: 100% 98% 100%   Weight:    89.4 kg (197 lb 1.5 oz)  Height:        Intake/Output Summary (Last 24 hours) at 08/21/16 0751 Last data filed at 08/21/16 0600  Gross per 24 hour  Intake           419.36 ml  Output              875 ml  Net          -455.64 ml   Filed Weights   08/20/16 1622 08/20/16 2128 08/21/16 0500  Weight: 90.7 kg (200 lb) 90 kg (198 lb 8 oz) 89.4 kg (197 lb 1.5 oz)    Exam: General exam: In no acute distress. Respiratory system: Good air movement and clear to auscultation. Cardiovascular system: S1 & S2 heard, RRR.- JVD. Gastrointestinal system: Abdomen is nondistended, soft and nontender.  Extremities: No pedal edema. Skin: No rashes, lesions or ulcers Psychiatry: Judgement and insight appear normal. Mood & affect appropriate.    Data Reviewed:    Labs: Basic Metabolic Panel:  Recent Labs Lab 08/20/16 1655 08/20/16 2239 08/21/16 0621  NA 138  --  139  K 3.1*  --  3.8  CL 103  --  103  CO2 26  --  27  GLUCOSE 128*  --  175*  BUN 16  --  12  CREATININE 0.89 1.02* 0.98  CALCIUM 8.6*  --  8.9  MG  --  2.0  --    GFR Estimated Creatinine Clearance: 64.6 mL/min (by C-G formula based on SCr of 0.98  mg/dL). Liver Function Tests: No results for input(s): AST, ALT, ALKPHOS, BILITOT, PROT, ALBUMIN in the last 168 hours. No results for input(s): LIPASE, AMYLASE in the last 168 hours. No results for input(s): AMMONIA in the last 168 hours. Coagulation profile No results for input(s): INR, PROTIME in the last 168 hours.  CBC:  Recent Labs Lab 08/20/16 1655 08/20/16 2239 08/21/16 0621  WBC 9.8 10.2 7.7  HGB 12.1 12.4 12.1  HCT 38.8 39.3 39.0  MCV 87.4 86.4 87.2  PLT 212 207 206   Cardiac Enzymes:  Recent Labs Lab 08/20/16 2239 08/21/16 0621  TROPONINI <0.03 <0.03   BNP (last 3 results) No results for input(s): PROBNP in the last 8760 hours. CBG: No results for input(s): GLUCAP in the last 168 hours. D-Dimer:  Recent Labs  08/20/16 1655  DDIMER <0.27   Hgb A1c: No results  for input(s): HGBA1C in the last 72 hours. Lipid Profile: No results for input(s): CHOL, HDL, LDLCALC, TRIG, CHOLHDL, LDLDIRECT in the last 72 hours. Thyroid function studies:  Recent Labs  08/20/16 2239  TSH 0.837   Anemia work up: No results for input(s): VITAMINB12, FOLATE, FERRITIN, TIBC, IRON, RETICCTPCT in the last 72 hours. Sepsis Labs:  Recent Labs Lab 08/20/16 1655 08/20/16 2239 08/21/16 0621  WBC 9.8 10.2 7.7   Microbiology Recent Results (from the past 240 hour(s))  MRSA PCR Screening     Status: None   Collection Time: 08/20/16  9:29 PM  Result Value Ref Range Status   MRSA by PCR NEGATIVE NEGATIVE Final    Comment:        The GeneXpert MRSA Assay (FDA approved for NASAL specimens only), is one component of a comprehensive MRSA colonization surveillance program. It is not intended to diagnose MRSA infection nor to guide or monitor treatment for MRSA infections.      Medications:   . aspirin EC  81 mg Oral Daily  . enoxaparin (LOVENOX) injection  40 mg Subcutaneous QHS  . furosemide  40 mg Intravenous BID  . lisinopril  20 mg Oral Daily   And  .  hydrochlorothiazide  12.5 mg Oral Daily  . potassium chloride  20 mEq Oral Daily  . sodium chloride flush  3 mL Intravenous Q12H   Continuous Infusions: . nitroGLYCERIN 44 mcg/min (08/21/16 0323)    Time spent: 35 min   LOS: 1 day   Marinda ElkFELIZ ORTIZ, ABRAHAM  [facility] Pager [phone]  *Please refer to [email or web address], password [email or web address] to get updated schedule on who will round on this patient, as hospitalists switch teams weekly. If 7PM-7AM, please contact night-coverage at [email or web address], password [email or web address] for any overnight needs.  08/21/2016, 7:51 AM

## 2016-08-22 ENCOUNTER — Encounter (HOSPITAL_COMMUNITY): Payer: Self-pay | Admitting: Internal Medicine

## 2016-08-22 DIAGNOSIS — E1165 Type 2 diabetes mellitus with hyperglycemia: Secondary | ICD-10-CM

## 2016-08-22 DIAGNOSIS — E1310 Other specified diabetes mellitus with ketoacidosis without coma: Secondary | ICD-10-CM

## 2016-08-22 DIAGNOSIS — I5031 Acute diastolic (congestive) heart failure: Secondary | ICD-10-CM

## 2016-08-22 DIAGNOSIS — R590 Localized enlarged lymph nodes: Secondary | ICD-10-CM

## 2016-08-22 DIAGNOSIS — IMO0002 Reserved for concepts with insufficient information to code with codable children: Secondary | ICD-10-CM

## 2016-08-22 HISTORY — DX: Type 2 diabetes mellitus with hyperglycemia: E11.65

## 2016-08-22 HISTORY — DX: Reserved for concepts with insufficient information to code with codable children: IMO0002

## 2016-08-22 LAB — BASIC METABOLIC PANEL
Anion gap: 8 (ref 5–15)
BUN: 22 mg/dL — AB (ref 6–20)
CALCIUM: 8.9 mg/dL (ref 8.9–10.3)
CO2: 23 mmol/L (ref 22–32)
CREATININE: 1.02 mg/dL — AB (ref 0.44–1.00)
Chloride: 107 mmol/L (ref 101–111)
Glucose, Bld: 146 mg/dL — ABNORMAL HIGH (ref 65–99)
Potassium: 4.5 mmol/L (ref 3.5–5.1)
SODIUM: 138 mmol/L (ref 135–145)

## 2016-08-22 LAB — HEMOGLOBIN A1C
HEMOGLOBIN A1C: 7 % — AB (ref 4.8–5.6)
MEAN PLASMA GLUCOSE: 154 mg/dL

## 2016-08-22 LAB — TSH: TSH: 1.04 u[IU]/mL (ref 0.350–4.500)

## 2016-08-22 LAB — GLUCOSE, CAPILLARY
GLUCOSE-CAPILLARY: 136 mg/dL — AB (ref 65–99)
GLUCOSE-CAPILLARY: 181 mg/dL — AB (ref 65–99)
Glucose-Capillary: 193 mg/dL — ABNORMAL HIGH (ref 65–99)

## 2016-08-22 MED ORDER — FUROSEMIDE 10 MG/ML IJ SOLN
20.0000 mg | Freq: Two times a day (BID) | INTRAMUSCULAR | Status: DC
  Administered 2016-08-22 – 2016-08-23 (×4): 20 mg via INTRAVENOUS
  Filled 2016-08-22 (×4): qty 2

## 2016-08-22 MED ORDER — HYDROCHLOROTHIAZIDE 12.5 MG PO CAPS: 12.5000 mg | ORAL_CAPSULE | Freq: Every day | ORAL | Status: DC

## 2016-08-22 MED ORDER — INSULIN ASPART 100 UNIT/ML ~~LOC~~ SOLN
3.0000 [IU] | Freq: Three times a day (TID) | SUBCUTANEOUS | Status: DC
  Administered 2016-08-22 – 2016-08-24 (×6): 3 [IU] via SUBCUTANEOUS

## 2016-08-22 MED ORDER — INSULIN ASPART 100 UNIT/ML ~~LOC~~ SOLN: 0.0000 [IU] | Freq: Every day | SUBCUTANEOUS | Status: DC

## 2016-08-22 MED ORDER — CARVEDILOL 12.5 MG PO TABS
12.5000 mg | ORAL_TABLET | Freq: Two times a day (BID) | ORAL | Status: DC
  Administered 2016-08-22 – 2016-08-24 (×4): 12.5 mg via ORAL
  Filled 2016-08-22 (×4): qty 1

## 2016-08-22 MED ORDER — INSULIN ASPART 100 UNIT/ML ~~LOC~~ SOLN
0.0000 [IU] | Freq: Three times a day (TID) | SUBCUTANEOUS | Status: DC
  Administered 2016-08-22: 1 [IU] via SUBCUTANEOUS
  Administered 2016-08-22: 2 [IU] via SUBCUTANEOUS
  Administered 2016-08-23: 1 [IU] via SUBCUTANEOUS
  Administered 2016-08-23: 3 [IU] via SUBCUTANEOUS
  Administered 2016-08-23 – 2016-08-24 (×2): 1 [IU] via SUBCUTANEOUS

## 2016-08-22 NOTE — Progress Notes (Signed)
[facility] PROGRESS NOTE    Progress Note  Debbie Davis  [id number] DOB: 07/29/1961 DOA: 08/20/2016 PCP: Lupe Carneyean Mitchell, MD     Brief Narrative:   Debbie Davis is an 55 y.o. female past medical history of hypertension tobacco presents to the ED with worsening shortness of breath which has progressively gotten worse over the last 2 months with increased exertion and PND.  Assessment/Plan:   Acute respiratory failure with hypoxia due to Hypertensive urgency And acute decompensated diastolic heart failure: - Question if this is due to noncompliance with her medication, no off oxygen. - 2-D echo Preserved EF, with a grade 1 diastolic heart failure. - Her cardiac biomarkers are negative 2, her chest x-ray shows a mild pleural effusion. - Wean her off the nitro drip, increase Coreg, continue lisinopril and start IV Lasix. - Strict I's and O's daily weights. Fluid restricted diet. - Lower extremity Dopplers show no evidence of DVT  Sinus tachycardia Reactive, EKG shows no ischemic changes. Check TSH 2-D echo diastolic heart failure, continue to titrate Coreg up.  COPD: CT scan of the chest show emphysematous changes she is currently not wheezing. Single hilar adenopathy, which will need follow-up as an outpatient.  Uncontrolled diabetes mellitus type 2 without complications: A1c is 7.0, will start her on low-dose long-acting insulin plus sliding scale. On discharge will need to go home on oral hypoglycemic agents.  Mediastinal  lymphadenopathy: Seen on CT. ? Reactive, will need follow up with Pulmonary as an outpatient.  Tobacco abuse: Counseling was done.  Morbid obesity: Counseling.  DVT prophylaxis: lovenox Family Communication:none Disposition Plan/Barrier to D/C: unable to determine Code Status:     Code Status Orders        Start     Ordered   08/20/16 2211  Full code  Continuous     08/20/16 2211    Code Status History    Date Active Date  Inactive Code Status Order ID Comments User Context   This patient has a current code status but no historical code status.        IV Access:    Peripheral IV   Procedures and diagnostic studies:   Ct Angio Chest Pe W And/or Wo Contrast  Result Date: 08/20/2016 CLINICAL DATA:  Persistent tachycardia and dyspnea EXAM: CT ANGIOGRAPHY CHEST WITH CONTRAST TECHNIQUE: Multidetector CT imaging of the chest was performed using the standard protocol during bolus administration of intravenous contrast. Multiplanar CT image reconstructions and MIPs were obtained to evaluate the vascular anatomy. CONTRAST:  60 mL Isovue 350 intravenous COMPARISON:  Chest x-ray 08/20/2016 FINDINGS: Cardiovascular: Suboptimal contrast opacification of the distal pulmonary arteries. There is no main or central segmental filling defect identified to suggest pulmonary embolus. There is no evidence for aortic dissection or mediastinal hematoma. There is atherosclerosis at the aortic arch. Heart size upper limits of normal. No pericardial effusion. Mediastinum/Nodes: Thyroid gland grossly unremarkable. Few prominent lymph nodes within the mediastinum measuring up to 1 cm in short axis. No axillary adenopathy. Mild air distention of the esophagus. Trachea and mainstem bronchi appear within normal limits. Lungs/Pleura: Mild emphysematous changes within the bilateral upper lobes. No consolidation or effusion. No pneumothorax. Upper Abdomen: Limited images of the upper abdomen demonstrate mild nodular enlargement of left adrenal gland without discrete mass. No acute abnormalities. Musculoskeletal: No acute osseous abnormality. Review of the MIP images confirms the above findings. IMPRESSION: 1. Suboptimal contrast opacification of lower order pulmonary arterial branches, no evidence for central or main  segmental pulmonary embolus. No evidence for aortic dissection. 2. Mild emphysematous disease of the bilateral upper lobes. 3. Mild  mediastinal adenopathy. Electronically Signed   By: Jasmine Pang M.D.   On: 08/20/2016 20:10   Dg Chest Port 1 View  Result Date: 08/20/2016 CLINICAL DATA:  Acute on chronic shortness of breath EXAM: PORTABLE CHEST 1 VIEW COMPARISON:  08/12/2016 FINDINGS: EKG leads create artifact over the chest. Prominence of the left ventricle without overall cardiomegaly. Aortic atherosclerosis. There is no edema, consolidation, effusion, or pneumothorax. IMPRESSION: No active disease. Electronically Signed   By: Marnee Spring M.D.   On: 08/20/2016 16:56     Medical Consultants:    None.  Anti-Infectives:   None  Subjective:    Debbie Asa she relates that she is able to lay flat is not complaining of any chest pain and shortness of breath is improved.  Objective:    Vitals:   08/22/16 0456 08/22/16 0604 08/22/16 0633 08/22/16 0753  BP:  (!) 105/91 108/73 (!) 147/94  Pulse:  99 96 91  Resp:  12 12 13   Temp: 98.1 F (36.7 C)   98.2 F (36.8 C)  TempSrc: Oral   Oral  SpO2:  95% 96% 97%  Weight: 89.8 kg (198 lb)     Height:        Intake/Output Summary (Last 24 hours) at 08/22/16 0838 Last data filed at 08/22/16 0614  Gross per 24 hour  Intake          1326.67 ml  Output             1800 ml  Net          -473.33 ml   Filed Weights   08/20/16 2128 08/21/16 0500 08/22/16 0456  Weight: 90 kg (198 lb 8 oz) 89.4 kg (197 lb 1.5 oz) 89.8 kg (198 lb)    Exam: General exam: In no acute distress. Respiratory system: Good air movement and clear to auscultation. Cardiovascular system: S1 & S2 heard, RRR.- JVD. Gastrointestinal system: Abdomen is nondistended, soft and nontender.  Extremities: 2+ edema. Skin: No rashes, lesions or ulcers Psychiatry: Judgement and insight appear normal. Mood & affect appropriate.    Data Reviewed:    Labs: Basic Metabolic Panel:  Recent Labs Lab 08/20/16 1655 08/20/16 2239 08/21/16 0621 08/22/16 0322  NA 138  --  139 138  K 3.1*  --   3.8 4.5  CL 103  --  103 107  CO2 26  --  27 23  GLUCOSE 128*  --  175* 146*  BUN 16  --  12 22*  CREATININE 0.89 1.02* 0.98 1.02*  CALCIUM 8.6*  --  8.9 8.9  MG  --  2.0  --   --    GFR Estimated Creatinine Clearance: 62.2 mL/min (by C-G formula based on SCr of 1.02 mg/dL (H)). Liver Function Tests: No results for input(s): AST, ALT, ALKPHOS, BILITOT, PROT, ALBUMIN in the last 168 hours. No results for input(s): LIPASE, AMYLASE in the last 168 hours. No results for input(s): AMMONIA in the last 168 hours. Coagulation profile No results for input(s): INR, PROTIME in the last 168 hours.  CBC:  Recent Labs Lab 08/20/16 1655 08/20/16 2239 08/21/16 0621  WBC 9.8 10.2 7.7  HGB 12.1 12.4 12.1  HCT 38.8 39.3 39.0  MCV 87.4 86.4 87.2  PLT 212 207 206   Cardiac Enzymes:  Recent Labs Lab 08/20/16 2239 08/21/16 0621 08/21/16 1209  TROPONINI <0.03 <  0.03 <0.03   BNP (last 3 results) No results for input(s): PROBNP in the last 8760 hours. CBG: No results for input(s): GLUCAP in the last 168 hours. D-Dimer:  Recent Labs  08/20/16 1655  DDIMER <0.27   Hgb A1c:  Recent Labs  08/21/16 1209  HGBA1C 7.0*   Lipid Profile: No results for input(s): CHOL, HDL, LDLCALC, TRIG, CHOLHDL, LDLDIRECT in the last 72 hours. Thyroid function studies:  Recent Labs  08/20/16 2239  TSH 0.837   Anemia work up: No results for input(s): VITAMINB12, FOLATE, FERRITIN, TIBC, IRON, RETICCTPCT in the last 72 hours. Sepsis Labs:  Recent Labs Lab 08/20/16 1655 08/20/16 2239 08/21/16 0621  WBC 9.8 10.2 7.7   Microbiology Recent Results (from the past 240 hour(s))  MRSA PCR Screening     Status: None   Collection Time: 08/20/16  9:29 PM  Result Value Ref Range Status   MRSA by PCR NEGATIVE NEGATIVE Final    Comment:        The GeneXpert MRSA Assay (FDA approved for NASAL specimens only), is one component of a comprehensive MRSA colonization surveillance program. It is  not intended to diagnose MRSA infection nor to guide or monitor treatment for MRSA infections.      Medications:   . aspirin EC  81 mg Oral Daily  . carvedilol  12.5 mg Oral BID WC  . enoxaparin (LOVENOX) injection  40 mg Subcutaneous QHS  . lisinopril  20 mg Oral Daily  . potassium chloride  20 mEq Oral Daily  . sodium chloride flush  3 mL Intravenous Q12H   Continuous Infusions:   Time spent: 25 min   LOS: 2 days   Marinda ElkFELIZ ORTIZ, ABRAHAM  [facility] Pager [phone]  *Please refer to [email or web address], password [email or web address] to get updated schedule on who will round on this patient, as hospitalists switch teams weekly. If 7PM-7AM, please contact night-coverage at [email or web address], password [email or web address] for any overnight needs.  08/22/2016, 8:38 AM

## 2016-08-23 DIAGNOSIS — E118 Type 2 diabetes mellitus with unspecified complications: Secondary | ICD-10-CM

## 2016-08-23 DIAGNOSIS — E1165 Type 2 diabetes mellitus with hyperglycemia: Secondary | ICD-10-CM

## 2016-08-23 DIAGNOSIS — Z794 Long term (current) use of insulin: Secondary | ICD-10-CM

## 2016-08-23 LAB — GLUCOSE, CAPILLARY
GLUCOSE-CAPILLARY: 135 mg/dL — AB (ref 65–99)
GLUCOSE-CAPILLARY: 180 mg/dL — AB (ref 65–99)
Glucose-Capillary: 126 mg/dL — ABNORMAL HIGH (ref 65–99)
Glucose-Capillary: 220 mg/dL — ABNORMAL HIGH (ref 65–99)

## 2016-08-23 LAB — BASIC METABOLIC PANEL
ANION GAP: 9 (ref 5–15)
BUN: 22 mg/dL — ABNORMAL HIGH (ref 6–20)
CHLORIDE: 105 mmol/L (ref 101–111)
CO2: 25 mmol/L (ref 22–32)
CREATININE: 1.07 mg/dL — AB (ref 0.44–1.00)
Calcium: 9 mg/dL (ref 8.9–10.3)
GFR calc non Af Amer: 57 mL/min — ABNORMAL LOW (ref >60)
Glucose, Bld: 152 mg/dL — ABNORMAL HIGH (ref 65–99)
Potassium: 4.4 mmol/L (ref 3.5–5.1)
SODIUM: 139 mmol/L (ref 135–145)

## 2016-08-23 MED ORDER — LIVING WELL WITH DIABETES BOOK
Freq: Once | Status: AC
  Administered 2016-08-23: 11:00:00
  Filled 2016-08-23: qty 1

## 2016-08-23 MED ORDER — HYDROCHLOROTHIAZIDE 25 MG PO TABS
25.0000 mg | ORAL_TABLET | Freq: Every day | ORAL | Status: DC
  Administered 2016-08-23 – 2016-08-24 (×2): 25 mg via ORAL
  Filled 2016-08-23 (×2): qty 1

## 2016-08-23 NOTE — Progress Notes (Signed)
Spoke with patient regarding new onset diabetes.  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  Reviewed survival skills of diabetes with patient and oral agents.  Discussed normal blood sugar levels with patient.  She will follow-up with PCP.  Of note, patient was on steroids last week prior to admit which could have also increased blood sugar levels.  Living well with diabetes booklet at bedside.  No needs at this time.   Thanks, Beryl MeagerJenny Kaesha Kirsch, RN, BC-ADM Inpatient Diabetes Coordinator Pager [phone] (8a-5p)

## 2016-08-23 NOTE — Progress Notes (Signed)
PROGRESS NOTE    Debbie AsaBetty A Pollard  ZOX:[id number] DOB: 08/15/1961 DOA: 08/20/2016 PCP: Lupe Carneyean Mitchell, MD    Brief Narrative: Debbie Davis is an 55 y.o with hypertension tobacco presents with worsening shortness of breath which has progressively gotten worse over the last 2 months with increased exertion and PND. Unontrolled hypertension, responding well to diuresis and blood pressure control.    Assessment & Plan:   Principal Problem:   Acute respiratory failure with hypoxia (HCC) Active Problems:   Hypertensive urgency   Sinus tachycardia   Acute diastolic heart failure (HCC)   Diabetes type 2, uncontrolled (HCC)  1. Hypertensive urgency. Improve blood pressure control with coreg and lisinopril, will continue diuresis with furosemide, blood pressure systolic 140 to 150.   2. Hypoxic respiratory failure due to pulmonary edema. Will continue on diuresis with furosemide, continue oxymetry monitoring and supplemental 02 per [place]. Normal LV systolic function. Will hold on furosemide for now and change to hctz.   3. COPD. Stable with no signs of exacerbation, will continue bronchodilator therapy.   4. T2DM. Will continue insulin sliding scale for glucose cover and monitoring. Capillary glucose 126-220-135.   5. Mediastinal lymphadenopathy. Suspected reactive will nee follow up as outpatient.    DVT prophylaxis: enoxaparin  Code Status: full  Family Communication: No family at the beside Disposition Plan: home   Consultants:     Procedures:   Antimicrobials:    Subjective: Dyspnea and lower extremity edema has been improving, no chest pain, no nausea or vomiting.   Objective: Vitals:   08/23/16 0334 08/23/16 0753 08/23/16 1125 08/23/16 1557  BP: (!) 152/99 (!) 156/107 (!) 152/89 (!) 142/81  Pulse: (!) 105 (!) 102 96 91  Resp: 18 (!) 21 19 18   Temp: 98.3 F (36.8 C) 98.1 F (36.7 C) 98.8 F (37.1 C) 98 F (36.7 C)  TempSrc: Oral Oral Oral Oral  SpO2: 98% 100% 100%  100%  Weight: 89.2 kg (196 lb 9.6 oz)     Height:        Intake/Output Summary (Last 24 hours) at 08/23/16 1838 Last data filed at 08/23/16 1422  Gross per 24 hour  Intake              582 ml  Output              600 ml  Net              -18 ml   Filed Weights   08/21/16 0500 08/22/16 0456 08/23/16 0334  Weight: 89.4 kg (197 lb 1.5 oz) 89.8 kg (198 lb) 89.2 kg (196 lb 9.6 oz)    Examination:  General exam: not in pain or dyspnea E ENT: no pallor or icterus. Respiratory system: Clear to auscultation. Respiratory effort normal. Mild rales at bases, no wheezing or rhonchi.  Cardiovascular system: S1 & S2 heard, RRR. No JVD, murmurs, rubs, gallops or clicks. Trace pitting edema. Gastrointestinal system: Abdomen is nondistended, soft and nontender. No organomegaly or masses felt. Normal bowel sounds heard. Central nervous system: Alert and oriented. No focal neurological deficits. Extremities: Symmetric 5 x 5 power. Skin: No rashes, lesions or ulcers   Data Reviewed: I have personally reviewed following labs and imaging studies  CBC:  Recent Labs Lab 08/20/16 1655 08/20/16 2239 08/21/16 0621  WBC 9.8 10.2 7.7  HGB 12.1 12.4 12.1  HCT 38.8 39.3 39.0  MCV 87.4 86.4 87.2  PLT 212 207 206   Basic Metabolic Panel:  Recent  Labs Lab 08/20/16 1655 08/20/16 2239 08/21/16 0621 08/22/16 0322 08/23/16 0534  NA 138  --  139 138 139  K 3.1*  --  3.8 4.5 4.4  CL 103  --  103 107 105  CO2 26  --  27 23 25   GLUCOSE 128*  --  175* 146* 152*  BUN 16  --  12 22* 22*  CREATININE 0.89 1.02* 0.98 1.02* 1.07*  CALCIUM 8.6*  --  8.9 8.9 9.0  MG  --  2.0  --   --   --    GFR: Estimated Creatinine Clearance: 59.1 mL/min (by C-G formula based on SCr of 1.07 mg/dL (H)). Liver Function Tests: No results for input(s): AST, ALT, ALKPHOS, BILITOT, PROT, ALBUMIN in the last 168 hours. No results for input(s): LIPASE, AMYLASE in the last 168 hours. No results for input(s): AMMONIA in the  last 168 hours. Coagulation Profile: No results for input(s): INR, PROTIME in the last 168 hours. Cardiac Enzymes:  Recent Labs Lab 08/20/16 2239 08/21/16 0621 08/21/16 1209  TROPONINI <0.03 <0.03 <0.03   BNP (last 3 results) No results for input(s): PROBNP in the last 8760 hours. HbA1C:  Recent Labs  08/21/16 1209  HGBA1C 7.0*   CBG:  Recent Labs Lab 08/22/16 1657 08/22/16 2208 08/23/16 0757 08/23/16 1128 08/23/16 1623  GLUCAP 136* 181* 126* 220* 135*   Lipid Profile: No results for input(s): CHOL, HDL, LDLCALC, TRIG, CHOLHDL, LDLDIRECT in the last 72 hours. Thyroid Function Tests:  Recent Labs  08/22/16 0909  TSH 1.040   Anemia Panel: No results for input(s): VITAMINB12, FOLATE, FERRITIN, TIBC, IRON, RETICCTPCT in the last 72 hours. Sepsis Labs: No results for input(s): PROCALCITON, LATICACIDVEN in the last 168 hours.  Recent Results (from the past 240 hour(s))  MRSA PCR Screening     Status: None   Collection Time: 08/20/16  9:29 PM  Result Value Ref Range Status   MRSA by PCR NEGATIVE NEGATIVE Final    Comment:        The GeneXpert MRSA Assay (FDA approved for NASAL specimens only), is one component of a comprehensive MRSA colonization surveillance program. It is not intended to diagnose MRSA infection nor to guide or monitor treatment for MRSA infections.          Radiology Studies: No results found.      Scheduled Meds: . aspirin EC  81 mg Oral Daily  . carvedilol  12.5 mg Oral BID WC  . enoxaparin (LOVENOX) injection  40 mg Subcutaneous QHS  . furosemide  20 mg Intravenous BID  . insulin aspart  0-5 Units Subcutaneous QHS  . insulin aspart  0-9 Units Subcutaneous TID WC  . insulin aspart  3 Units Subcutaneous TID WC  . lisinopril  20 mg Oral Daily  . potassium chloride  20 mEq Oral Daily  . sodium chloride flush  3 mL Intravenous Q12H   Continuous Infusions:   LOS: 3 days      Deneice Wack Annett Gulaaniel Laquincy Eastridge, MD [facility] Pager [phone]  If 7PM-7AM, please contact night-coverage [email or web address] Password [email or web address] 08/23/2016, 6:38 PM

## 2016-08-23 NOTE — Progress Notes (Signed)
Inpatient Diabetes Program Recommendations  AACE/ADA: New Consensus Statement on Inpatient Glycemic Control (2015)  Target Ranges:  Prepandial:   less than 140 mg/dL      Peak postprandial:   less than 180 mg/dL (1-2 hours)      Critically ill patients:  140 - 180 mg/dL   Lab Results  Component Value Date   GLUCAP 126 (H) 08/23/2016   HGBA1C 7.0 (H) 08/21/2016    Review of Glycemic Control:  Results for Debbie Davis, Debbie A (MRN [id number]) as of 08/23/2016 10:41  Ref. Range 08/22/2016 11:14 08/22/2016 16:57 08/22/2016 22:08 08/23/2016 07:57  Glucose-Capillary Latest Ref Range: 65 - 99 mg/dL 409193 (H) 811136 (H) 914181 (H) 126 (H)    Diabetes history: Note new diagnosis of diabetes Outpatient Diabetes medications: None Current orders for Inpatient glycemic control: Novolog sensitive tid with meals and HS, Novolog 3 units tid with meals  Inpatient Diabetes Program Recommendations:    Ordered basic diabetes education including Living well with diabetes booklet and videos.  Will also speak with patient.  Likely will be d/c'd on oral diabetes medications.  Consider Metformin 500 mg bid.    Thanks, Beryl MeagerJenny Gaspar Fowle, RN, BC-ADM Inpatient Diabetes Coordinator Pager [phone] (8a-5p)

## 2016-08-24 LAB — BASIC METABOLIC PANEL
Anion gap: 9 (ref 5–15)
BUN: 29 mg/dL — ABNORMAL HIGH (ref 6–20)
CHLORIDE: 103 mmol/L (ref 101–111)
CO2: 27 mmol/L (ref 22–32)
CREATININE: 1.08 mg/dL — AB (ref 0.44–1.00)
Calcium: 9 mg/dL (ref 8.9–10.3)
GFR, EST NON AFRICAN AMERICAN: 57 mL/min — AB (ref >60)
Glucose, Bld: 147 mg/dL — ABNORMAL HIGH (ref 65–99)
Potassium: 4.1 mmol/L (ref 3.5–5.1)
SODIUM: 139 mmol/L (ref 135–145)

## 2016-08-24 LAB — GLUCOSE, CAPILLARY: GLUCOSE-CAPILLARY: 134 mg/dL — AB (ref 65–99)

## 2016-08-24 MED ORDER — HYDROCHLOROTHIAZIDE 25 MG PO TABS: 25.0000 mg | ORAL_TABLET | Freq: Every day | ORAL | 0 refills | Status: DC

## 2016-08-24 MED ORDER — METFORMIN HCL 500 MG PO TABS: 500.0000 mg | ORAL_TABLET | Freq: Two times a day (BID) | ORAL | 0 refills | Status: DC

## 2016-08-24 MED ORDER — ASPIRIN 81 MG PO TBEC: 81.0000 mg | DELAYED_RELEASE_TABLET | Freq: Every day | ORAL | 0 refills | Status: DC

## 2016-08-24 MED ORDER — LISINOPRIL 20 MG PO TABS: 20.0000 mg | ORAL_TABLET | Freq: Every day | ORAL | 0 refills | Status: DC

## 2016-08-24 NOTE — Discharge Summary (Signed)
Physician Discharge Summary  Debbie AsaBetty A Davis DGL:[id number]:[id number] DOB: 06/15/1961 DOA: 08/20/2016  PCP: Lupe Carneyean Mitchell, MD  Admit date: 08/20/2016 Discharge date: 08/24/2016  Admitted From:  Home  Disposition:   Home   Recommendations for Outpatient Follow-up:  1. Follow up with PCP in 1-week 2. Patient has been placed on metformin for T2DM 3. Hydrochlorothiazide dose increased from 12,5 to 25 mg daily.  4. CT chest with mild mediastinal adenopathy and mild emphysematous changes, will need outpatient full pulmonary function testing.   Home Health: No   Equipment/Devices: NA  Discharge Condition: Stable   CODE STATUS: Full  Diet recommendation: Heart Healthy / Carb Modified   Brief/Interim Summary: This is a 55 year old female who presented to the hospital with the chief complaint of dyspnea. Her symptoms have been worsening over last 2 months, associated with dyspnea on exertion and orthopnea, over last week prior to hospitalization she developed PND and lower extremity edema. Intermittent chest pain.  Patient was noncompliant with her antihypertensive regimen. On initial physical examination her blood pressure was 185/115, heart rate 126, respiratory rate 19, temperature 98.4, on supplemental oxygen. She was awake and alert, no JVD, lungs no rales or wheezing, heart S1-S2 present rhythmic, abdomen was soft nontender, lower extremities with positive edema. Sodium 138, potassium 3.1, chloride 103, BUN 16, creatinine 0.89, glucose 128. White count 9.8, hemoglobin 12.1, hematocrit 3.8, platelets 212. D-dimer less than 0.27. Chest x-ray with increased interstitial markings. EKG sinus tachycardia with significant left ventricular hypertrophy, per EKG criteria. CT chest with bilateral groundglass opacities, no evidence of PE, mild emphysematous changes in the upper lobes with mild mediastinal adenopathy.   Patient was admitted to hospital working diagnosis of acute hypoxic respiratory failure due to  hypertensive emergency in acute diastolic heart failure.   1. Acute hypoxic respiratory failure. Patient was admitted to the telemetry unit, she was placed on nitroglycerin infusion and IV furosemide. Patient's symptoms improved, negative fluid balance was achieved. Oximetry 98% on room air by the time of discharge. Pulmonary edema presumed to be related to acute heart failure related to hypertensive emergency. Echocardiogram showed left ventricle with ejection fraction 60-65%, grade 1 diastolic dysfunction, normal wall thickness.   2. Hypertensive emergency. Patient initially was diuresed, nitroglycerin drip was discontinued. Patient was resumed on ACE inhibitor with lisinopril 20 mg daily, and hydrochlorothiazide 25 g daily. Discharge blood pressure 136/88. Compliance with medical therapy was reinforced. Lower extremity Dopplers were negative for DVT.  3. Diabetes mellitus type 2. Patient with strong family history for diabetes mellitus, hemoglobin A1c 7.0. Patient was placed on insulin sliding scale during her hospitalization. Capillary glucose 135, 180, 134. Diabetic education was performed. Patient will be discharged on metformin 500 mg twice daily. With a close follow-up as an outpatient.  4. Mediastinal lymphadenopathy in the emphysematous changes on CT scan. Changes were described as mild, patient will continue albuterol as needed. Formal pulmonary function testing will be needed as an outpatient. Strongly recommend smoking cessation.      Discharge Diagnoses:  Principal Problem:   Acute respiratory failure with hypoxia (HCC) Active Problems:   Hypertensive urgency   Sinus tachycardia   Acute diastolic heart failure (HCC)   Diabetes type 2, uncontrolled (HCC)    Discharge Instructions  Discharge Instructions    Diet - low sodium heart healthy    Complete by:  As directed    Diet Carb Modified    Complete by:  As directed    Discharge instructions    Complete by:  As directed     Please follow with primary care in 7 days.   Increase activity slowly    Complete by:  As directed        Medication List    STOP taking these medications   lisinopril-hydrochlorothiazide 20-12.5 MG tablet Commonly known as:  PRINZIDE,ZESTORETIC   predniSONE 20 MG tablet Commonly known as:  DELTASONE     TAKE these medications   albuterol 108 (90 Base) MCG/ACT inhaler Commonly known as:  PROVENTIL HFA;VENTOLIN HFA Inhale 1-2 puffs into the lungs every 4 (four) hours as needed for wheezing or shortness of breath.   aspirin 81 MG EC tablet Take 1 tablet (81 mg total) by mouth daily. Start taking on:  08/25/2016   hydrochlorothiazide 25 MG tablet Commonly known as:  HYDRODIURIL Take 1 tablet (25 mg total) by mouth daily. Start taking on:  08/25/2016   hydrocortisone 25 MG suppository Commonly known as:  ANUSOL-HC Place 1 suppository (25 mg total) rectally 2 (two) times daily.   lisinopril 20 MG tablet Commonly known as:  PRINIVIL,ZESTRIL Take 1 tablet (20 mg total) by mouth daily.   metFORMIN 500 MG tablet Commonly known as:  GLUCOPHAGE Take 1 tablet (500 mg total) by mouth 2 (two) times daily with a meal.   multivitamin with minerals Tabs tablet Take 1 tablet by mouth daily.   naproxen sodium 220 MG tablet Commonly known as:  ANAPROX Take 220 mg by mouth every 12 (twelve) hours as needed (pain).       Allergies  Allergen Reactions  . Codeine Rash  . Morphine And Related Rash    Consultations:     Procedures/Studies: Dg Chest 2 View  Result Date: 08/12/2016 CLINICAL DATA:  C/o sob and cough x 1 month. Hx htn EXAM: CHEST - 2 VIEW COMPARISON:  07/15/2016 FINDINGS: Lungs are clear. Heart size and mediastinal contours are within normal limits. Patchy aortic calcifications. No effusion. Visualized bones unremarkable. IMPRESSION: 1. No acute findings. 2.  Aortic Atherosclerosis (ICD10-170.0) Electronically Signed   By: Corlis Leak M.D.   On: 08/12/2016  11:42   Ct Angio Chest Pe W And/or Wo Contrast  Result Date: 08/20/2016 CLINICAL DATA:  Persistent tachycardia and dyspnea EXAM: CT ANGIOGRAPHY CHEST WITH CONTRAST TECHNIQUE: Multidetector CT imaging of the chest was performed using the standard protocol during bolus administration of intravenous contrast. Multiplanar CT image reconstructions and MIPs were obtained to evaluate the vascular anatomy. CONTRAST:  60 mL Isovue 350 intravenous COMPARISON:  Chest x-ray 08/20/2016 FINDINGS: Cardiovascular: Suboptimal contrast opacification of the distal pulmonary arteries. There is no main or central segmental filling defect identified to suggest pulmonary embolus. There is no evidence for aortic dissection or mediastinal hematoma. There is atherosclerosis at the aortic arch. Heart size upper limits of normal. No pericardial effusion. Mediastinum/Nodes: Thyroid gland grossly unremarkable. Few prominent lymph nodes within the mediastinum measuring up to 1 cm in short axis. No axillary adenopathy. Mild air distention of the esophagus. Trachea and mainstem bronchi appear within normal limits. Lungs/Pleura: Mild emphysematous changes within the bilateral upper lobes. No consolidation or effusion. No pneumothorax. Upper Abdomen: Limited images of the upper abdomen demonstrate mild nodular enlargement of left adrenal gland without discrete mass. No acute abnormalities. Musculoskeletal: No acute osseous abnormality. Review of the MIP images confirms the above findings. IMPRESSION: 1. Suboptimal contrast opacification of lower order pulmonary arterial branches, no evidence for central or main segmental pulmonary embolus. No evidence for aortic dissection. 2. Mild emphysematous disease of the bilateral  upper lobes. 3. Mild mediastinal adenopathy. Electronically Signed   By: Jasmine Pang M.D.   On: 08/20/2016 20:10   Dg Chest Port 1 View  Result Date: 08/20/2016 CLINICAL DATA:  Acute on chronic shortness of breath EXAM:  PORTABLE CHEST 1 VIEW COMPARISON:  08/12/2016 FINDINGS: EKG leads create artifact over the chest. Prominence of the left ventricle without overall cardiomegaly. Aortic atherosclerosis. There is no edema, consolidation, effusion, or pneumothorax. IMPRESSION: No active disease. Electronically Signed   By: Marnee Spring M.D.   On: 08/20/2016 16:56      Subjective: Patient with no chest pain, no nausea or vomiting, no dyspnea.   Discharge Exam: Vitals:   08/23/16 2027 08/24/16 0445  BP: (!) 159/97 136/88  Pulse: (!) 101 100  Resp: 18 18  Temp: 98.9 F (37.2 C) 98.3 F (36.8 C)   Vitals:   08/23/16 1125 08/23/16 1557 08/23/16 2027 08/24/16 0445  BP: (!) 152/89 (!) 142/81 (!) 159/97 136/88  Pulse: 96 91 (!) 101 100  Resp: 19 18 18 18   Temp: 98.8 F (37.1 C) 98 F (36.7 C) 98.9 F (37.2 C) 98.3 F (36.8 C)  TempSrc: Oral Oral Oral Oral  SpO2: 100% 100% 100% 98%  Weight:    88.9 kg (195 lb 14.4 oz)  Height:        General: Pt is alert, awake, not in acute distress Cardiovascular: RRR, S1/S2 +, no rubs, no gallops Respiratory: CTA bilaterally, no wheezing, no rhonchi Abdominal: Soft, NT, ND, bowel sounds + Extremities: no edema, no cyanosis    The results of significant diagnostics from this hospitalization (including imaging, microbiology, ancillary and laboratory) are listed below for reference.     Microbiology: Recent Results (from the past 240 hour(s))  MRSA PCR Screening     Status: None   Collection Time: 08/20/16  9:29 PM  Result Value Ref Range Status   MRSA by PCR NEGATIVE NEGATIVE Final    Comment:        The GeneXpert MRSA Assay (FDA approved for NASAL specimens only), is one component of a comprehensive MRSA colonization surveillance program. It is not intended to diagnose MRSA infection nor to guide or monitor treatment for MRSA infections.      Labs: BNP (last 3 results)  Recent Labs  07/15/16 2320 08/12/16 1104 08/20/16 1655  BNP 32.5  14.9 33.0   Basic Metabolic Panel:  Recent Labs Lab 08/20/16 1655 08/20/16 2239 08/21/16 0621 08/22/16 0322 08/23/16 0534 08/24/16 0307  NA 138  --  139 138 139 139  K 3.1*  --  3.8 4.5 4.4 4.1  CL 103  --  103 107 105 103  CO2 26  --  27 23 25 27   GLUCOSE 128*  --  175* 146* 152* 147*  BUN 16  --  12 22* 22* 29*  CREATININE 0.89 1.02* 0.98 1.02* 1.07* 1.08*  CALCIUM 8.6*  --  8.9 8.9 9.0 9.0  MG  --  2.0  --   --   --   --    Liver Function Tests: No results for input(s): AST, ALT, ALKPHOS, BILITOT, PROT, ALBUMIN in the last 168 hours. No results for input(s): LIPASE, AMYLASE in the last 168 hours. No results for input(s): AMMONIA in the last 168 hours. CBC:  Recent Labs Lab 08/20/16 1655 08/20/16 2239 08/21/16 0621  WBC 9.8 10.2 7.7  HGB 12.1 12.4 12.1  HCT 38.8 39.3 39.0  MCV 87.4 86.4 87.2  PLT 212 207 206  Cardiac Enzymes:  Recent Labs Lab 08/20/16 2239 08/21/16 0621 08/21/16 1209  TROPONINI <0.03 <0.03 <0.03   BNP: Invalid input(s): POCBNP CBG:  Recent Labs Lab 08/23/16 0757 08/23/16 1128 08/23/16 1623 08/23/16 2213 08/24/16 0736  GLUCAP 126* 220* 135* 180* 134*   D-Dimer No results for input(s): DDIMER in the last 72 hours. Hgb A1c  Recent Labs  08/21/16 1209  HGBA1C 7.0*   Lipid Profile No results for input(s): CHOL, HDL, LDLCALC, TRIG, CHOLHDL, LDLDIRECT in the last 72 hours. Thyroid function studies  Recent Labs  08/22/16 0909  TSH 1.040   Anemia work up No results for input(s): VITAMINB12, FOLATE, FERRITIN, TIBC, IRON, RETICCTPCT in the last 72 hours. Urinalysis    Component Value Date/Time   COLORURINE AMBER (A) 12/04/2015 1104   APPEARANCEUR CLOUDY (A) 12/04/2015 1104   LABSPEC 1.030 12/04/2015 1104   PHURINE 5.5 12/04/2015 1104   GLUCOSEU NEGATIVE 12/04/2015 1104   HGBUR TRACE (A) 12/04/2015 1104   BILIRUBINUR SMALL (A) 12/04/2015 1104   BILIRUBINUR neg 05/03/2014 1011   KETONESUR NEGATIVE 12/04/2015 1104    PROTEINUR 100 (A) 12/04/2015 1104   UROBILINOGEN 0.2 05/03/2014 1011   NITRITE NEGATIVE 12/04/2015 1104   LEUKOCYTESUR SMALL (A) 12/04/2015 1104   Sepsis Labs Invalid input(s): PROCALCITONIN,  WBC,  LACTICIDVEN Microbiology Recent Results (from the past 240 hour(s))  MRSA PCR Screening     Status: None   Collection Time: 08/20/16  9:29 PM  Result Value Ref Range Status   MRSA by PCR NEGATIVE NEGATIVE Final    Comment:        The GeneXpert MRSA Assay (FDA approved for NASAL specimens only), is one component of a comprehensive MRSA colonization surveillance program. It is not intended to diagnose MRSA infection nor to guide or monitor treatment for MRSA infections.      Time coordinating discharge: 45 minutes  SIGNED:   Coralie KeensMauricio Daniel Arrien, MD  [facility] 08/24/2016, 10:28 AM Pager   If 7PM-7AM, please contact night-coverage [email or web address] Password [email or web address]

## 2016-08-24 NOTE — Progress Notes (Signed)
Patient verbalized understanding of discharge instructions. Patient able to teach information back. Patient requesting work excuse. Doctor notified.IV and telemetry removed

## 2016-11-08 ENCOUNTER — Ambulatory Visit
Admission: RE | Admit: 2016-11-08 | Discharge: 2016-11-08 | Disposition: A | Discharge status: Home / Self Care | Payer: BLUE CROSS/BLUE SHIELD | Source: Ambulatory Visit | Attending: Cardiology | Admitting: Cardiology

## 2016-11-08 ENCOUNTER — Encounter: Payer: Self-pay | Admitting: Cardiology

## 2016-11-08 ENCOUNTER — Other Ambulatory Visit: Payer: Self-pay | Admitting: Cardiology

## 2016-11-08 DIAGNOSIS — R0602 Shortness of breath: Secondary | ICD-10-CM

## 2016-11-11 NOTE — Consult Note (Signed)
Debbie Davis, [name]    Date of visit:  11/08/2016 DOB:  [birth year]    Age:  56 yrs. Medical record number:  [id number]     Account number:  [id number] Primary Care Provider: Tedra SenegalSCIFRES,DOROTHY FERGUSON ____________________________ CURRENT DIAGNOSES  1. Dyspnea  2. Chest pain  3. Hypertensive heart disease without heart failure  4. Atherosclerosis of aorta  5. Tachycardia, unspecified  6. Type 2 diabetes mellitus without complications  7. Obesity ____________________________ ALLERGIES  Lortab, Rash  Morphine, Rash ____________________________ MEDICATIONS  1. albuterol sulfate 90 mcg/actuation breath activated powder inhaler, PRN  2. metformin 500 mg tablet, BID  3. lisinopril 40 mg tablet, 1 p.o. daily  4. hydrochlorothiazide 25 mg tablet, 1 p.o. daily  5. amlodipine 5 mg tablet, 1 p.o. daily  6. Anoro Ellipta 62.5 mcg-25 mcg/actuation powder for inhalation, 1 puff Daily  7. furosemide 20 mg tablet, 1 p.o. daily  8. ergocalciferol (vitamin D2) 50,000 unit capsule, weekly  9. multivitamin chewable tablet, 1 p.o. daily  10. metoprolol succinate ER 50 mg tablet,extended release 24 hr, 1 p.o. daily ____________________________ CHIEF COMPLAINTS  evaluation of shortness of breath and BP ____________________________ HISTORY OF PRESENT ILLNESS This 56 year old black female is seen for evaluation of an abnormal EKG, dyspnea and chest discomfort and multiple cardiovascular risk factors. The patient has a previous history of hypertension. She rapidly took her blood pressure medicines and also smoked fairly heavily. She was admitted to the hospital in November with a hypertensive urgency and severe dyspnea and was thought to have some pulmonary edema although her BNP was low at that time. An echocardiogram showed hyperdynamic LV function with moderate to severe left ventricular hypertrophy (although the reports that she had normal wall thickness, I reviewed the echo personally). She had a nondiagnostic CT  angiogram but did show atherosclerosis of the aorta and there was incomplete visualization of the lower pulmonary arteries. Venous Dopplers were negative for DVT. Since discharge she has had continued dyspnea and her blood pressure medicines have been titrated. Blood pressure is under better control. 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 She complains of chest tightness and has to stop prior to walking less than 1/4 mile. She has severe dyspnea and fatigue and has persistent tachycardia. She evidently had some sort of pulmonary test done recently the primary doctor's office but we do not have records of these. She has gained a great deal of weight and is close to being morbidly obese at the present time. She has not been able to stop smoking and smokes less than a half a pack per day now and states that she share cigarettes with her son. She denies PND or orthopnea. She has a mild amount of edema. During the hospitalization mentioned above she was diagnosed with diabetes and was placed on metformin. ____________________________ PAST HISTORY  Past Medical Illnesses:  hypertension, DM-non-insulin dependent, kidney stones, anemia, peripheral neuropathy, obesity;  Cardiovascular Illnesses:  no previous history of cardiac disease;  Infectious Diseases:  no previous history of significant infectious diseases;  Surgical Procedures:  kidney stone removal, tubal ligation;  Trauma History:  no previous history of significant trauma;  NYHA Classification:  I;  Cardiology Procedures-Invasive:  no previous interventional or invasive cardiology procedures;  Cardiology Procedures-Noninvasive:  no previous non-invasive cardiovascular testing;  Peripheral Vascular Procedures:  no previous invasive peripheral vascular procedures.;  LVEF not documented,   ____________________________ CARDIO-PULMONARY TEST DATES EKG Date:  11/08/2016;   ____________________________ FAMILY  HISTORY Brother --  Brother dead, Premature coronary heart disease Brother -- Brother alive with problem, Hypertension, Diabetes mellitus Brother -- Hypertension, Brother dead, Premature coronary heart disease Brother -- Brother alive with problem, Hypertension, Diabetes mellitus Father -- Father dead, Myocardial infarction at greater than 60, CVA, Hypertension Mother -- Mother dead, Ovarian carcinoma Sister -- Sister alive with problem, Diabetes mellitus Sister -- Sister dead Sister -- Sister alive with problem, Diabetes mellitus Sister -- Sister dead, Tuberculosis Sister -- Sister alive with problem, Diabetes mellitus Sister -- Hypertension, Diabetes mellitus, Sister alive with problem Sister -- Sister alive and well ____________________________ SOCIAL HISTORY Alcohol Use:  occasionally and wine;  Smoking:  smokes less than 1 ppd;  Diet:  regular diet;  Lifestyle:  divorced and 4 children;  Exercise:  some exercise and walking;  Occupation:  CNA;  Residence:  stays with son;   ____________________________ REVIEW OF SYSTEMS General:  weight gain  Integumentary:no rashes or new skin lesions. Eyes: denies diplopia, history of glaucoma or visual problems. Ears, Nose, Throat, Mouth:  denies any hearing loss, epistaxis, hoarseness or difficulty speaking. Respiratory: dyspnea with exertion Cardiovascular:  please review HPI Abdominal: constipation and dyspepsiaGenitourinary-Female: no dysuria, urgency, frequency, UTIs, or stress incontinence Musculoskeletal:  denies arthritis, venous insufficiency, or muscle weakness Neurological:  occasional headaches Psychiatric:  denies depression or anxiety Hematological/Immunologic:  denies any food allergies, bleeding disorders. ____________________________ PHYSICAL EXAMINATION VITAL SIGNS  Blood Pressure:  140/90 Sitting, Right arm, large cuff  , 140/90 Standing, Right arm and large cuff   Pulse:  110/min. Weight:  200.00 lbs. Height:  60"BMI:  39  Constitutional:  pleasant African American female in no acute distress, severely obese Skin:  warm and dry to touch, no apparent skin lesions, or masses noted. Head:  normocephalic, normal hair pattern, no masses or tenderness Eyes:  EOMS Intact, PERRLA, C and S clear, Funduscopic exam not done. ENT:  ears, nose and throat reveal no gross abnormalities.  Dentition good. Neck:  supple, without massess. No JVD, thyromegaly or carotid bruits. Carotid upstroke normal. Chest:  normal symmetry, clear to auscultation. Cardiac:  regular rhythm, normal S1 and S2, No S3 or S4, no murmurs, gallops or rubs detected. Abdomen:  abdomen soft,non-tender, no masses, no hepatospenomegaly, or aneurysm noted Peripheral Pulses:  the femoral,dorsalis pedis, and posterior tibial pulses are full and equal bilaterally with no bruits auscultated. Extremities & Back:  no deformities, clubbing, cyanosis, erythema or edema observed. Normal muscle strength and tone. Neurological:  no gross motor or sensory deficits noted, affect appropriate, oriented x3. ____________________________ MOST RECENT LIPID PANEL 10/11/16  CHOL TOTL 179 mg/dl, LDL 88 NM, HDL 36 mg/dl, TRIGLYCER 540 mg/dl and CHOL/HDL 5.0 (Calc) ____________________________ IMPRESSIONS/PLAN  1. Significant dyspnea on exertion as well as chest discomfort suggestive of angina in a patient who is diabetic with multiple risk factors. 2. Hypertensive heart disease with diastolic dysfunction by echo 3. Type 2 diabetes without complications 4. Aortic atherosclerosis 5. Severe obesity close to being morbidly obese 6. Ongoing tobacco abuse advised to stop  Recommendations:  EKG today shows what looks like an anteroseptal infarction old, nonspecific changes inferiorly and voltage consistent with LVH. EKG was personally reviewed by me. She has multiple cardiovascular risk factors and is close to being morbidly obese. At this point it is unclear where the dyspnea  is due to ongoing lung disease, subsequent coronary artery disease and she does have evidence of preclinical atherosclerosis or hypertensive heart disease. I recommended the blood work as noted below. At this point I  like for her to have a repeat echocardiogram especially with the abnormal EKG with ST depression as well as a potential old anteroseptal infarction. She is really too large to do adequate myocardial perfusion imaging in light of the abnormal EKG and I would suggest that she just go directly to catheterization to evaluate whether she has significant coronary artery disease since she does have evidence of aortic atherosclerosis. I recommended that she have a chest x-ray. I pressor metoprolol succinate to 50 mg twice daily in light of the tachycardia. I would like to see her the results of any pulmonary testing. We discussed smoking cessation with the patient and the daughter. We also discussed cardiac catheterization including risks and she would be willing to proceed depending on approval. ____________________________ TODAYS ORDERS  1. 12 Lead EKG: Today  2. Chest X-ray PA/Lat: today  3. Return with ECHO: Y  4. 2D, color flow, doppler: First Available  5. Comprehensive Metabolic Panel: Today  6. Complete Blood Count: Today  7. BNP: Today  8. TSH: Today  9. CHEST XRAY: Today                       ____________________________ Cardiology Physician:  Darden Palmer MD [facility]

## 2016-11-13 ENCOUNTER — Encounter (HOSPITAL_COMMUNITY): Admission: RE | Payer: Self-pay | Source: Ambulatory Visit

## 2016-11-13 ENCOUNTER — Ambulatory Visit (HOSPITAL_COMMUNITY): Admission: RE | Admit: 2016-11-13 | Payer: BLUE CROSS/BLUE SHIELD | Source: Ambulatory Visit | Admitting: Cardiology

## 2016-11-13 SURGERY — LEFT HEART CATH AND CORONARY ANGIOGRAPHY
Anesthesia: LOCAL

## 2016-11-25 ENCOUNTER — Encounter (HOSPITAL_COMMUNITY): Payer: Self-pay | Admitting: Cardiology

## 2016-11-25 DIAGNOSIS — R0600 Dyspnea, unspecified: Secondary | ICD-10-CM

## 2016-11-25 DIAGNOSIS — I200 Unstable angina: Secondary | ICD-10-CM | POA: Insufficient documentation

## 2016-11-25 DIAGNOSIS — I700 Atherosclerosis of aorta: Secondary | ICD-10-CM

## 2016-11-25 DIAGNOSIS — E669 Obesity, unspecified: Secondary | ICD-10-CM

## 2016-11-25 DIAGNOSIS — I5032 Chronic diastolic (congestive) heart failure: Secondary | ICD-10-CM

## 2016-11-25 HISTORY — DX: Obesity, unspecified: E66.9

## 2016-11-25 HISTORY — DX: Chronic diastolic (congestive) heart failure: I50.32

## 2016-11-25 HISTORY — DX: Atherosclerosis of aorta: I70.0

## 2016-11-25 NOTE — Progress Notes (Signed)
Debbie Davis, Debbie Davis    Date of visit:  11/15/2016 DOB:  [birth year]    Age:  55 yrs. Medical record number:  [id number]     Account number:  [id number] Primary Care Provider: Tedra SenegalSCIFRES,DOROTHY FERGUSON ____________________________ CURRENT DIAGNOSES  1. Dyspnea  2. Chest pain  3. Hypertensive heart disease without heart failure  4. Atherosclerosis of aorta  5. Tachycardia, unspecified  6. Type 2 diabetes mellitus without complications  7. Obesity  8. Dyspnea ____________________________ ALLERGIES  Lortab, Rash  Morphine, Rash ____________________________ MEDICATIONS  1. albuterol sulfate 90 mcg/actuation breath activated powder inhaler, PRN  2. metformin 500 mg tablet, BID  3. hydrochlorothiazide 25 mg tablet, 1 p.o. daily  4. Anoro Ellipta 62.5 mcg-25 mcg/actuation powder for inhalation, 1 puff Daily  5. furosemide 20 mg tablet, 1 p.o. daily  6. ergocalciferol (vitamin D2) 50,000 unit capsule, weekly  7. multivitamin chewable tablet, 1 p.o. daily  8. metoprolol succinate ER 50 mg tablet,extended release 24 hr, BID  9. losartan 100 mg tablet, 1 p.o. daily  10. amlodipine 5 mg tablet, BID ____________________________ HISTORY OF PRESENT ILLNESS Patient returns for cardiac followup. She has not been able to stop smoking. She continues to complain of shortness of breath with activity and her blood pressure remains elevated. We got clearance for cardiac catheter she has not yet scheduled this at the present time. Complains of significant fatigue. Echocardiogram today shows moderate LVH with evidence of grade 1 diastolic function but with preserved ejection fraction. She denies PND or orthopnea. No edema at the present time.The patient also complains of severe problems with her hemorrhoids and I asked her to contact her primary doctor about this as conservative measures have not helped. ____________________________ PAST HISTORY  Past Medical Illnesses:  hypertension, DM-non-insulin dependent, kidney  stones, anemia, peripheral neuropathy, obesity;  Cardiovascular Illnesses:  no previous history of cardiac disease;  Infectious Diseases:  no previous history of significant infectious diseases;  Surgical Procedures:  kidney stone removal, tubal ligation;  Trauma History:  no previous history of significant trauma;  NYHA Classification:  I;  Cardiology Procedures-Invasive:  no previous interventional or invasive cardiology procedures;  Cardiology Procedures-Noninvasive:  no previous non-invasive cardiovascular testing;  Peripheral Vascular Procedures:  no previous invasive peripheral vascular procedures.;  LVEF of 65% documented via echocardiogram on 11/15/2016,   ____________________________ CARDIO-PULMONARY TEST DATES EKG Date:  11/08/2016;  Echocardiography Date: 11/15/2016;   ____________________________ FAMILY HISTORY Brother -- Brother dead, Premature coronary heart disease Brother -- Brother alive with problem, Hypertension, Diabetes mellitus Brother -- Hypertension, Brother dead, Premature coronary heart disease Brother -- Brother alive with problem, Hypertension, Diabetes mellitus Father -- Father dead, Myocardial infarction at greater than 60, CVA, Hypertension Mother -- Mother dead, Ovarian carcinoma Sister -- Sister alive with problem, Diabetes mellitus Sister -- Sister dead Sister -- Sister alive with problem, Diabetes mellitus Sister -- Sister dead, Tuberculosis Sister -- Sister alive with problem, Diabetes mellitus Sister -- Hypertension, Diabetes mellitus, Sister alive with problem Sister -- Sister alive and well ____________________________ SOCIAL HISTORY Alcohol Use:  occasionally and wine;  Smoking:  smokes less than 1 ppd;  Diet:  regular diet;  Lifestyle:  divorced and 4 children;  Exercise:  some exercise and walking;  Occupation:  CNA;  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 Weight:  200.00 lbs. Height:  60"BMI: 39  Constitutional:  pleasant African American female in no acute distress, severely obese Skin:  warm and dry to touch, no apparent skin lesions, or masses noted. Head:  normocephalic, normal hair pattern, no masses or tenderness Neck:  supple, without massess. No JVD, thyromegaly or carotid bruits. Carotid upstroke normal. Chest:  normal symmetry, clear to auscultation. Cardiac:  regular rhythm, normal S1 and S2, No S3 or S4, no murmurs, gallops or rubs detected. Extremities & Back:  no deformities, clubbing, cyanosis, erythema or edema observed. Normal muscle strength and tone. Neurological:  no gross motor or sensory deficits noted, affect appropriate, oriented x3. ____________________________ MOST RECENT LIPID PANEL 10/11/16  CHOL TOTL 179 mg/dl, LDL 88 NM, HDL 36 mg/dl, TRIGLYCER 161 mg/dl and CHOL/HDL 5.0 (Calc) ____________________________ IMPRESSIONS/PLAN  1. Continued exertional dyspnea with an abnormal EKG and multiple risk factors 2. Hypertensive heart disease with diastolic dysfunction not well-controlled 3. Severe obesity  Recommendations:  Increase amlodipine 5 mg twice daily. Discussed importance of stopping smoking with her as well as efforts to lose weight. Followup after catheterization hopefully we'll get this scheduled soon. ____________________________ TODAYS ORDERS  1. Return Visit: 1 month                       ____________________________ Cardiology Physician:  Darden Palmer MD [facility]

## 2016-11-26 ENCOUNTER — Ambulatory Visit (HOSPITAL_COMMUNITY)
Admission: RE | Admit: 2016-11-26 | Discharge: 2016-11-26 | Disposition: A | Discharge status: Home / Self Care | Payer: BLUE CROSS/BLUE SHIELD | Source: Ambulatory Visit | Attending: Cardiology | Admitting: Cardiology

## 2016-11-26 ENCOUNTER — Encounter (HOSPITAL_COMMUNITY)
Admission: RE | Disposition: A | Discharge status: Home / Self Care | Payer: Self-pay | Source: Ambulatory Visit | Attending: Cardiology

## 2016-11-26 DIAGNOSIS — Z823 Family history of stroke: Secondary | ICD-10-CM | POA: Diagnosis not present

## 2016-11-26 DIAGNOSIS — I110 Hypertensive heart disease with heart failure: Secondary | ICD-10-CM | POA: Insufficient documentation

## 2016-11-26 DIAGNOSIS — R000 Tachycardia, unspecified: Secondary | ICD-10-CM | POA: Diagnosis not present

## 2016-11-26 DIAGNOSIS — E1142 Type 2 diabetes mellitus with diabetic polyneuropathy: Secondary | ICD-10-CM | POA: Insufficient documentation

## 2016-11-26 DIAGNOSIS — I5030 Unspecified diastolic (congestive) heart failure: Secondary | ICD-10-CM | POA: Diagnosis not present

## 2016-11-26 DIAGNOSIS — Z8249 Family history of ischemic heart disease and other diseases of the circulatory system: Secondary | ICD-10-CM | POA: Insufficient documentation

## 2016-11-26 DIAGNOSIS — I5032 Chronic diastolic (congestive) heart failure: Secondary | ICD-10-CM

## 2016-11-26 DIAGNOSIS — I700 Atherosclerosis of aorta: Secondary | ICD-10-CM

## 2016-11-26 DIAGNOSIS — D649 Anemia, unspecified: Secondary | ICD-10-CM | POA: Diagnosis not present

## 2016-11-26 DIAGNOSIS — F17210 Nicotine dependence, cigarettes, uncomplicated: Secondary | ICD-10-CM | POA: Insufficient documentation

## 2016-11-26 DIAGNOSIS — I200 Unstable angina: Secondary | ICD-10-CM | POA: Insufficient documentation

## 2016-11-26 DIAGNOSIS — R0600 Dyspnea, unspecified: Secondary | ICD-10-CM

## 2016-11-26 DIAGNOSIS — I119 Hypertensive heart disease without heart failure: Secondary | ICD-10-CM | POA: Diagnosis present

## 2016-11-26 DIAGNOSIS — Z7984 Long term (current) use of oral hypoglycemic drugs: Secondary | ICD-10-CM | POA: Insufficient documentation

## 2016-11-26 DIAGNOSIS — E669 Obesity, unspecified: Secondary | ICD-10-CM

## 2016-11-26 DIAGNOSIS — Z6839 Body mass index (BMI) 39.0-39.9, adult: Secondary | ICD-10-CM | POA: Insufficient documentation

## 2016-11-26 DIAGNOSIS — Z833 Family history of diabetes mellitus: Secondary | ICD-10-CM | POA: Insufficient documentation

## 2016-11-26 HISTORY — DX: Hypertensive heart disease without heart failure: I11.9

## 2016-11-26 HISTORY — PX: RIGHT/LEFT HEART CATH AND CORONARY ANGIOGRAPHY: CATH118266

## 2016-11-26 HISTORY — DX: Atherosclerosis of aorta: I70.0

## 2016-11-26 HISTORY — DX: Obesity, unspecified: E66.9

## 2016-11-26 HISTORY — DX: Chronic diastolic (congestive) heart failure: I50.32

## 2016-11-26 LAB — BASIC METABOLIC PANEL
ANION GAP: 12 (ref 5–15)
BUN: 22 mg/dL — ABNORMAL HIGH (ref 6–20)
CALCIUM: 9.6 mg/dL (ref 8.9–10.3)
CO2: 24 mmol/L (ref 22–32)
CREATININE: 1.22 mg/dL — AB (ref 0.44–1.00)
Chloride: 101 mmol/L (ref 101–111)
GFR calc non Af Amer: 49 mL/min — ABNORMAL LOW (ref >60)
GFR, EST AFRICAN AMERICAN: 57 mL/min — AB (ref >60)
Glucose, Bld: 170 mg/dL — ABNORMAL HIGH (ref 65–99)
Potassium: 4 mmol/L (ref 3.5–5.1)
SODIUM: 137 mmol/L (ref 135–145)

## 2016-11-26 LAB — CBC
HCT: 39.4 % (ref 36.0–46.0)
Hemoglobin: 12.3 g/dL (ref 12.0–15.0)
MCH: 26.2 pg (ref 26.0–34.0)
MCHC: 31.2 g/dL (ref 30.0–36.0)
MCV: 84 fL (ref 78.0–100.0)
PLATELETS: 246 10*3/uL (ref 150–400)
RBC: 4.69 MIL/uL (ref 3.87–5.11)
RDW: 16.2 % — ABNORMAL HIGH (ref 11.5–15.5)
WBC: 8.8 10*3/uL (ref 4.0–10.5)

## 2016-11-26 LAB — POCT I-STAT 3, ART BLOOD GAS (G3+)
ACID-BASE DEFICIT: 2 mmol/L (ref 0.0–2.0)
BICARBONATE: 24.1 mmol/L (ref 20.0–28.0)
O2 SAT: 98 %
PH ART: 7.324 — AB (ref 7.350–7.450)
TCO2: 25 mmol/L (ref 0–100)
pCO2 arterial: 46.3 mmHg (ref 32.0–48.0)
pO2, Arterial: 113 mmHg — ABNORMAL HIGH (ref 83.0–108.0)

## 2016-11-26 LAB — PROTIME-INR
INR: 0.89
PROTHROMBIN TIME: 12.1 s (ref 11.4–15.2)

## 2016-11-26 LAB — POCT I-STAT 3, VENOUS BLOOD GAS (G3P V)
ACID-BASE DEFICIT: 2 mmol/L (ref 0.0–2.0)
BICARBONATE: 24.3 mmol/L (ref 20.0–28.0)
O2 SAT: 70 %
PCO2 VEN: 48.9 mmHg (ref 44.0–60.0)
PO2 VEN: 41 mmHg (ref 32.0–45.0)
TCO2: 26 mmol/L (ref 0–100)
pH, Ven: 7.304 (ref 7.250–7.430)

## 2016-11-26 LAB — GLUCOSE, CAPILLARY: Glucose-Capillary: 166 mg/dL — ABNORMAL HIGH (ref 65–99)

## 2016-11-26 SURGERY — RIGHT/LEFT HEART CATH AND CORONARY ANGIOGRAPHY
Anesthesia: LOCAL

## 2016-11-26 MED ORDER — SODIUM CHLORIDE 0.9 % IV SOLN: 250.0000 mL | INTRAVENOUS | Status: DC | PRN

## 2016-11-26 MED ORDER — HEPARIN (PORCINE) IN NACL 2-0.9 UNIT/ML-% IJ SOLN
INTRAMUSCULAR | Status: DC | PRN
  Administered 2016-11-26: 1500 mL

## 2016-11-26 MED ORDER — IOPAMIDOL (ISOVUE-370) INJECTION 76%
INTRAVENOUS | Status: DC | PRN
  Administered 2016-11-26: 60 mL via INTRA_ARTERIAL

## 2016-11-26 MED ORDER — VERAPAMIL HCL 2.5 MG/ML IV SOLN
INTRAVENOUS | Status: DC | PRN
  Administered 2016-11-26: 10 mL via INTRA_ARTERIAL

## 2016-11-26 MED ORDER — SODIUM CHLORIDE 0.9 % WEIGHT BASED INFUSION
3.0000 mL/kg/h | INTRAVENOUS | Status: AC
  Administered 2016-11-26: 3 mL/kg/h via INTRAVENOUS

## 2016-11-26 MED ORDER — SODIUM CHLORIDE 0.9 % IV SOLN
INTRAVENOUS | Status: DC
Start: 2016-11-26 — End: 2016-11-26

## 2016-11-26 MED ORDER — ACETAMINOPHEN 325 MG PO TABS: 650.0000 mg | ORAL_TABLET | ORAL | Status: DC | PRN

## 2016-11-26 MED ORDER — SODIUM CHLORIDE 0.9% FLUSH: 3.0000 mL | Freq: Two times a day (BID) | INTRAVENOUS | Status: DC

## 2016-11-26 MED ORDER — HEPARIN SODIUM (PORCINE) 1000 UNIT/ML IJ SOLN
INTRAMUSCULAR | Status: AC
  Filled 2016-11-26: qty 1

## 2016-11-26 MED ORDER — HEPARIN (PORCINE) IN NACL 2-0.9 UNIT/ML-% IJ SOLN
INTRAMUSCULAR | Status: AC
  Filled 2016-11-26: qty 1000

## 2016-11-26 MED ORDER — LABETALOL HCL 5 MG/ML IV SOLN
INTRAVENOUS | Status: AC
  Filled 2016-11-26: qty 4

## 2016-11-26 MED ORDER — FENTANYL CITRATE (PF) 100 MCG/2ML IJ SOLN
INTRAMUSCULAR | Status: DC | PRN
  Administered 2016-11-26: 50 ug via INTRAVENOUS

## 2016-11-26 MED ORDER — SODIUM CHLORIDE 0.9% FLUSH: 3.0000 mL | INTRAVENOUS | Status: DC | PRN

## 2016-11-26 MED ORDER — VERAPAMIL HCL 2.5 MG/ML IV SOLN
INTRAVENOUS | Status: AC
  Filled 2016-11-26: qty 2

## 2016-11-26 MED ORDER — LABETALOL HCL 5 MG/ML IV SOLN
INTRAVENOUS | Status: DC | PRN
  Administered 2016-11-26: 10 mg via INTRAVENOUS

## 2016-11-26 MED ORDER — HEPARIN (PORCINE) IN NACL 2-0.9 UNIT/ML-% IJ SOLN
INTRAMUSCULAR | Status: AC
  Filled 2016-11-26: qty 500

## 2016-11-26 MED ORDER — LIDOCAINE HCL (PF) 1 % IJ SOLN
INTRAMUSCULAR | Status: DC | PRN
  Administered 2016-11-26 (×2): 2 mL via SUBCUTANEOUS

## 2016-11-26 MED ORDER — FENTANYL CITRATE (PF) 100 MCG/2ML IJ SOLN
INTRAMUSCULAR | Status: AC
  Filled 2016-11-26: qty 2

## 2016-11-26 MED ORDER — ASPIRIN 81 MG PO CHEW
81.0000 mg | CHEWABLE_TABLET | ORAL | Status: AC
Start: 2016-11-26 — End: 2016-11-26
  Administered 2016-11-26: 81 mg via ORAL

## 2016-11-26 MED ORDER — FUROSEMIDE 10 MG/ML IJ SOLN
20.0000 mg | Freq: Once | INTRAMUSCULAR | Status: AC
  Administered 2016-11-26: 20 mg via INTRAVENOUS

## 2016-11-26 MED ORDER — MIDAZOLAM HCL 2 MG/2ML IJ SOLN
INTRAMUSCULAR | Status: AC
  Filled 2016-11-26: qty 2

## 2016-11-26 MED ORDER — MIDAZOLAM HCL 2 MG/2ML IJ SOLN
INTRAMUSCULAR | Status: DC | PRN
  Administered 2016-11-26: 2 mg via INTRAVENOUS

## 2016-11-26 MED ORDER — FUROSEMIDE 10 MG/ML IJ SOLN
INTRAMUSCULAR | Status: AC
  Administered 2016-11-26: 20 mg via INTRAVENOUS
  Filled 2016-11-26: qty 4

## 2016-11-26 MED ORDER — IOPAMIDOL (ISOVUE-370) INJECTION 76%
INTRAVENOUS | Status: AC
  Filled 2016-11-26: qty 100

## 2016-11-26 MED ORDER — ASPIRIN 81 MG PO CHEW
CHEWABLE_TABLET | ORAL | Status: AC
  Administered 2016-11-26: 81 mg via ORAL
  Filled 2016-11-26: qty 1

## 2016-11-26 MED ORDER — FUROSEMIDE 20 MG PO TABS
20.0000 mg | ORAL_TABLET | Freq: Every day | ORAL | Status: DC
  Filled 2016-11-26: qty 1

## 2016-11-26 MED ORDER — SODIUM CHLORIDE 0.9 % WEIGHT BASED INFUSION: 1.0000 mL/kg/h | INTRAVENOUS | Status: DC

## 2016-11-26 MED ORDER — HEPARIN SODIUM (PORCINE) 1000 UNIT/ML IJ SOLN
INTRAMUSCULAR | Status: DC | PRN
  Administered 2016-11-26: 5000 [IU] via INTRAVENOUS

## 2016-11-26 MED ORDER — LIDOCAINE HCL (PF) 1 % IJ SOLN
INTRAMUSCULAR | Status: AC
  Filled 2016-11-26: qty 30

## 2016-11-26 MED ORDER — ONDANSETRON HCL 4 MG/2ML IJ SOLN: 4.0000 mg | Freq: Four times a day (QID) | INTRAMUSCULAR | Status: DC | PRN

## 2016-11-26 SURGICAL SUPPLY — 11 items
CATH BALLN WEDGE 5F 110CM (CATHETERS) ×1 IMPLANT
CATH INFINITI 5FR ANG PIGTAIL (CATHETERS) ×1 IMPLANT
DEVICE RAD COMP TR BAND LRG (VASCULAR PRODUCTS) ×1 IMPLANT
GLIDESHEATH SLEND A-KIT 6F 22G (SHEATH) ×1 IMPLANT
GUIDEWIRE INQWIRE 1.5J.035X260 (WIRE) IMPLANT
INQWIRE 1.5J .035X260CM (WIRE) ×2
KIT HEART LEFT (KITS) ×2 IMPLANT
PACK CARDIAC CATHETERIZATION (CUSTOM PROCEDURE TRAY) ×2 IMPLANT
TRANSDUCER W/STOPCOCK (MISCELLANEOUS) ×2 IMPLANT
TUBING CIL FLEX 10 FLL-RA (TUBING) ×2 IMPLANT
WIRE EMERALD 3MM-J .025X260CM (WIRE) ×1 IMPLANT

## 2016-11-26 NOTE — Interval H&P Note (Signed)
History and Physical Interval Note:  11/26/2016 10:25 AM  Debbie Davis  has presented today for surgery, with the diagnosis of unstable angina (exertional dyspnea with abnormal EKG)  The various methods of treatment have been discussed with the patient and family. After consideration of risks, benefits and other options for treatment, the patient has consented to  Procedure(s): Right/Left Heart Cath and Coronary Angiography (N/A) as a surgical intervention .  The patient's history has been reviewed, patient examined, no change in status, stable for surgery.  I have reviewed the patient's chart and labs.  Questions were answered to the patient's satisfaction.    Cath Lab Visit (complete for each Cath Lab visit)  Clinical Evaluation Leading to the Procedure:   ACS: No.  Non-ACS:    Anginal Classification: CCS III  Anti-ischemic medical therapy: Maximal Therapy (2 or more classes of medications)  Non-Invasive Test Results: No non-invasive testing performed  Prior CABG: No previous CABG   Bryan Lemmaavid Harding

## 2016-11-26 NOTE — Discharge Instructions (Signed)
Radial Site Care °Refer to this sheet in the next few weeks. These instructions provide you with information about caring for yourself after your procedure. Your health care provider may also give you more specific instructions. Your treatment has been planned according to current medical practices, but problems sometimes occur. Call your health care provider if you have any problems or questions after your procedure. °What can I expect after the procedure? °After your procedure, it is typical to have the following: °· Bruising at the radial site that usually fades within 1-2 weeks. °· Blood collecting in the tissue (hematoma) that may be painful to the touch. It should usually decrease in size and tenderness within 1-2 weeks. °Follow these instructions at home: °· Take medicines only as directed by your health care provider. °· You may shower 24-48 hours after the procedure or as directed by your health care provider. Remove the bandage (dressing) and gently wash the site with plain soap and water. Pat the area dry with a clean towel. Do not rub the site, because this may cause bleeding. °· Do not take baths, swim, or use a hot tub until your health care provider approves. °· Check your insertion site every day for redness, swelling, or drainage. °· Do not apply powder or lotion to the site. °· Do not flex or bend the affected arm for 24 hours or as directed by your health care provider. °· Do not push or pull heavy objects with the affected arm for 24 hours or as directed by your health care provider. °· Do not lift over 10 lb (4.5 kg) for 5 days after your procedure or as directed by your health care provider. °· Ask your health care provider when it is okay to: °¨ Return to work or school. °¨ Resume usual physical activities or sports. °¨ Resume sexual activity. °· Do not drive home if you are discharged the same day as the procedure. Have someone else drive you. °· You may drive 24 hours after the procedure  unless otherwise instructed by your health care provider. °· Do not operate machinery or power tools for 24 hours after the procedure. °· If your procedure was done as an outpatient procedure, which means that you went home the same day as your procedure, a responsible adult should be with you for the first 24 hours after you arrive home. °· Keep all follow-up visits as directed by your health care provider. This is important. °Contact a health care provider if: °· You have a fever. °· You have chills. °· You have increased bleeding from the radial site. Hold pressure on the site. °Get help right away if: °· You have unusual pain at the radial site. °· You have redness, warmth, or swelling at the radial site. °· You have drainage (other than a small amount of blood on the dressing) from the radial site. °· The radial site is bleeding, and the bleeding does not stop after 30 minutes of holding steady pressure on the site. °· Your arm or hand becomes pale, cool, tingly, or numb. °This information is not intended to replace advice given to you by your health care provider. Make sure you discuss any questions you have with your health care provider. °Document Released: 10/19/2010 Document Revised: 02/22/2016 Document Reviewed: 04/04/2014 °Elsevier Interactive Patient Education © 2017 [employer] ° °

## 2016-11-26 NOTE — H&P (View-Only) (Signed)
Debbie Davis, Debbie Davis    Date of visit:  11/15/2016 DOB:  [birth year]    Age:  55 yrs. Medical record number:  [id number]     Account number:  [id number] Primary Care Provider: Tedra SenegalSCIFRES,DOROTHY FERGUSON ____________________________ CURRENT DIAGNOSES  1. Dyspnea  2. Chest pain  3. Hypertensive heart disease without heart failure  4. Atherosclerosis of aorta  5. Tachycardia, unspecified  6. Type 2 diabetes mellitus without complications  7. Obesity  8. Dyspnea ____________________________ ALLERGIES  Lortab, Rash  Morphine, Rash ____________________________ MEDICATIONS  1. albuterol sulfate 90 mcg/actuation breath activated powder inhaler, PRN  2. metformin 500 mg tablet, BID  3. hydrochlorothiazide 25 mg tablet, 1 p.o. daily  4. Anoro Ellipta 62.5 mcg-25 mcg/actuation powder for inhalation, 1 puff Daily  5. furosemide 20 mg tablet, 1 p.o. daily  6. ergocalciferol (vitamin D2) 50,000 unit capsule, weekly  7. multivitamin chewable tablet, 1 p.o. daily  8. metoprolol succinate ER 50 mg tablet,extended release 24 hr, BID  9. losartan 100 mg tablet, 1 p.o. daily  10. amlodipine 5 mg tablet, BID ____________________________ HISTORY OF PRESENT ILLNESS Patient returns for cardiac followup. She has not been able to stop smoking. She continues to complain of shortness of breath with activity and her blood pressure remains elevated. We got clearance for cardiac catheter she has not yet scheduled this at the present time. Complains of significant fatigue. Echocardiogram today shows moderate LVH with evidence of grade 1 diastolic function but with preserved ejection fraction. She denies PND or orthopnea. No edema at the present time.The patient also complains of severe problems with her hemorrhoids and I asked her to contact her primary doctor about this as conservative measures have not helped. ____________________________ PAST HISTORY  Past Medical Illnesses:  hypertension, DM-non-insulin dependent, kidney  stones, anemia, peripheral neuropathy, obesity;  Cardiovascular Illnesses:  no previous history of cardiac disease;  Infectious Diseases:  no previous history of significant infectious diseases;  Surgical Procedures:  kidney stone removal, tubal ligation;  Trauma History:  no previous history of significant trauma;  NYHA Classification:  I;  Cardiology Procedures-Invasive:  no previous interventional or invasive cardiology procedures;  Cardiology Procedures-Noninvasive:  no previous non-invasive cardiovascular testing;  Peripheral Vascular Procedures:  no previous invasive peripheral vascular procedures.;  LVEF of 65% documented via echocardiogram on 11/15/2016,   ____________________________ CARDIO-PULMONARY TEST DATES EKG Date:  11/08/2016;  Echocardiography Date: 11/15/2016;   ____________________________ FAMILY HISTORY Brother -- Brother dead, Premature coronary heart disease Brother -- Brother alive with problem, Hypertension, Diabetes mellitus Brother -- Hypertension, Brother dead, Premature coronary heart disease Brother -- Brother alive with problem, Hypertension, Diabetes mellitus Father -- Father dead, Myocardial infarction at greater than 60, CVA, Hypertension Mother -- Mother dead, Ovarian carcinoma Sister -- Sister alive with problem, Diabetes mellitus Sister -- Sister dead Sister -- Sister alive with problem, Diabetes mellitus Sister -- Sister dead, Tuberculosis Sister -- Sister alive with problem, Diabetes mellitus Sister -- Hypertension, Diabetes mellitus, Sister alive with problem Sister -- Sister alive and well ____________________________ SOCIAL HISTORY Alcohol Use:  occasionally and wine;  Smoking:  smokes less than 1 ppd;  Diet:  regular diet;  Lifestyle:  divorced and 4 children;  Exercise:  some exercise and walking;  Occupation:  CNA;  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 Weight:  200.00 lbs. Height:  60"BMI: 39  Constitutional:  pleasant African American female in no acute distress, severely obese Skin:  warm and dry to touch, no apparent skin lesions, or masses noted. Head:  normocephalic, normal hair pattern, no masses or tenderness Neck:  supple, without massess. No JVD, thyromegaly or carotid bruits. Carotid upstroke normal. Chest:  normal symmetry, clear to auscultation. Cardiac:  regular rhythm, normal S1 and S2, No S3 or S4, no murmurs, gallops or rubs detected. Extremities & Back:  no deformities, clubbing, cyanosis, erythema or edema observed. Normal muscle strength and tone. Neurological:  no gross motor or sensory deficits noted, affect appropriate, oriented x3. ____________________________ MOST RECENT LIPID PANEL 10/11/16  CHOL TOTL 179 mg/dl, LDL 88 NM, HDL 36 mg/dl, TRIGLYCER 161 mg/dl and CHOL/HDL 5.0 (Calc) ____________________________ IMPRESSIONS/PLAN  1. Continued exertional dyspnea with an abnormal EKG and multiple risk factors 2. Hypertensive heart disease with diastolic dysfunction not well-controlled 3. Severe obesity  Recommendations:  Increase amlodipine 5 mg twice daily. Discussed importance of stopping smoking with her as well as efforts to lose weight. Followup after catheterization hopefully we'll get this scheduled soon. ____________________________ TODAYS ORDERS  1. Return Visit: 1 month                       ____________________________ Cardiology Physician:  Darden Palmer MD [facility]

## 2016-11-27 ENCOUNTER — Encounter (HOSPITAL_COMMUNITY): Payer: Self-pay | Admitting: Cardiology

## 2017-04-02 ENCOUNTER — Emergency Department (HOSPITAL_COMMUNITY): Payer: BLUE CROSS/BLUE SHIELD

## 2017-04-02 ENCOUNTER — Encounter (HOSPITAL_COMMUNITY): Payer: Self-pay

## 2017-04-02 ENCOUNTER — Emergency Department (HOSPITAL_COMMUNITY)
Admission: EM | Admit: 2017-04-02 | Discharge: 2017-04-02 | Disposition: A | Discharge status: Home / Self Care | Payer: BLUE CROSS/BLUE SHIELD | Attending: Emergency Medicine | Admitting: Emergency Medicine

## 2017-04-02 DIAGNOSIS — Z7984 Long term (current) use of oral hypoglycemic drugs: Secondary | ICD-10-CM | POA: Insufficient documentation

## 2017-04-02 DIAGNOSIS — I5032 Chronic diastolic (congestive) heart failure: Secondary | ICD-10-CM | POA: Diagnosis not present

## 2017-04-02 DIAGNOSIS — R0602 Shortness of breath: Secondary | ICD-10-CM

## 2017-04-02 DIAGNOSIS — I160 Hypertensive urgency: Secondary | ICD-10-CM

## 2017-04-02 DIAGNOSIS — E1165 Type 2 diabetes mellitus with hyperglycemia: Secondary | ICD-10-CM | POA: Insufficient documentation

## 2017-04-02 DIAGNOSIS — I110 Hypertensive heart disease with heart failure: Secondary | ICD-10-CM | POA: Insufficient documentation

## 2017-04-02 DIAGNOSIS — F17210 Nicotine dependence, cigarettes, uncomplicated: Secondary | ICD-10-CM | POA: Insufficient documentation

## 2017-04-02 DIAGNOSIS — R739 Hyperglycemia, unspecified: Secondary | ICD-10-CM

## 2017-04-02 LAB — CBC WITH DIFFERENTIAL/PLATELET
BASOS ABS: 0 10*3/uL (ref 0.0–0.1)
Basophils Relative: 0 %
EOS PCT: 1 %
Eosinophils Absolute: 0.1 10*3/uL (ref 0.0–0.7)
HEMATOCRIT: 37.3 % (ref 36.0–46.0)
HEMOGLOBIN: 12 g/dL (ref 12.0–15.0)
LYMPHS ABS: 1.3 10*3/uL (ref 0.7–4.0)
LYMPHS PCT: 17 %
MCH: 26.7 pg (ref 26.0–34.0)
MCHC: 32.2 g/dL (ref 30.0–36.0)
MCV: 83.1 fL (ref 78.0–100.0)
Monocytes Absolute: 0.5 10*3/uL (ref 0.1–1.0)
Monocytes Relative: 7 %
NEUTROS ABS: 5.7 10*3/uL (ref 1.7–7.7)
Neutrophils Relative %: 75 %
PLATELETS: 232 10*3/uL (ref 150–400)
RBC: 4.49 MIL/uL (ref 3.87–5.11)
RDW: 16.2 % — ABNORMAL HIGH (ref 11.5–15.5)
WBC: 7.6 10*3/uL (ref 4.0–10.5)

## 2017-04-02 LAB — CBG MONITORING, ED: GLUCOSE-CAPILLARY: 267 mg/dL — AB (ref 65–99)

## 2017-04-02 LAB — BASIC METABOLIC PANEL
ANION GAP: 9 (ref 5–15)
BUN: 13 mg/dL (ref 6–20)
CHLORIDE: 102 mmol/L (ref 101–111)
CO2: 25 mmol/L (ref 22–32)
Calcium: 9 mg/dL (ref 8.9–10.3)
Creatinine, Ser: 1.12 mg/dL — ABNORMAL HIGH (ref 0.44–1.00)
GFR calc Af Amer: >60 mL/min (ref >60)
GFR calc non Af Amer: 54 mL/min — ABNORMAL LOW (ref >60)
GLUCOSE: 314 mg/dL — AB (ref 65–99)
POTASSIUM: 4.5 mmol/L (ref 3.5–5.1)
Sodium: 136 mmol/L (ref 135–145)

## 2017-04-02 LAB — BRAIN NATRIURETIC PEPTIDE: B NATRIURETIC PEPTIDE 5: 12.2 pg/mL (ref 0.0–100.0)

## 2017-04-02 LAB — I-STAT TROPONIN, ED: Troponin i, poc: 0.01 ng/mL (ref 0.00–0.08)

## 2017-04-02 MED ORDER — ALBUTEROL SULFATE (2.5 MG/3ML) 0.083% IN NEBU
5.0000 mg | INHALATION_SOLUTION | Freq: Once | RESPIRATORY_TRACT | Status: DC
  Filled 2017-04-02: qty 6

## 2017-04-02 MED ORDER — LABETALOL HCL 5 MG/ML IV SOLN
20.0000 mg | Freq: Once | INTRAVENOUS | Status: AC
  Administered 2017-04-02: 20 mg via INTRAVENOUS
  Filled 2017-04-02: qty 4

## 2017-04-02 NOTE — ED Triage Notes (Signed)
Pt complains of being short of breath that started about 2 hours ago, she denies andy asthma history  Pt is unable to sit back in the chair

## 2017-04-02 NOTE — ED Provider Notes (Signed)
[facility]-EMERGENCY DEPT Provider Note: Debbie Davis Debbie Dethlefs, MD, FACEP  CSN: [id number] MRN: [id number] ARRIVAL: 04/02/17 at 0108 ROOM: WA10/WA10   CHIEF COMPLAINT  Shortness of Breath   HISTORY OF PRESENT ILLNESS  Debbie Davis is a 56 y.o. female with a history of hypertension and chronic diastolic heart failure. She is here with the acute onset of dyspnea that began 2 hours prior to arrival. Symptoms are moderate to severe. They're worse when lying supine. There is somewhat worse with exertion. She denies chest pain. She states she has been compliant with her medications, put on requestioning admit she did not take her evening medication doses yesterday including metoprolol. She has chronic edema of her lower legs. She was noted to be hypertensive, tachycardic and tachypneic on arrival.   Past Medical History:  Diagnosis Date  . Atherosclerosis of aorta (HCC) 11/25/2016  . Chronic diastolic heart failure (HCC) 11/25/2016  . Diabetes type 2, uncontrolled (HCC) 08/22/2016  . Hypertension   . Hypertensive heart disease 08/20/2016  . Obesity (BMI 30-39.9) 11/25/2016    Past Surgical History:  Procedure Laterality Date  . KIDNEY SURGERY Right    removal of kidney stone  . RIGHT/LEFT HEART CATH AND CORONARY ANGIOGRAPHY N/A 11/26/2016   Procedure: Right/Left Heart Cath and Coronary Angiography;  Surgeon: Marykay Lexavid W Harding, MD;  [facility];  Service: Cardiovascular;  Laterality: N/A;  . TUBAL LIGATION      Family History  Problem Relation Age of Onset  . Cancer Mother   . Heart disease Father   . Diabetes Sister   . Diabetes Brother   . Diabetes Sister   . Diabetes Sister   . Diabetes Sister     Social History  Substance Use Topics  . Smoking status: Current Every Day Smoker    Packs/day: 0.50    Years: 30.00    Types: Cigarettes  . Smokeless tobacco: Never Used  . Alcohol use Yes     Comment: special occasions     Prior to Admission medications   Medication Sig  Start Date End Date Taking? Authorizing Provider  albuterol (PROVENTIL HFA;VENTOLIN HFA) 108 (90 Base) MCG/ACT inhaler Inhale 1-2 puffs into the lungs every 4 (four) hours as needed for wheezing or shortness of breath. 08/12/16  Yes Cheri Fowlerose, Kayla, PA-C  amLODipine (NORVASC) 10 MG tablet Take 10 mg by mouth daily.   Yes [provider]  furosemide (LASIX) 40 MG tablet Take 40 mg by mouth daily.   Yes [provider]  hydrochlorothiazide (HYDRODIURIL) 25 MG tablet Take 1 tablet (25 mg total) by mouth daily. 08/25/16  Yes Arrien, York RamMauricio Daniel, MD  losartan (COZAAR) 100 MG tablet Take 100 mg by mouth daily.   Yes [provider]  metFORMIN (GLUCOPHAGE) 500 MG tablet Take 1 tablet (500 mg total) by mouth 2 (two) times daily with a meal. 08/24/16  Yes Arrien, York RamMauricio Daniel, MD  metoprolol succinate (TOPROL-XL) 100 MG 24 hr tablet Take 100 mg by mouth 2 (two) times daily. Take with or immediately following a meal.   Yes [provider]  Multiple Vitamin (MULTIVITAMIN WITH MINERALS) TABS tablet Take 1 tablet by mouth daily.    Yes [provider]  naproxen sodium (ANAPROX) 220 MG tablet Take 220 mg by mouth 2 (two) times daily as needed (for pain).    Yes [provider]  spironolactone (ALDACTONE) 25 MG tablet Take 25 mg by mouth daily.   Yes [provider]  umeclidinium-vilanterol [facility](ANORO  ELLIPTA) 62.5-25 MCG/INH AEPB Inhale 1 puff into the lungs daily.   Yes [provider]    Allergies Codeine and Morphine and related   REVIEW OF SYSTEMS  Negative except as noted here or in the History of Present Illness.   PHYSICAL EXAMINATION  Initial Vital Signs Blood pressure (!) 195/124, pulse (!) 130, temperature 97.9 F (36.6 C), temperature source Oral, resp. rate (!) 34, height 5' (1.524 m), weight 90.7 kg (200 lb), SpO2 100 %.  Examination General: Well-developed, well-nourished female in no acute distress; appearance  consistent with age of record HENT: normocephalic; atraumatic Eyes: pupils equal, round and reactive to light; extraocular muscles intact Neck: supple Heart: regular rate and rhythm; tachycardia Lungs: clear to auscultation bilaterally; tachypnea Abdomen: soft; nondistended; nontender; bowel sounds present Extremities: No deformity; full range of motion; trace edema of lower legs Neurologic: Awake, alert and oriented; motor function intact in all extremities and symmetric; no facial droop Skin: Warm and dry Psychiatric: Normal mood and affect   RESULTS  Summary of this visit's results, reviewed by myself:   EKG Interpretation  Date/Time:  Wednesday April 02 2017 01:17:06 EDT Ventricular Rate:  120 PR Interval:    QRS Duration: 81 QT Interval:  322 QTC Calculation: 455 R Axis:   57 Text Interpretation:  Sinus tachycardia No significant change was found Confirmed by Iylah Dworkin, Jonny Ruiz ([id number]) on 04/02/2017 1:20:59 AM      Laboratory Studies: Results for orders placed or performed during the hospital encounter of 04/02/17 (from the past 24 hour(s))  CBC with Differential/Platelet     Status: Abnormal   Collection Time: 04/02/17  1:49 AM  Result Value Ref Range   WBC 7.6 4.0 - 10.5 K/uL   RBC 4.49 3.87 - 5.11 MIL/uL   Hemoglobin 12.0 12.0 - 15.0 g/dL   HCT 73.2 20.2 - 54.2 %   MCV 83.1 78.0 - 100.0 fL   MCH 26.7 26.0 - 34.0 pg   MCHC 32.2 30.0 - 36.0 g/dL   RDW 70.6 (H) 23.7 - 62.8 %   Platelets 232 150 - 400 K/uL   Neutrophils Relative % 75 %   Neutro Abs 5.7 1.7 - 7.7 K/uL   Lymphocytes Relative 17 %   Lymphs Abs 1.3 0.7 - 4.0 K/uL   Monocytes Relative 7 %   Monocytes Absolute 0.5 0.1 - 1.0 K/uL   Eosinophils Relative 1 %   Eosinophils Absolute 0.1 0.0 - 0.7 K/uL   Basophils Relative 0 %   Basophils Absolute 0.0 0.0 - 0.1 K/uL  Basic metabolic panel     Status: Abnormal   Collection Time: 04/02/17  1:49 AM  Result Value Ref Range   Sodium 136 135 - 145 mmol/L   Potassium  4.5 3.5 - 5.1 mmol/L   Chloride 102 101 - 111 mmol/L   CO2 25 22 - 32 mmol/L   Glucose, Bld 314 (H) 65 - 99 mg/dL   BUN 13 6 - 20 mg/dL   Creatinine, Ser 3.15 (H) 0.44 - 1.00 mg/dL   Calcium 9.0 8.9 - 17.6 mg/dL   GFR calc non Af Amer 54 (L) >60 mL/min   GFR calc Af Amer >60 >60 mL/min   Anion gap 9 5 - 15  Brain natriuretic peptide     Status: None   Collection Time: 04/02/17  1:50 AM  Result Value Ref Range   B Natriuretic Peptide 12.2 0.0 - 100.0 pg/mL  I-stat troponin, ED     Status: None  Collection Time: 04/02/17  2:01 AM  Result Value Ref Range   Troponin i, poc 0.01 0.00 - 0.08 ng/mL   Comment 3          CBG monitoring, ED     Status: Abnormal   Collection Time: 04/02/17  5:41 AM  Result Value Ref Range   Glucose-Capillary 267 (H) 65 - 99 mg/dL   Imaging Studies: Dg Chest 2 View  Result Date: 04/02/2017 CLINICAL DATA:  Acute onset of shortness of breath. Initial encounter. EXAM: CHEST  2 VIEW COMPARISON:  Chest radiograph performed 11/08/2016 FINDINGS: The lungs are well-aerated and clear. There is no evidence of focal opacification, pleural effusion or pneumothorax. The heart is normal in size; the mediastinal contour is within normal limits. No acute osseous abnormalities are seen. IMPRESSION: No acute cardiopulmonary process seen. Electronically Signed   By: Roanna Raider M.D.   On: 04/02/2017 01:54    ED COURSE  Nursing notes and initial vitals signs, including pulse oximetry, reviewed.  Vitals:   04/02/17 0148 04/02/17 0350 04/02/17 0406 04/02/17 0537  BP: (!) 168/109  (!) 179/113 (!) 153/99  Pulse: (!) 123 (!) 113 (!) 118 (!) 103  Resp: (!) 22  (!) 27 18  Temp: 97.9 F (36.6 C) 97.6 F (36.4 C) 98.2 F (36.8 C)   TempSrc: Oral Oral Oral   SpO2: 100%  100% 100%  Weight:      Height:       5:42 AM BP 153/99, heart rate 97, dyspnea and tachypnea improved after labetalol 20 milligrams IV. Patient has been ambulatory to the bathroom without difficulty. Her  respiratory difficulty, hypertension and tachycardia on arrival are likely due to missing a dose of metoprolol. As noted she has improved with IV labetalol. She was advised to take her medications as prescribed. I do not believe admission is indicated at this time as she has significantly improved.  PROCEDURES    ED DIAGNOSES     ICD-10-CM   1. Shortness of breath R06.02   2. Hyperglycemia R73.9   3. Hypertensive urgency I16.0        Schuyler Behan, Jonny Ruiz, MD 04/02/17 [phone]

## 2017-10-25 ENCOUNTER — Encounter (HOSPITAL_COMMUNITY): Payer: Self-pay | Admitting: Emergency Medicine

## 2017-10-25 ENCOUNTER — Other Ambulatory Visit: Payer: Self-pay

## 2017-10-25 ENCOUNTER — Emergency Department (HOSPITAL_COMMUNITY)
Admission: EM | Admit: 2017-10-25 | Discharge: 2017-10-25 | Disposition: A | Discharge status: Home / Self Care | Payer: BLUE CROSS/BLUE SHIELD | Attending: Emergency Medicine | Admitting: Emergency Medicine

## 2017-10-25 DIAGNOSIS — Z885 Allergy status to narcotic agent status: Secondary | ICD-10-CM | POA: Diagnosis not present

## 2017-10-25 DIAGNOSIS — L02412 Cutaneous abscess of left axilla: Secondary | ICD-10-CM

## 2017-10-25 DIAGNOSIS — Z79899 Other long term (current) drug therapy: Secondary | ICD-10-CM | POA: Insufficient documentation

## 2017-10-25 DIAGNOSIS — I5032 Chronic diastolic (congestive) heart failure: Secondary | ICD-10-CM | POA: Insufficient documentation

## 2017-10-25 DIAGNOSIS — E1165 Type 2 diabetes mellitus with hyperglycemia: Secondary | ICD-10-CM | POA: Insufficient documentation

## 2017-10-25 DIAGNOSIS — F17210 Nicotine dependence, cigarettes, uncomplicated: Secondary | ICD-10-CM | POA: Diagnosis not present

## 2017-10-25 DIAGNOSIS — Z7984 Long term (current) use of oral hypoglycemic drugs: Secondary | ICD-10-CM | POA: Diagnosis not present

## 2017-10-25 DIAGNOSIS — I110 Hypertensive heart disease with heart failure: Secondary | ICD-10-CM | POA: Diagnosis not present

## 2017-10-25 DIAGNOSIS — R739 Hyperglycemia, unspecified: Secondary | ICD-10-CM

## 2017-10-25 DIAGNOSIS — R2232 Localized swelling, mass and lump, left upper limb: Secondary | ICD-10-CM | POA: Diagnosis present

## 2017-10-25 LAB — BASIC METABOLIC PANEL
ANION GAP: 12 (ref 5–15)
BUN: 11 mg/dL (ref 6–20)
CALCIUM: 8.7 mg/dL — AB (ref 8.9–10.3)
CO2: 21 mmol/L — ABNORMAL LOW (ref 22–32)
CREATININE: 0.91 mg/dL (ref 0.44–1.00)
Chloride: 102 mmol/L (ref 101–111)
GFR calc Af Amer: >60 mL/min (ref >60)
GLUCOSE: 303 mg/dL — AB (ref 65–99)
Potassium: 3.6 mmol/L (ref 3.5–5.1)
Sodium: 135 mmol/L (ref 135–145)

## 2017-10-25 LAB — CBC WITH DIFFERENTIAL/PLATELET
BASOS ABS: 0 10*3/uL (ref 0.0–0.1)
Basophils Relative: 0 %
EOS PCT: 1 %
Eosinophils Absolute: 0.1 10*3/uL (ref 0.0–0.7)
HCT: 40.5 % (ref 36.0–46.0)
Hemoglobin: 13 g/dL (ref 12.0–15.0)
LYMPHS PCT: 20 %
Lymphs Abs: 1.6 10*3/uL (ref 0.7–4.0)
MCH: 27.8 pg (ref 26.0–34.0)
MCHC: 32.1 g/dL (ref 30.0–36.0)
MCV: 86.5 fL (ref 78.0–100.0)
MONO ABS: 0.6 10*3/uL (ref 0.1–1.0)
MONOS PCT: 7 %
Neutro Abs: 5.7 10*3/uL (ref 1.7–7.7)
Neutrophils Relative %: 72 %
PLATELETS: 178 10*3/uL (ref 150–400)
RBC: 4.68 MIL/uL (ref 3.87–5.11)
RDW: 15.3 % (ref 11.5–15.5)
WBC: 8 10*3/uL (ref 4.0–10.5)

## 2017-10-25 LAB — I-STAT CHEM 8, ED
BUN: 11 mg/dL (ref 6–20)
CALCIUM ION: 1.14 mmol/L — AB (ref 1.15–1.40)
Chloride: 102 mmol/L (ref 101–111)
Creatinine, Ser: 0.7 mg/dL (ref 0.44–1.00)
GLUCOSE: 311 mg/dL — AB (ref 65–99)
HCT: 43 % (ref 36.0–46.0)
HEMOGLOBIN: 14.6 g/dL (ref 12.0–15.0)
POTASSIUM: 3.6 mmol/L (ref 3.5–5.1)
Sodium: 139 mmol/L (ref 135–145)
TCO2: 24 mmol/L (ref 22–32)

## 2017-10-25 MED ORDER — METOPROLOL TARTRATE 25 MG PO TABS
100.0000 mg | ORAL_TABLET | Freq: Once | ORAL | Status: AC
  Administered 2017-10-25: 100 mg via ORAL
  Filled 2017-10-25: qty 4

## 2017-10-25 MED ORDER — SULFAMETHOXAZOLE-TRIMETHOPRIM 800-160 MG PO TABS: 2.0000 | ORAL_TABLET | Freq: Two times a day (BID) | ORAL | 0 refills | Status: DC

## 2017-10-25 MED ORDER — HYDROCODONE-ACETAMINOPHEN 5-325 MG PO TABS: ORAL_TABLET | ORAL | 0 refills | Status: DC

## 2017-10-25 MED ORDER — LIDOCAINE HCL (PF) 1 % IJ SOLN
5.0000 mL | Freq: Once | INTRAMUSCULAR | Status: AC
  Administered 2017-10-25: 5 mL via INTRADERMAL
  Filled 2017-10-25: qty 5

## 2017-10-25 MED ORDER — SODIUM CHLORIDE 0.9 % IV BOLUS (SEPSIS)
1000.0000 mL | Freq: Once | INTRAVENOUS | Status: AC
  Administered 2017-10-25: 1000 mL via INTRAVENOUS

## 2017-10-25 MED ORDER — OXYCODONE-ACETAMINOPHEN 5-325 MG PO TABS
1.0000 | ORAL_TABLET | Freq: Once | ORAL | Status: AC
  Administered 2017-10-25: 1 via ORAL
  Filled 2017-10-25: qty 1

## 2017-10-25 MED ORDER — SULFAMETHOXAZOLE-TRIMETHOPRIM 800-160 MG PO TABS
1.0000 | ORAL_TABLET | Freq: Once | ORAL | Status: AC
  Administered 2017-10-25: 1 via ORAL
  Filled 2017-10-25: qty 1

## 2017-10-25 NOTE — ED Provider Notes (Signed)
[facility]   CSN: [id number] Arrival date & time: 10/25/17  [id number]     History   Chief Complaint Chief Complaint  Patient presents with  . Abscess    HPI   Blood pressure (!) 153/100, pulse 77, temperature 98.3 F (36.8 C), temperature source Oral, resp. rate 18, height 5' (1.524 m), weight 93 kg (205 lb), SpO2 97 %.  Debbie Davis is a 57 y.o. female complaining of painful swelling to left axilla onset 2 days ago, no prior history of hidradenitis or abscesses.  Patient is non-insulin-dependent diabetic, she is been taking her metformin as directed, sugars have been in the 300s over the last 2 days, pain is severe and exacerbated by palpation and movement.  She denies fever, chills, nausea, vomiting.  She states that she normally takes metoprolol but did not have her daily dose because she came to the emergency department.  Multiple over-the-counter pain medications taken at home with little relief.   Past Medical History:  Diagnosis Date  . Atherosclerosis of aorta (HCC) 11/25/2016  . Chronic diastolic heart failure (HCC) 11/25/2016  . Diabetes type 2, uncontrolled (HCC) 08/22/2016  . Hypertension   . Hypertensive heart disease 08/20/2016  . Obesity (BMI 30-39.9) 11/25/2016    Patient Active Problem List   Diagnosis Date Noted  . Progressive angina (HCC) 11/25/2016  . Atherosclerosis of aorta (HCC) 11/25/2016  . Chronic diastolic heart failure (HCC) 11/25/2016  . Obesity (BMI 30-39.9) 11/25/2016  . Dyspnea 11/25/2016  . Diabetes type 2, uncontrolled (HCC) 08/22/2016  . Hypertensive heart disease 08/20/2016    Past Surgical History:  Procedure Laterality Date  . KIDNEY SURGERY Right    removal of kidney stone  . RIGHT/LEFT HEART CATH AND CORONARY ANGIOGRAPHY N/A 11/26/2016   Procedure: Right/Left Heart Cath and Coronary Angiography;  Surgeon: Marykay Lexavid W Harding, MD;  [facility];  Service: Cardiovascular;   Laterality: N/A;  . TUBAL LIGATION      OB History    No data available       Home Medications    Prior to Admission medications   Medication Sig Start Date End Date Taking? Authorizing Provider  albuterol (PROVENTIL HFA;VENTOLIN HFA) 108 (90 Base) MCG/ACT inhaler Inhale 1-2 puffs into the lungs every 4 (four) hours as needed for wheezing or shortness of breath. 08/12/16   Cheri Fowlerose, Kayla, PA-C  amLODipine (NORVASC) 10 MG tablet Take 10 mg by mouth daily.    [provider]  furosemide (LASIX) 40 MG tablet Take 40 mg by mouth daily.    [provider]  hydrochlorothiazide (HYDRODIURIL) 25 MG tablet Take 1 tablet (25 mg total) by mouth daily. 08/25/16   Arrien, York RamMauricio Daniel, MD  HYDROcodone-acetaminophen (NORCO/VICODIN) 5-325 MG tablet Take 1-2 tablets by mouth every 6 hours as needed for pain and/or cough. 10/25/17   Claudell Wohler, Joni ReiningNicole, PA-C  losartan (COZAAR) 100 MG tablet Take 100 mg by mouth daily.    [provider]  metFORMIN (GLUCOPHAGE) 500 MG tablet Take 1 tablet (500 mg total) by mouth 2 (two) times daily with a meal. 08/24/16   Arrien, York RamMauricio Daniel, MD  metoprolol succinate (TOPROL-XL) 100 MG 24 hr tablet Take 100 mg by mouth 2 (two) times daily. Take with or immediately following a meal.    [provider]  Multiple Vitamin (MULTIVITAMIN WITH MINERALS) TABS tablet Take 1 tablet by mouth daily.     [provider]  naproxen sodium (ANAPROX) 220  MG tablet Take 220 mg by mouth 2 (two) times daily as needed (for pain).     [provider]  spironolactone (ALDACTONE) 25 MG tablet Take 25 mg by mouth daily.    [provider]  sulfamethoxazole-trimethoprim (BACTRIM DS) 800-160 MG tablet Take 2 tablets by mouth 2 (two) times daily. 10/25/17   [name], Joni Reining, PA-C  umeclidinium-vilanterol (ANORO ELLIPTA) 62.5-25 MCG/INH AEPB Inhale 1 puff into the lungs daily.    [provider]    Family History Family  History  Problem Relation Age of Onset  . Cancer Mother   . Heart disease Father   . Diabetes Sister   . Diabetes Brother   . Diabetes Sister   . Diabetes Sister   . Diabetes Sister     Social History Social History   Tobacco Use  . Smoking status: Current Every Day Smoker    Packs/day: 0.50    Years: 30.00    Pack years: 15.00    Types: Cigarettes  . Smokeless tobacco: Never Used  Substance Use Topics  . Alcohol use: Yes    Comment: special occasions   . Drug use: No     Allergies   Codeine and Morphine and related   Review of Systems Review of Systems  A complete review of systems was obtained and all systems are negative except as noted in the HPI and PMH.   Physical Exam Updated Vital Signs BP (!) 153/100 (BP Location: Right Arm)   Pulse 77   Temp 98.3 F (36.8 C) (Oral)   Resp 18   Ht 5' (1.524 m)   Wt 93 kg (205 lb)   SpO2 97%   BMI 40.04 kg/m   Physical Exam  Constitutional: She is oriented to person, place, and time. She appears well-developed and well-nourished. No distress.  HENT:  Head: Normocephalic and atraumatic.  Mouth/Throat: Oropharynx is clear and moist.  Eyes: Conjunctivae and EOM are normal. Pupils are equal, round, and reactive to light.  Neck: Normal range of motion.  Cardiovascular: Normal rate, regular rhythm and intact distal pulses.  Pulmonary/Chest: Effort normal and breath sounds normal.  Abdominal: Soft. There is no tenderness.  Musculoskeletal: Normal range of motion.  Neurological: She is alert and oriented to person, place, and time.  Skin: She is not diaphoretic.  Left axilla with one indurated abscess and one pointing abscess, no surrounding cellulitis.  Exquisitely tender to palpation.  Psychiatric: She has a normal mood and affect.  Nursing note and vitals reviewed.    ED Treatments / Results  Labs (all labs ordered are listed, but only abnormal results are displayed) Labs Reviewed  BASIC METABOLIC PANEL -  Abnormal; Notable for the following components:      Result Value   CO2 21 (*)    Glucose, Bld 303 (*)    Calcium 8.7 (*)    All other components within normal limits  I-STAT CHEM 8, ED - Abnormal; Notable for the following components:   Glucose, Bld 311 (*)    Calcium, Ion 1.14 (*)    All other components within normal limits  CBC WITH DIFFERENTIAL/PLATELET    EKG  EKG Interpretation None       Radiology No results found.  Procedures .Marland KitchenIncision and Drainage Date/Time: 10/25/2017 10:11 AM Performed by: Wynetta Emery, PA-C Authorized by: Wynetta Emery, PA-C   Consent:    Consent obtained:  Verbal   Consent given by:  Patient   Risks discussed:  Incomplete drainage  Alternatives discussed:  No treatment and delayed treatment Location:    Type:  Abscess   Location:  Trunk Pre-procedure details:    Skin preparation:  Chloraprep Anesthesia (see MAR for exact dosages):    Anesthesia method:  Local infiltration   Local anesthetic:  Lidocaine 1% w/o epi Procedure type:    Complexity:  Complex Procedure details:    Incision types:  Single straight   Scalpel blade:  11   Wound management:  Probed and deloculated and irrigated with saline   Drainage:  Purulent   Drainage amount:  Copious   Wound treatment:  Wound left open   Packing materials:  None Post-procedure details:    Patient tolerance of procedure:  Tolerated with difficulty    Procedures .Marland KitchenIncision and Drainage Date/Time: 10/25/2017 10:11 AM Performed by: Wynetta Emery, PA-C Authorized by: Wynetta Emery, PA-C   Consent:    Consent obtained:  Verbal   Consent given by:  Patient   Risks discussed:  Incomplete drainage   Alternatives discussed:  No treatment and delayed treatment Location:    Type:  Abscess   Location:  Trunk Pre-procedure details:    Skin preparation:  Chloraprep Anesthesia (see MAR for exact dosages):    Anesthesia method:  Local infiltration   Local anesthetic:   Lidocaine 1% w/o epi Procedure type:    Complexity:  Complex Procedure details:    Incision types:  Single straight   Scalpel blade:  11   Wound management:  Probed and deloculated and irrigated with saline   Drainage:  Purulent   Drainage amount:  Copious   Wound treatment:  Wound left open   Packing materials:  None Post-procedure details:    Patient tolerance of procedure:  Tolerated with difficulty  Medications Ordered in ED Medications  sulfamethoxazole-trimethoprim (BACTRIM DS,SEPTRA DS) 800-160 MG per tablet 1 tablet (not administered)  oxyCODONE-acetaminophen (PERCOCET/ROXICET) 5-325 MG per tablet 1 tablet (1 tablet Oral Given 10/25/17 0834)  sodium chloride 0.9 % bolus 1,000 mL (0 mLs Intravenous Stopped 10/25/17 0931)  lidocaine (PF) (XYLOCAINE) 1 % injection 5 mL (5 mLs Intradermal Given 10/25/17 0834)  metoprolol tartrate (LOPRESSOR) tablet 100 mg (100 mg Oral Given 10/25/17 0834)     Initial Impression / Assessment and Plan / ED Course  I have reviewed the triage vital signs and the nursing notes.  Pertinent labs & imaging results that were available during my care of the patient were reviewed by me and considered in my medical decision making (see chart for details).     Vitals:   10/25/17 0834 10/25/17 0900 10/25/17 0930 10/25/17 0932  BP:  (!) 168/93 (!) 153/100 (!) 153/100  Pulse: (!) 108 (!) 110 77 77  Resp:    18  Temp:      TempSrc:      SpO2:  98% 97% 97%  Weight:      Height:        Medications  sulfamethoxazole-trimethoprim (BACTRIM DS,SEPTRA DS) 800-160 MG per tablet 1 tablet (not administered)  oxyCODONE-acetaminophen (PERCOCET/ROXICET) 5-325 MG per tablet 1 tablet (1 tablet Oral Given 10/25/17 0834)  sodium chloride 0.9 % bolus 1,000 mL (0 mLs Intravenous Stopped 10/25/17 0931)  lidocaine (PF) (XYLOCAINE) 1 % injection 5 mL (5 mLs Intradermal Given 10/25/17 0834)  metoprolol tartrate (LOPRESSOR) tablet 100 mg (100 mg Oral Given 10/25/17 0834)     Debbie Davis is 57 y.o. female presenting with abscess to left axilla onset several days ago, patient is tachycardic, probably multifactorial she  did not take her metoprolol she is also in severe pain.  Will check basic blood work, hydrate and give her her regular dose of metoprolol.  I&D performed with expression of a large amount of purulent drainage, I will also start her on Bactrim.  We have had a discussion of how to care for the incisions at home.  Blood work reassuring, hyperglycemia with no ketosis, tachycardia has resolved after hydration, beta-blocker.  Evaluation does not show pathology that would require ongoing emergent intervention or inpatient treatment. Pt is hemodynamically stable and mentating appropriately. Discussed findings and plan with patient/guardian, who agrees with care plan. All questions answered. Return precautions discussed and outpatient follow up given.      Final Clinical Impressions(s) / ED Diagnoses   Final diagnoses:  Abscess of left axilla  Hyperglycemia without ketosis    ED Discharge Orders        Ordered    sulfamethoxazole-trimethoprim (BACTRIM DS) 800-160 MG tablet  2 times daily     10/25/17 1006    HYDROcodone-acetaminophen (NORCO/VICODIN) 5-325 MG tablet     10/25/17 1006       [place] Fassnacht, Mardella Layman 10/25/17 1013    Benjiman Core, MD 10/25/17 1533

## 2017-10-25 NOTE — ED Triage Notes (Signed)
Pt. Stated, I have a abscess under left arm for 2 days.

## 2017-10-25 NOTE — ED Notes (Signed)
Patient reports that she has had an abscess to the left underarm for the past few days. She has tried to use warm washcloths with no relief. Pt states that she did not have any of her regular medications this morning pta.

## 2017-10-25 NOTE — Discharge Instructions (Signed)
Take vicodin for breakthrough pain, do not drink alcohol, drive, care for children or do other critical tasks while taking vicodin. ° °Please follow with your primary care doctor in the next 2 days for a check-up. They must obtain records for further management.  ° °Do not hesitate to return to the Emergency Department for any new, worsening or concerning symptoms.  ° °

## 2018-03-20 ENCOUNTER — Other Ambulatory Visit: Payer: Self-pay

## 2018-03-20 ENCOUNTER — Emergency Department (HOSPITAL_COMMUNITY)
Admission: EM | Admit: 2018-03-20 | Discharge: 2018-03-20 | Disposition: A | Discharge status: Home / Self Care | Payer: BLUE CROSS/BLUE SHIELD | Attending: Emergency Medicine | Admitting: Emergency Medicine

## 2018-03-20 ENCOUNTER — Emergency Department (HOSPITAL_COMMUNITY): Payer: BLUE CROSS/BLUE SHIELD

## 2018-03-20 ENCOUNTER — Encounter (HOSPITAL_COMMUNITY): Payer: Self-pay | Admitting: *Deleted

## 2018-03-20 DIAGNOSIS — F17210 Nicotine dependence, cigarettes, uncomplicated: Secondary | ICD-10-CM | POA: Insufficient documentation

## 2018-03-20 DIAGNOSIS — R51 Headache: Secondary | ICD-10-CM | POA: Diagnosis present

## 2018-03-20 DIAGNOSIS — E119 Type 2 diabetes mellitus without complications: Secondary | ICD-10-CM | POA: Diagnosis not present

## 2018-03-20 DIAGNOSIS — Z7984 Long term (current) use of oral hypoglycemic drugs: Secondary | ICD-10-CM | POA: Diagnosis not present

## 2018-03-20 DIAGNOSIS — Z79899 Other long term (current) drug therapy: Secondary | ICD-10-CM | POA: Insufficient documentation

## 2018-03-20 DIAGNOSIS — I10 Essential (primary) hypertension: Secondary | ICD-10-CM | POA: Insufficient documentation

## 2018-03-20 DIAGNOSIS — R519 Headache, unspecified: Secondary | ICD-10-CM

## 2018-03-20 LAB — CBC
HCT: 45.5 % (ref 36.0–46.0)
HEMOGLOBIN: 14.1 g/dL (ref 12.0–15.0)
MCH: 27.9 pg (ref 26.0–34.0)
MCHC: 31 g/dL (ref 30.0–36.0)
MCV: 89.9 fL (ref 78.0–100.0)
Platelets: 176 10*3/uL (ref 150–400)
RBC: 5.06 MIL/uL (ref 3.87–5.11)
RDW: 14.3 % (ref 11.5–15.5)
WBC: 7.6 10*3/uL (ref 4.0–10.5)

## 2018-03-20 LAB — BASIC METABOLIC PANEL
ANION GAP: 11 (ref 5–15)
BUN: 15 mg/dL (ref 6–20)
CO2: 24 mmol/L (ref 22–32)
Calcium: 9.7 mg/dL (ref 8.9–10.3)
Chloride: 102 mmol/L (ref 101–111)
Creatinine, Ser: 0.97 mg/dL (ref 0.44–1.00)
GLUCOSE: 281 mg/dL — AB (ref 65–99)
Potassium: 3.4 mmol/L — ABNORMAL LOW (ref 3.5–5.1)
Sodium: 137 mmol/L (ref 135–145)

## 2018-03-20 LAB — TROPONIN I

## 2018-03-20 MED ORDER — FENTANYL CITRATE (PF) 100 MCG/2ML IJ SOLN
50.0000 ug | Freq: Once | INTRAMUSCULAR | Status: AC
  Administered 2018-03-20: 50 ug via INTRAVENOUS
  Filled 2018-03-20: qty 2

## 2018-03-20 MED ORDER — METHOCARBAMOL 500 MG PO TABS: 500.0000 mg | ORAL_TABLET | Freq: Two times a day (BID) | ORAL | 0 refills | Status: DC

## 2018-03-20 MED ORDER — HYDRALAZINE HCL 20 MG/ML IJ SOLN
10.0000 mg | Freq: Once | INTRAMUSCULAR | Status: AC
  Administered 2018-03-20: 10 mg via INTRAVENOUS
  Filled 2018-03-20: qty 1

## 2018-03-20 MED ORDER — METHOCARBAMOL 500 MG PO TABS
1000.0000 mg | ORAL_TABLET | Freq: Once | ORAL | Status: AC
  Administered 2018-03-20: 1000 mg via ORAL
  Filled 2018-03-20: qty 2

## 2018-03-20 MED ORDER — ONDANSETRON HCL 4 MG/2ML IJ SOLN
4.0000 mg | Freq: Once | INTRAMUSCULAR | Status: AC
  Administered 2018-03-20: 4 mg via INTRAVENOUS
  Filled 2018-03-20: qty 2

## 2018-03-20 NOTE — ED Triage Notes (Signed)
Headache for 12 hours with an elevated bp  She took [employer]

## 2018-03-20 NOTE — ED Provider Notes (Signed)
[facility]   CSN: [id number] Arrival date & time: 03/20/18  1521     History   Chief Complaint Chief Complaint  Patient presents with  . Hypertension    HPI Debbie Davis is a 57 y.o. female.  Debbie Davis is a 57 y.o. Female with a hx of diabetes, HTN and obesity, who presents to the ED for evaluation of headache and elevated blood pressure.  Patient reports severe headache that started last night, and she checked her blood pressure at home and noted it was very elevated with top number in the 200s.  Patient localizes headache across the front of her head and around the back and reports it feels like a tight band.  She reports history of migraines previously but this feels different.  No associated fevers, neck pain or neck stiffness.  She does report she is intermittently gotten headaches when her blood pressure has been elevated in the past.  She reports headache has come on gradually and was not maximum in intensity at onset this is not the most severe headache of her life.  She denies any associated vision changes, dizziness, nausea or vomiting, numbness weakness or tingling in any extremities.  No associated chest pain, shortness of breath.  No abdominal pain.  Patient reports long history of hypertension for which she takes amlodipine, HCTZ and metoprolol, she reports she takes all 3 of these medications daily in the morning and has not missed any doses recently, she does not have any blood pressure medication she takes at night.     Past Medical History:  Diagnosis Date  . Atherosclerosis of aorta (HCC) 11/25/2016  . Chronic diastolic heart failure (HCC) 11/25/2016  . Diabetes type 2, uncontrolled (HCC) 08/22/2016  . Hypertension   . Hypertensive heart disease 08/20/2016  . Obesity (BMI 30-39.9) 11/25/2016    Patient Active Problem List   Diagnosis Date Noted  . Progressive angina (HCC) 11/25/2016  . Atherosclerosis of  aorta (HCC) 11/25/2016  . Chronic diastolic heart failure (HCC) 11/25/2016  . Obesity (BMI 30-39.9) 11/25/2016  . Dyspnea 11/25/2016  . Diabetes type 2, uncontrolled (HCC) 08/22/2016  . Hypertensive heart disease 08/20/2016    Past Surgical History:  Procedure Laterality Date  . KIDNEY SURGERY Right    removal of kidney stone  . RIGHT/LEFT HEART CATH AND CORONARY ANGIOGRAPHY N/A 11/26/2016   Procedure: Right/Left Heart Cath and Coronary Angiography;  Surgeon: Marykay Lexavid W Harding, MD;  Location: [facility] INVASIVE CV LAB;  Service: Cardiovascular;  Laterality: N/A;  . TUBAL LIGATION       OB History   None      Home Medications    Prior to Admission medications   Medication Sig Start Date End Date Taking? Authorizing Provider  albuterol (PROVENTIL HFA;VENTOLIN HFA) 108 (90 Base) MCG/ACT inhaler Inhale 1-2 puffs into the lungs every 4 (four) hours as needed for wheezing or shortness of breath. 08/12/16  Yes Cheri Fowlerose, Kayla, PA-C  amLODipine (NORVASC) 10 MG tablet Take 10 mg by mouth daily.   Yes [provider]  furosemide (LASIX) 40 MG tablet Take 40 mg by mouth daily.   Yes [provider]  hydrochlorothiazide (HYDRODIURIL) 25 MG tablet Take 1 tablet (25 mg total) by mouth daily. 08/25/16  Yes Arrien, York RamMauricio Daniel, MD  HYDROcodone-acetaminophen (NORCO/VICODIN) 5-325 MG tablet Take 1-2 tablets by mouth every 6 hours as needed for pain and/or cough. 10/25/17  Yes Pisciotta, Joni ReiningNicole, PA-C  metFORMIN (GLUCOPHAGE) 500  MG tablet Take 1 tablet (500 mg total) by mouth 2 (two) times daily with a meal. 08/24/16  Yes Arrien, York Ram, MD  metoprolol succinate (TOPROL-XL) 100 MG 24 hr tablet Take 100 mg by mouth 2 (two) times daily. Take with or immediately following a meal.   Yes [provider]  Multiple Vitamin (MULTIVITAMIN WITH MINERALS) TABS tablet Take 1 tablet by mouth daily.    Yes [provider]  naproxen sodium (ANAPROX) 220 MG tablet Take 220 mg by  mouth 2 (two) times daily as needed (for pain).    Yes [provider]  spironolactone (ALDACTONE) 25 MG tablet Take 25 mg by mouth daily.   Yes [provider]  umeclidinium-vilanterol (ANORO ELLIPTA) 62.5-25 MCG/INH AEPB Inhale 1 puff into the lungs daily.   Yes [provider]  methocarbamol (ROBAXIN) 500 MG tablet Take 1 tablet (500 mg total) by mouth 2 (two) times daily. 03/20/18   Dartha Lodge, PA-C  sulfamethoxazole-trimethoprim (BACTRIM DS) 800-160 MG tablet Take 2 tablets by mouth 2 (two) times daily. Patient not taking: Reported on 03/20/2018 10/25/17   Pisciotta, Joni Reining, PA-C    Family History Family History  Problem Relation Age of Onset  . Cancer Mother   . Heart disease Father   . Diabetes Sister   . Diabetes Brother   . Diabetes Sister   . Diabetes Sister   . Diabetes Sister     Social History Social History   Tobacco Use  . Smoking status: Current Every Day Smoker    Packs/day: 0.50    Years: 30.00    Pack years: 15.00    Types: Cigarettes  . Smokeless tobacco: Never Used  Substance Use Topics  . Alcohol use: Yes    Comment: special occasions   . Drug use: No     Allergies   Codeine and Morphine and related   Review of Systems Review of Systems  Constitutional: Negative for chills and fever.  HENT: Negative.   Eyes: Negative for photophobia, pain, redness and visual disturbance.  Respiratory: Negative for shortness of breath.   Cardiovascular: Negative for chest pain.  Gastrointestinal: Negative for abdominal pain, nausea and vomiting.  Genitourinary: Negative for dysuria and frequency.  Musculoskeletal: Negative for neck pain and neck stiffness.  Skin: Negative for color change and rash.  Neurological: Positive for headaches. Negative for dizziness, tremors, seizures, syncope, facial asymmetry, speech difficulty, weakness, light-headedness and numbness.     Physical Exam Updated Vital Signs BP (!) 172/112   Pulse 88    Temp 98.3 F (36.8 C) (Oral)   Resp 15   Ht 5' (1.524 m)   Wt 89.8 kg (198 lb)   SpO2 100%   BMI 38.67 kg/m   Physical Exam  Constitutional: She is oriented to person, place, and time. She appears well-developed and well-nourished. No distress.  HENT:  Head: Normocephalic and atraumatic.  Mouth/Throat: Oropharynx is clear and moist.  Eyes: Pupils are equal, round, and reactive to light. EOM are normal. Right eye exhibits no discharge. Left eye exhibits no discharge.  No nystagmus  Neck: Normal range of motion. Neck supple.  No rigidity  Cardiovascular: Normal rate, regular rhythm, normal heart sounds and intact distal pulses. Exam reveals no gallop and no friction rub.  No murmur heard. Pulmonary/Chest: Effort normal and breath sounds normal. No stridor. No respiratory distress. She has no wheezes. She has no rales.  Respirations equal and unlabored, patient able to speak in full sentences, lungs clear  to auscultation bilaterally  Abdominal: Soft. Bowel sounds are normal. She exhibits no distension and no mass. There is no tenderness. There is no guarding.  Abdomen soft, nondistended, nontender to palpation in all quadrants without guarding or peritoneal signs  Musculoskeletal: She exhibits no edema or deformity.  Lymphadenopathy:    She has no cervical adenopathy.  Neurological: She is alert and oriented to person, place, and time. Coordination normal.  Speech is clear, able to follow commands CN III-XII intact Normal strength in upper and lower extremities bilaterally including dorsiflexion and plantar flexion, strong and equal grip strength Sensation normal to light and sharp touch Moves extremities without ataxia, coordination intact Normal finger to nose and rapid alternating movements No pronator drift  Skin: Skin is warm and dry. Capillary refill takes less than 2 seconds. She is not diaphoretic.  Psychiatric: She has a normal mood and affect. Her behavior is normal.    Nursing note and vitals reviewed.    ED Treatments / Results  Labs (all labs ordered are listed, but only abnormal results are displayed) Labs Reviewed  BASIC METABOLIC PANEL - Abnormal; Notable for the following components:      Result Value   Potassium 3.4 (*)    Glucose, Bld 281 (*)    All other components within normal limits  CBC  TROPONIN I    EKG EKG Interpretation  Date/Time:  Friday March 20 2018 16:04:22 EDT Ventricular Rate:  113 PR Interval:  130 QRS Duration: 84 QT Interval:  334 QTC Calculation: 458 R Axis:   30 Text Interpretation:  Sinus tachycardia Septal infarct , age undetermined Abnormal ECG No significant change since last tracing Confirmed by Rolland Porter ([id number]) on 03/20/2018 4:13:09 PM   Radiology Dg Chest 2 View  Result Date: 03/20/2018 CLINICAL DATA:  Shortness of breath and hypertension EXAM: CHEST - 2 VIEW COMPARISON:  April 02, 2017 FINDINGS: There is no edema or consolidation. The heart size and pulmonary vascularity are normal. No adenopathy. There is aortic atherosclerosis. No bone lesions. IMPRESSION: Aortic atherosclerosis.  No edema or consolidation. Aortic Atherosclerosis (ICD10-I70.0). Electronically Signed   By: Bretta Bang III M.D.   On: 03/20/2018 16:56    Procedures Procedures (including critical care time)  Medications Ordered in ED Medications  ondansetron (ZOFRAN) injection 4 mg (4 mg Intravenous Given 03/20/18 2147)  methocarbamol (ROBAXIN) tablet 1,000 mg (1,000 mg Oral Given 03/20/18 2147)  hydrALAZINE (APRESOLINE) injection 10 mg (10 mg Intravenous Given 03/20/18 1000)  fentaNYL (SUBLIMAZE) injection 50 mcg (50 mcg Intravenous Given 03/20/18 2147)     Initial Impression / Assessment and Plan / ED Course  I have reviewed the triage vital signs and the nursing notes.  Pertinent labs & imaging results that were available during my care of the patient were reviewed by me and considered in my medical decision making (see  chart for details).  Patient presents the emergency department for evaluation of headache and elevated blood pressure.  Has history of getting headaches when her blood pressure gets out of control in the past, has not missed any doses of her BP meds.  Headache story is most consistent with tension headache.  Not concerning for subarachnoid or intracranial hemorrhage given story and normal neurologic exam.  No associated fevers or neck stiffness, no meningeal signs to suggest meningitis.  Patient is not experiencing any chest pain or shortness of breath.  Difficult to say whether headache caused elevated blood pressure or vice versa.  Will treat with morphine, Zofran  and Robaxin to help relieve tension headache, and 10 mg of IV hydralazine for blood pressure.  On reevaluation patient is normotensive, and reports complete resolution of headache.  At this time I feel she is stable for discharge home.  Encourage patient to check and record her blood pressure daily and keep a log, she is to follow-up with her primary care doctor within 1 week for a blood pressure check.  Return precautions discussed.  Patient expresses understanding and is in agreement with plan.  Case discussed with Dr. Fayrene Fearing who guided patient's care and is in agreement with plan.  Vitals:   03/20/18 2000 03/20/18 2015 03/20/18 2145 03/20/18 2230  BP: (!) 185/113 (!) 172/112 (!) 172/109 133/83  Pulse: 91 88 96 100  Resp: 20 15 15 15   Temp:      TempSrc:      SpO2: 100% 100% 100% 99%  Weight:      Height:         Final Clinical Impressions(s) / ED Diagnoses   Final diagnoses:  Hypertension, unspecified type  Bad headache    ED Discharge Orders        Ordered    methocarbamol (ROBAXIN) 500 MG tablet  2 times daily     03/20/18 2250       Legrand Rams 03/21/18 Gerhard Perches, MD 03/26/18 2243

## 2018-03-20 NOTE — Discharge Instructions (Signed)
Your labs and work-up is very reassuring today.  It is tough to tell whether your high blood pressure because the headache or the headache because the elevated blood pressure.  Continue taking her blood pressure medications daily as directed, please check and record your blood pressure daily and follow-up with your primary care doctor within 1 week for a recheck.  May use Robaxin and Tylenol as needed for headaches, please discuss these with your primary care doctor as well.  Return to the emergency department if you develop chest pain, shortness of breath or any of the below scenarios develop.  Get help right away if: Your headache becomes really bad. You keep throwing up. You have a stiff neck. You have trouble seeing. You have trouble speaking. You have pain in the eye or ear. Your muscles are weak or you lose muscle control. You lose your balance or have trouble walking. You feel like you will pass out (faint) or you pass out. You have confusion.

## 2018-07-23 IMAGING — CR DG CHEST 2V
2 series · 2 of 2 positions shown · non-contrast
Comparison: Chest radiograph performed 09/28/2013

CLINICAL DATA: Acute onset of shortness of breath and productive
cough. Fever and headache. Initial encounter.

EXAM:
CHEST  2 VIEW

[w chest lat]
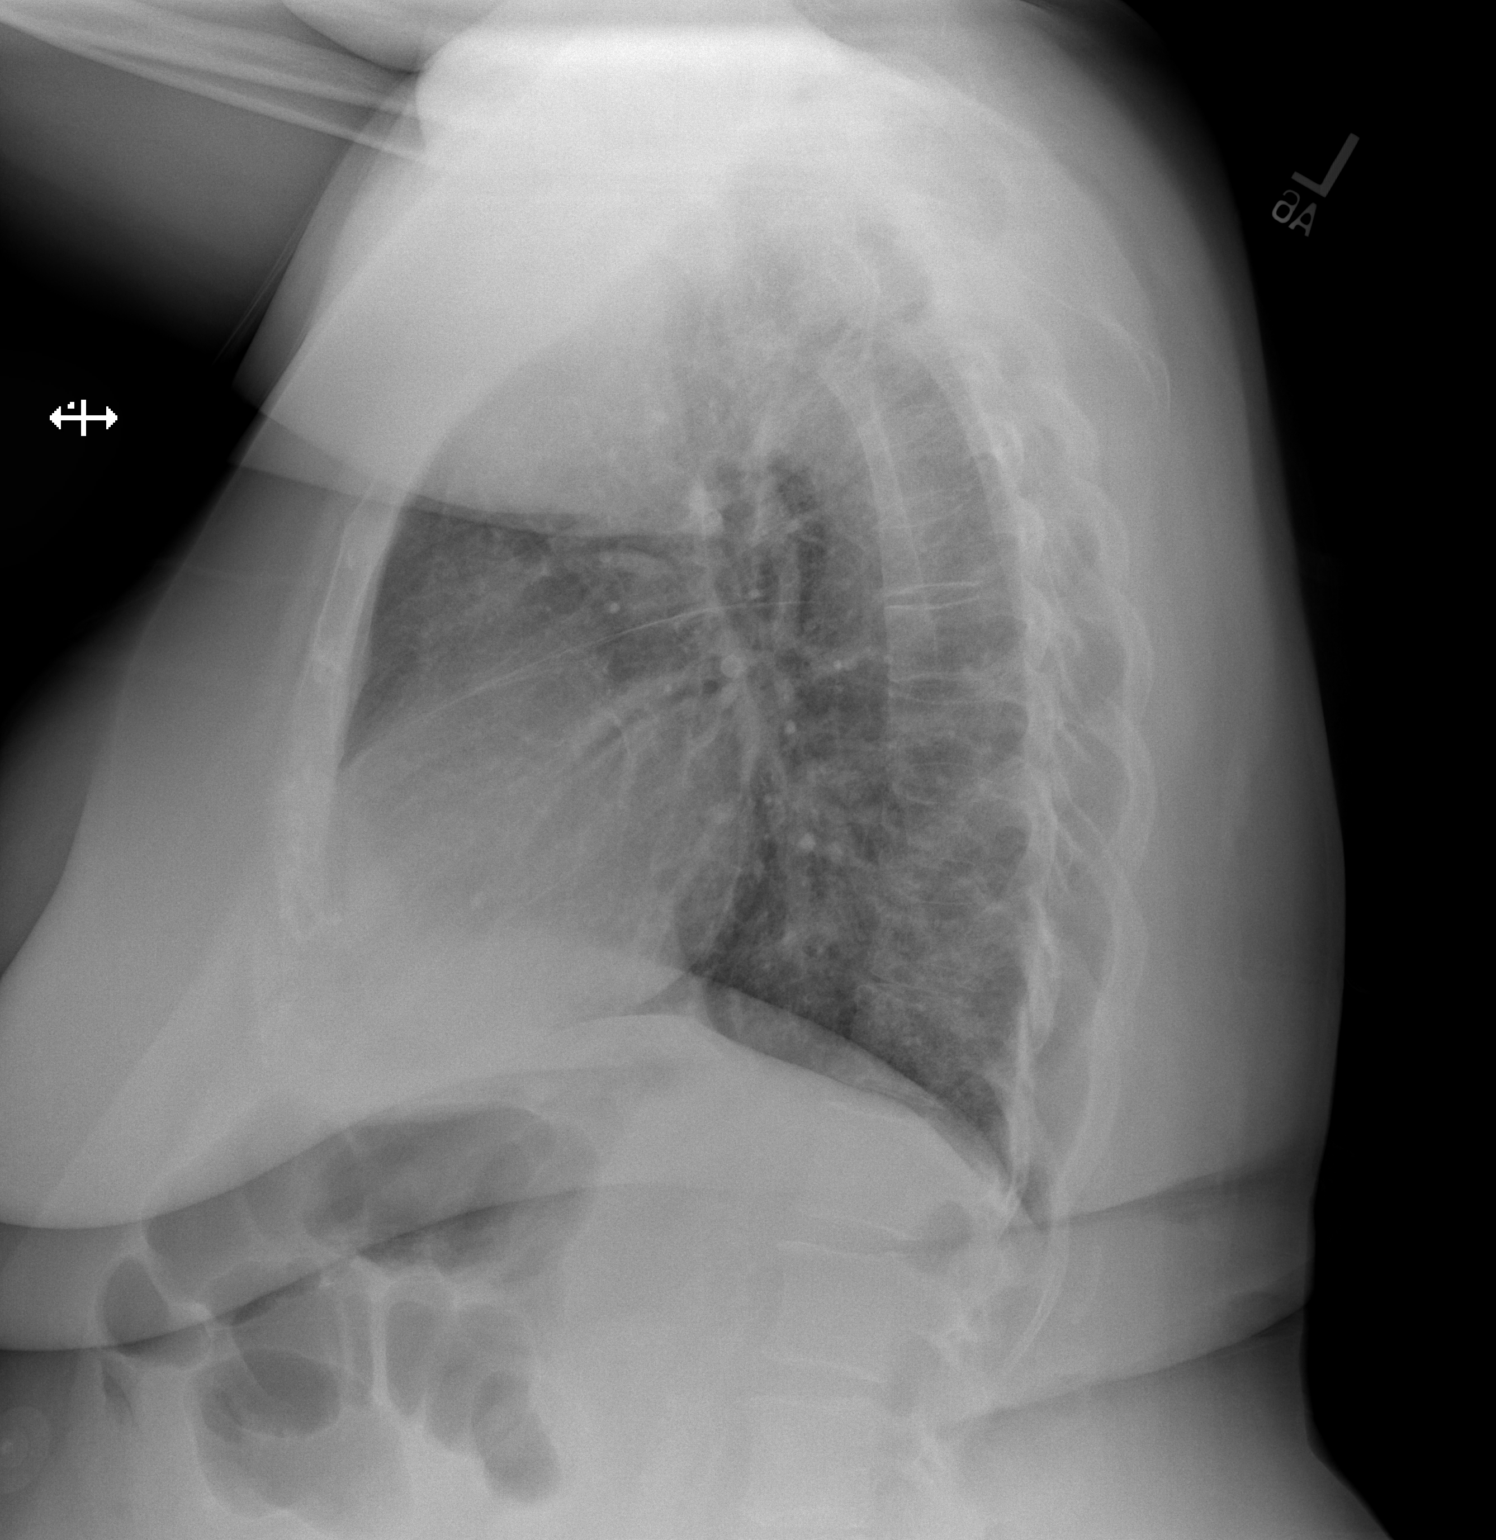

[w chest pa]
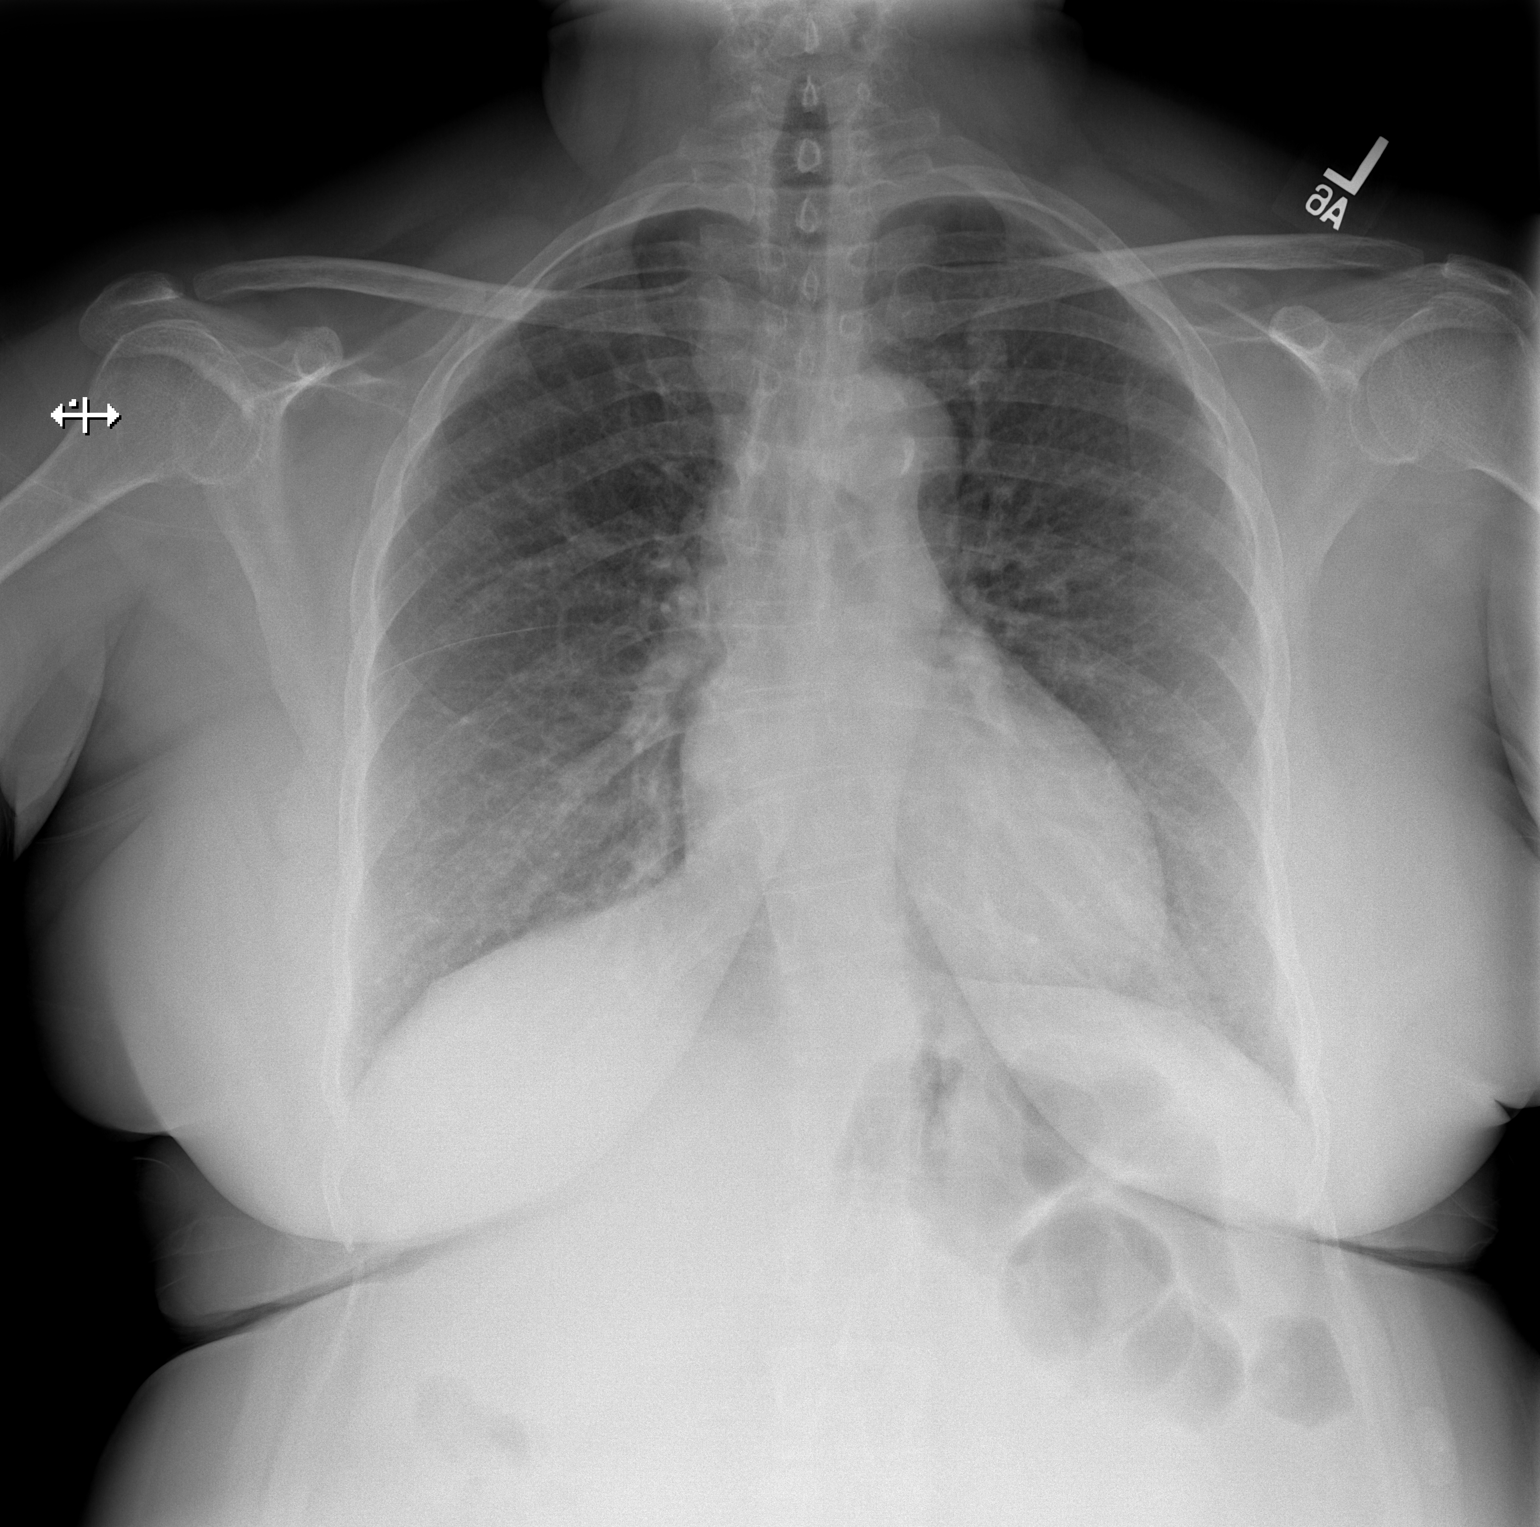

[2 of 2 positions shown; findings below may reference images not displayed]

FINDINGS: The lungs are well-aerated. Mild vascular congestion is noted.
Increased interstitial markings could reflect minimal interstitial
edema, given the patient's symptoms. There is no evidence of pleural
effusion or pneumothorax.

The heart is borderline normal in size. No acute osseous
abnormalities are seen.
IMPRESSION: Mild vascular congestion noted. Increased interstitial markings
could reflect minimal interstitial edema, given the patient's
symptoms.

## 2019-03-25 ENCOUNTER — Encounter (HOSPITAL_COMMUNITY): Payer: Self-pay | Admitting: Emergency Medicine

## 2019-03-25 ENCOUNTER — Emergency Department (HOSPITAL_COMMUNITY)
Admission: EM | Admit: 2019-03-25 | Discharge: 2019-03-25 | Disposition: A | Discharge status: Home / Self Care | Payer: No Typology Code available for payment source | Attending: Emergency Medicine | Admitting: Emergency Medicine

## 2019-03-25 ENCOUNTER — Emergency Department (HOSPITAL_COMMUNITY): Payer: No Typology Code available for payment source

## 2019-03-25 ENCOUNTER — Other Ambulatory Visit: Payer: Self-pay

## 2019-03-25 DIAGNOSIS — Y92415 Exit ramp or entrance ramp of street or highway as the place of occurrence of the external cause: Secondary | ICD-10-CM | POA: Insufficient documentation

## 2019-03-25 DIAGNOSIS — Z79899 Other long term (current) drug therapy: Secondary | ICD-10-CM | POA: Insufficient documentation

## 2019-03-25 DIAGNOSIS — I5032 Chronic diastolic (congestive) heart failure: Secondary | ICD-10-CM | POA: Insufficient documentation

## 2019-03-25 DIAGNOSIS — F17210 Nicotine dependence, cigarettes, uncomplicated: Secondary | ICD-10-CM | POA: Insufficient documentation

## 2019-03-25 DIAGNOSIS — I110 Hypertensive heart disease with heart failure: Secondary | ICD-10-CM | POA: Diagnosis not present

## 2019-03-25 DIAGNOSIS — Y999 Unspecified external cause status: Secondary | ICD-10-CM | POA: Insufficient documentation

## 2019-03-25 DIAGNOSIS — M542 Cervicalgia: Secondary | ICD-10-CM | POA: Diagnosis not present

## 2019-03-25 DIAGNOSIS — E119 Type 2 diabetes mellitus without complications: Secondary | ICD-10-CM | POA: Insufficient documentation

## 2019-03-25 DIAGNOSIS — Y9389 Activity, other specified: Secondary | ICD-10-CM | POA: Diagnosis not present

## 2019-03-25 DIAGNOSIS — R030 Elevated blood-pressure reading, without diagnosis of hypertension: Secondary | ICD-10-CM

## 2019-03-25 DIAGNOSIS — S199XXA Unspecified injury of neck, initial encounter: Secondary | ICD-10-CM | POA: Diagnosis present

## 2019-03-25 DIAGNOSIS — M7918 Myalgia, other site: Secondary | ICD-10-CM

## 2019-03-25 DIAGNOSIS — Z7984 Long term (current) use of oral hypoglycemic drugs: Secondary | ICD-10-CM | POA: Insufficient documentation

## 2019-03-25 DIAGNOSIS — M25511 Pain in right shoulder: Secondary | ICD-10-CM | POA: Diagnosis not present

## 2019-03-25 MED ORDER — LIDOCAINE 5 % EX PTCH: 1.0000 | MEDICATED_PATCH | CUTANEOUS | 0 refills | Status: DC

## 2019-03-25 MED ORDER — ACETAMINOPHEN 325 MG PO TABS
650.0000 mg | ORAL_TABLET | Freq: Once | ORAL | Status: AC
  Administered 2019-03-25: 650 mg via ORAL
  Filled 2019-03-25: qty 2

## 2019-03-25 MED ORDER — LIDOCAINE 5 % EX PTCH
1.0000 | MEDICATED_PATCH | CUTANEOUS | Status: DC
  Administered 2019-03-25: 1 via TRANSDERMAL
  Filled 2019-03-25: qty 1

## 2019-03-25 MED ORDER — METHOCARBAMOL 500 MG PO TABS: 500.0000 mg | ORAL_TABLET | Freq: Two times a day (BID) | ORAL | 0 refills | Status: AC

## 2019-03-25 MED ORDER — METHOCARBAMOL 500 MG PO TABS
500.0000 mg | ORAL_TABLET | Freq: Once | ORAL | Status: AC
  Administered 2019-03-25: 500 mg via ORAL
  Filled 2019-03-25: qty 1
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
## 2019-03-25 NOTE — ED Notes (Addendum)
Pt states 9/10 rt sided lower back , neck, and shoulder pain. Pt denies HA, visual changes.
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
## 2020-02-16 ENCOUNTER — Emergency Department (HOSPITAL_COMMUNITY): Payer: Self-pay

## 2020-02-16 ENCOUNTER — Emergency Department (HOSPITAL_COMMUNITY)
Admission: EM | Admit: 2020-02-16 | Discharge: 2020-02-17 | Disposition: A | Discharge status: Home / Self Care | Payer: Self-pay | Attending: Emergency Medicine | Admitting: Emergency Medicine

## 2020-02-16 ENCOUNTER — Other Ambulatory Visit: Payer: Self-pay

## 2020-02-16 ENCOUNTER — Encounter (HOSPITAL_COMMUNITY): Payer: Self-pay | Admitting: Emergency Medicine

## 2020-02-16 DIAGNOSIS — I5032 Chronic diastolic (congestive) heart failure: Secondary | ICD-10-CM | POA: Insufficient documentation

## 2020-02-16 DIAGNOSIS — F17210 Nicotine dependence, cigarettes, uncomplicated: Secondary | ICD-10-CM | POA: Insufficient documentation

## 2020-02-16 DIAGNOSIS — Z79899 Other long term (current) drug therapy: Secondary | ICD-10-CM | POA: Insufficient documentation

## 2020-02-16 DIAGNOSIS — I110 Hypertensive heart disease with heart failure: Secondary | ICD-10-CM | POA: Insufficient documentation

## 2020-02-16 DIAGNOSIS — W19XXXA Unspecified fall, initial encounter: Secondary | ICD-10-CM

## 2020-02-16 DIAGNOSIS — Y92012 Bathroom of single-family (private) house as the place of occurrence of the external cause: Secondary | ICD-10-CM | POA: Insufficient documentation

## 2020-02-16 DIAGNOSIS — E119 Type 2 diabetes mellitus without complications: Secondary | ICD-10-CM | POA: Insufficient documentation

## 2020-02-16 DIAGNOSIS — M545 Low back pain: Secondary | ICD-10-CM | POA: Insufficient documentation

## 2020-02-16 DIAGNOSIS — M25552 Pain in left hip: Secondary | ICD-10-CM | POA: Insufficient documentation

## 2020-02-16 DIAGNOSIS — W1830XA Fall on same level, unspecified, initial encounter: Secondary | ICD-10-CM | POA: Insufficient documentation

## 2020-02-16 DIAGNOSIS — M25551 Pain in right hip: Secondary | ICD-10-CM | POA: Insufficient documentation

## 2020-02-16 DIAGNOSIS — Y999 Unspecified external cause status: Secondary | ICD-10-CM | POA: Insufficient documentation

## 2020-02-16 DIAGNOSIS — Y939 Activity, unspecified: Secondary | ICD-10-CM | POA: Insufficient documentation

## 2020-02-16 DIAGNOSIS — Z7984 Long term (current) use of oral hypoglycemic drugs: Secondary | ICD-10-CM | POA: Insufficient documentation

## 2020-02-16 LAB — CBC
HCT: 46.2 % — ABNORMAL HIGH (ref 36.0–46.0)
Hemoglobin: 14.2 g/dL (ref 12.0–15.0)
MCH: 27 pg (ref 26.0–34.0)
MCHC: 30.7 g/dL (ref 30.0–36.0)
MCV: 87.8 fL (ref 80.0–100.0)
Platelets: 208 10*3/uL (ref 150–400)
RBC: 5.26 MIL/uL — ABNORMAL HIGH (ref 3.87–5.11)
RDW: 15.1 % (ref 11.5–15.5)
WBC: 8.9 10*3/uL (ref 4.0–10.5)
nRBC: 0 % (ref 0.0–0.2)

## 2020-02-16 LAB — BASIC METABOLIC PANEL
Anion gap: 11 (ref 5–15)
BUN: 18 mg/dL (ref 6–20)
CO2: 23 mmol/L (ref 22–32)
Calcium: 9.2 mg/dL (ref 8.9–10.3)
Chloride: 106 mmol/L (ref 98–111)
Creatinine, Ser: 0.9 mg/dL (ref 0.44–1.00)
GFR calc Af Amer: >60 mL/min (ref >60)
GFR calc non Af Amer: >60 mL/min (ref >60)
Glucose, Bld: 146 mg/dL — ABNORMAL HIGH (ref 70–99)
Potassium: 3.7 mmol/L (ref 3.5–5.1)
Sodium: 140 mmol/L (ref 135–145)

## 2020-02-16 MED ORDER — HYDROCODONE-ACETAMINOPHEN 5-325 MG PO TABS
2.0000 | ORAL_TABLET | Freq: Once | ORAL | Status: AC
  Administered 2020-02-17: 2 via ORAL
  Filled 2020-02-16: qty 2
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
## 2020-02-16 NOTE — ED Provider Notes (Signed)
[facility] EMERGENCY DEPARTMENT Provider Note   CSN: [id number] Arrival date & time: 02/16/20  1609     History Chief Complaint  Patient presents with  . Fall    Debbie Davis is a 59 y.o. female.  The history is provided by the patient and medical records.  Fall   59 y.o. F with hx of DM, CHF, HTN, obesity, presenting to the ED after a fall that occurred earlier this morning.  Patient reports her grandson was in the bathroom and got water on the floor.  She went in to check on him and immediately started slipping/sliding across the floor.  States her legs went out from under her and she did a split in the floor.  She denies head injury or LOC.  States floor is tile, very hard.  States she did mostly on her right side, particularly right back/hip area.  She does report pain in low back and both hips and somewhat of pulling sensation in her groin.  She has been ambulatory but with a lot of soreness.  Denies numbness/weakness of the legs, no bowel or bladder incontinence.  She did take some aleve after the fall but nothing since then.  Past Medical History:  Diagnosis Date  . Atherosclerosis of aorta (HCC) 11/25/2016  . Chronic diastolic heart failure (HCC) 11/25/2016  . Diabetes type 2, uncontrolled (HCC) 08/22/2016  . Hypertension   . Hypertensive heart disease 08/20/2016  . Obesity (BMI 30-39.9) 11/25/2016    Patient Active Problem List   Diagnosis Date Noted  . Progressive angina (HCC) 11/25/2016  . Atherosclerosis of aorta (HCC) 11/25/2016  . Chronic diastolic heart failure (HCC) 11/25/2016  . Obesity (BMI 30-39.9) 11/25/2016  . Dyspnea 11/25/2016  . Diabetes type 2, uncontrolled (HCC) 08/22/2016  . Hypertensive heart disease 08/20/2016    Past Surgical History:  Procedure Laterality Date  . KIDNEY SURGERY Right    removal of kidney stone  . RIGHT/LEFT HEART CATH AND CORONARY ANGIOGRAPHY N/A 11/26/2016   Procedure: Right/Left Heart Cath and Coronary  Angiography;  Surgeon: Marykay Lex, MD;  Location: [facility] INVASIVE CV LAB;  Service: Cardiovascular;  Laterality: N/A;  . TUBAL LIGATION       OB History   No obstetric history on file.     Family History  Problem Relation Age of Onset  . Cancer Mother   . Heart disease Father   . Diabetes Sister   . Diabetes Brother   . Diabetes Sister   . Diabetes Sister   . Diabetes Sister     Social History   Tobacco Use  . Smoking status: Current Every Day Smoker    Packs/day: 0.50    Years: 30.00    Pack years: 15.00    Types: Cigarettes  . Smokeless tobacco: Never Used  Substance Use Topics  . Alcohol use: Yes    Comment: special occasions   . Drug use: No    Home Medications Prior to Admission medications   Medication Sig Start Date End Date Taking? Authorizing Provider  albuterol (PROVENTIL HFA;VENTOLIN HFA) 108 (90 Base) MCG/ACT inhaler Inhale 1-2 puffs into the lungs every 4 (four) hours as needed for wheezing or shortness of breath. 08/12/16   Cheri Fowler, PA-C  amLODipine (NORVASC) 10 MG tablet Take 10 mg by mouth daily.    [provider]  hydrochlorothiazide (HYDRODIURIL) 25 MG tablet Take 1 tablet (25 mg total) by mouth daily. 08/25/16   Arrien, York Ram, MD  HYDROcodone-acetaminophen (  NORCO/VICODIN) 5-325 MG tablet Take 1-2 tablets by mouth every 6 hours as needed for pain and/or cough. Patient not taking: Reported on 03/25/2019 10/25/17   Pisciotta, Elmyra Ricks, PA-C  lidocaine (LIDODERM) 5 % Place 1 patch onto the skin daily. Remove & Discard patch within 12 hours or as directed by MD 03/25/19   Deliah Boston, PA-C  metFORMIN (GLUCOPHAGE) 500 MG tablet Take 1 tablet (500 mg total) by mouth 2 (two) times daily with a meal. 08/24/16   Arrien, Jimmy Picket, MD  metoprolol succinate (TOPROL-XL) 100 MG 24 hr tablet Take 100 mg by mouth 2 (two) times daily. Take with or immediately following a meal    [provider]  Multiple Vitamin (MULTIVITAMIN  WITH MINERALS) TABS tablet Take 1 tablet by mouth daily.     [provider]  naproxen sodium (ANAPROX) 220 MG tablet Take 220 mg by mouth 2 (two) times daily as needed (for pain).     [provider]  sulfamethoxazole-trimethoprim (BACTRIM DS) 800-160 MG tablet Take 2 tablets by mouth 2 (two) times daily. Patient not taking: Reported on 03/20/2018 10/25/17   Pisciotta, Elmyra Ricks, PA-C  umeclidinium-vilanterol (ANORO ELLIPTA) 62.5-25 MCG/INH AEPB Inhale 1 puff into the lungs daily as needed (shortness of breath).     [provider]    Allergies    Codeine and Morphine and related  Review of Systems   Review of Systems  Musculoskeletal: Positive for arthralgias and back pain.  All other systems reviewed and are negative.   Physical Exam Updated Vital Signs BP (!) 167/116 (BP Location: Left Arm)   Pulse (!) 114   Temp 99.2 F (37.3 C) (Oral)   Resp 18   Ht 5' (1.524 m)   Wt 86.2 kg   SpO2 100%   BMI 37.11 kg/m   Physical Exam Vitals and nursing note reviewed.  Constitutional:      Appearance: She is well-developed.     Comments: Sitting up texting on phone, NAD  HENT:     Head: Normocephalic and atraumatic.     Comments: No visible signs of head trauma Eyes:     Conjunctiva/sclera: Conjunctivae normal.     Pupils: Pupils are equal, round, and reactive to light.  Cardiovascular:     Rate and Rhythm: Normal rate and regular rhythm.     Heart sounds: Normal heart sounds.  Pulmonary:     Effort: Pulmonary effort is normal.     Breath sounds: Normal breath sounds.  Abdominal:     General: Bowel sounds are normal.     Palpations: Abdomen is soft.  Musculoskeletal:        General: Normal range of motion.     Cervical back: Normal range of motion.     Comments: Pelvis is stable and nontender, no leg shortening, no bruising or direct signs of trauma to the hips or low back Able to fully range both knees without apparent issue, no significant  swelling Normal strength and sensation of both legs, ambulatory  Skin:    General: Skin is warm and dry.  Neurological:     Mental Status: She is alert and oriented to person, place, and time.     Comments: AAOx3, answering questions and following commands appropriately; equal strength UE and LE bilaterally; CN grossly intact; moves all extremities appropriately without ataxia; no focal neuro deficits or facial asymmetry appreciated     ED Results / Procedures / Treatments   Labs (all labs ordered are listed, but  only abnormal results are displayed) Labs Reviewed  CBC - Abnormal; Notable for the following components:      Result Value   RBC 5.26 (*)    HCT 46.2 (*)    All other components within normal limits  BASIC METABOLIC PANEL - Abnormal; Notable for the following components:   Glucose, Bld 146 (*)    All other components within normal limits    EKG None  Radiology DG Lumbar Spine 2-3 Views  Result Date: 02/16/2020 CLINICAL DATA:  Lower back pain after fall EXAM: LUMBAR SPINE - 2-3 VIEW COMPARISON:  None. FINDINGS: Frontal and lateral views of the lumbar spine are obtained. There are 5 non-rib-bearing lumbar type vertebral bodies in normal alignment. No acute displaced fractures. Mild spondylosis at L3-4, L4-5 common L5-S1. Mild facet hypertrophy at L4-5 and L5-S1. Sacroiliac joints are normal. IMPRESSION: 1. Mild lower lumbar spondylosis and facet hypertrophy. No acute fracture. Electronically Signed   By: Sharlet Salina M.D.   On: 02/16/2020 17:55   DG HIPS BILAT WITH PELVIS 3-4 VIEWS  Result Date: 02/16/2020 CLINICAL DATA:  Bilateral hip pain, fell EXAM: DG HIP (WITH OR WITHOUT PELVIS) 3-4V BILAT COMPARISON:  None. FINDINGS: Frontal view of the pelvis as well as frontal and frogleg lateral views of both hips are obtained. No acute displaced fracture. Joint spaces are relatively well preserved. Remainder of the bony pelvis is unremarkable. Sacroiliac joints are normal.  IMPRESSION: 1. No acute displaced fracture. Electronically Signed   By: Sharlet Salina M.D.   On: 02/16/2020 17:55    Procedures Procedures (including critical care time)  Medications Ordered in ED Medications  HYDROcodone-acetaminophen (NORCO/VICODIN) 5-325 MG per tablet 2 tablet (has no administration in time range)    ED Course  I have reviewed the triage vital signs and the nursing notes.  Pertinent labs & imaging results that were available during my care of the patient were reviewed by me and considered in my medical decision making (see chart for details).    MDM Rules/Calculators/A&P  55 female presenting to the ED after a fall that occurred earlier this morning.  She slipped on some water in her bathroom, did a split, and landed on right hip/back.  No head injury or loss of consciousness.  She is able to get up out of the floor and ambulate at home.  She did take some Aleve but has had some worsening soreness throughout the day.  She is awake, alert, appropriately oriented here.  No focal neurologic deficits.  She does not have any contusions or bony deformities on exam.  Labs and screening x-rays were obtained from triage and are negative.  Patient remains ambulatory, low suspicion for occult fracture.  Patient given Norco for pain here which she has tolerated well in the past, plan discharge home with small supply of same as she will likely be sore for a few days.  We will have her modify activity, but continue moving to avoid stiffness.  She will follow-up with her PCP.  She may return here for any new or acute changes.  Final Clinical Impression(s) / ED Diagnoses Final diagnoses:  Fall, initial encounter    Rx / DC Orders ED Discharge Orders         Ordered    HYDROcodone-acetaminophen (NORCO/VICODIN) 5-325 MG tablet  Every 4 hours PRN     02/17/20 0010           Garlon Hatchet, PA-C 02/17/20 0123    Dione Booze, MD  02/17/20 0731  

## 2020-02-17 MED ORDER — HYDROCODONE-ACETAMINOPHEN 5-325 MG PO TABS: 1.0000 | ORAL_TABLET | ORAL | 0 refills | Status: DC | PRN

## 2020-02-17 NOTE — Discharge Instructions (Signed)
Take the prescribed medication as directed.  Can continue to take aleve between doses if needed.  Do not drive while taking pain medication though, it can make you drowsy. Keep yourself moving at home to avoid getting stiff but don't over do it with heavy lifting, pushing, pulling, etc. Follow-up with your primary care doctor. Return to the ED for new or worsening symptoms.

## 2020-02-18 ENCOUNTER — Other Ambulatory Visit: Payer: Self-pay

## 2020-02-18 ENCOUNTER — Encounter (HOSPITAL_COMMUNITY): Payer: Self-pay | Admitting: *Deleted

## 2020-02-18 ENCOUNTER — Emergency Department (HOSPITAL_COMMUNITY)
Admission: EM | Admit: 2020-02-18 | Discharge: 2020-02-18 | Disposition: A | Discharge status: Home / Self Care | Payer: Self-pay | Attending: Emergency Medicine | Admitting: Emergency Medicine

## 2020-02-18 DIAGNOSIS — E669 Obesity, unspecified: Secondary | ICD-10-CM | POA: Insufficient documentation

## 2020-02-18 DIAGNOSIS — I5032 Chronic diastolic (congestive) heart failure: Secondary | ICD-10-CM | POA: Insufficient documentation

## 2020-02-18 DIAGNOSIS — F17210 Nicotine dependence, cigarettes, uncomplicated: Secondary | ICD-10-CM | POA: Insufficient documentation

## 2020-02-18 DIAGNOSIS — I110 Hypertensive heart disease with heart failure: Secondary | ICD-10-CM | POA: Insufficient documentation

## 2020-02-18 DIAGNOSIS — Z7984 Long term (current) use of oral hypoglycemic drugs: Secondary | ICD-10-CM | POA: Insufficient documentation

## 2020-02-18 DIAGNOSIS — Z79899 Other long term (current) drug therapy: Secondary | ICD-10-CM | POA: Insufficient documentation

## 2020-02-18 DIAGNOSIS — R519 Headache, unspecified: Secondary | ICD-10-CM | POA: Insufficient documentation

## 2020-02-18 DIAGNOSIS — Z6837 Body mass index (BMI) 37.0-37.9, adult: Secondary | ICD-10-CM | POA: Insufficient documentation

## 2020-02-18 DIAGNOSIS — E119 Type 2 diabetes mellitus without complications: Secondary | ICD-10-CM | POA: Insufficient documentation

## 2020-02-18 DIAGNOSIS — I160 Hypertensive urgency: Secondary | ICD-10-CM | POA: Insufficient documentation

## 2020-02-18 LAB — CBC WITH DIFFERENTIAL/PLATELET
Abs Immature Granulocytes: 0.02 10*3/uL (ref 0.00–0.07)
Basophils Absolute: 0 10*3/uL (ref 0.0–0.1)
Basophils Relative: 0 %
Eosinophils Absolute: 0.1 10*3/uL (ref 0.0–0.5)
Eosinophils Relative: 2 %
HCT: 45.6 % (ref 36.0–46.0)
Hemoglobin: 14 g/dL (ref 12.0–15.0)
Immature Granulocytes: 0 %
Lymphocytes Relative: 21 %
Lymphs Abs: 1.7 10*3/uL (ref 0.7–4.0)
MCH: 27.3 pg (ref 26.0–34.0)
MCHC: 30.7 g/dL (ref 30.0–36.0)
MCV: 88.9 fL (ref 80.0–100.0)
Monocytes Absolute: 0.6 10*3/uL (ref 0.1–1.0)
Monocytes Relative: 7 %
Neutro Abs: 5.4 10*3/uL (ref 1.7–7.7)
Neutrophils Relative %: 70 %
Platelets: 171 10*3/uL (ref 150–400)
RBC: 5.13 MIL/uL — ABNORMAL HIGH (ref 3.87–5.11)
RDW: 15.1 % (ref 11.5–15.5)
WBC: 7.8 10*3/uL (ref 4.0–10.5)
nRBC: 0 % (ref 0.0–0.2)

## 2020-02-18 LAB — BASIC METABOLIC PANEL
Anion gap: 10 (ref 5–15)
BUN: 19 mg/dL (ref 6–20)
CO2: 22 mmol/L (ref 22–32)
Calcium: 8.9 mg/dL (ref 8.9–10.3)
Chloride: 108 mmol/L (ref 98–111)
Creatinine, Ser: 0.9 mg/dL (ref 0.44–1.00)
GFR calc Af Amer: >60 mL/min (ref >60)
GFR calc non Af Amer: >60 mL/min (ref >60)
Glucose, Bld: 168 mg/dL — ABNORMAL HIGH (ref 70–99)
Potassium: 5.1 mmol/L (ref 3.5–5.1)
Sodium: 140 mmol/L (ref 135–145)

## 2020-02-18 MED ORDER — AMLODIPINE BESYLATE 5 MG PO TABS
10.0000 mg | ORAL_TABLET | Freq: Once | ORAL | Status: AC
  Administered 2020-02-18: 10 mg via ORAL
  Filled 2020-02-18: qty 2

## 2020-02-18 MED ORDER — HYDROCHLOROTHIAZIDE 25 MG PO TABS
25.0000 mg | ORAL_TABLET | Freq: Once | ORAL | Status: AC
  Administered 2020-02-18: 25 mg via ORAL
  Filled 2020-02-18: qty 1

## 2020-02-18 MED ORDER — METOPROLOL SUCCINATE ER 100 MG PO TB24: 100.0000 mg | ORAL_TABLET | Freq: Two times a day (BID) | ORAL | 1 refills | Status: DC

## 2020-02-18 MED ORDER — IBUPROFEN 400 MG PO TABS
600.0000 mg | ORAL_TABLET | Freq: Once | ORAL | Status: AC
  Administered 2020-02-18: 600 mg via ORAL
  Filled 2020-02-18: qty 1

## 2020-02-18 MED ORDER — METFORMIN HCL 500 MG PO TABS: 500.0000 mg | ORAL_TABLET | Freq: Two times a day (BID) | ORAL | 1 refills | Status: DC

## 2020-02-18 MED ORDER — METOPROLOL SUCCINATE ER 100 MG PO TB24
100.0000 mg | ORAL_TABLET | Freq: Once | ORAL | Status: AC
  Administered 2020-02-18: 100 mg via ORAL
  Filled 2020-02-18: qty 1

## 2020-02-18 MED ORDER — HYDROCHLOROTHIAZIDE 25 MG PO TABS: 25.0000 mg | ORAL_TABLET | Freq: Every day | ORAL | 1 refills | Status: DC

## 2020-02-18 MED ORDER — AMLODIPINE BESYLATE 10 MG PO TABS: 10.0000 mg | ORAL_TABLET | Freq: Every day | ORAL | 1 refills | Status: DC

## 2020-02-18 NOTE — Discharge Instructions (Addendum)
If you develop continued, recurrent, or worsening headache, fever, neck stiffness, vomiting, blurry or double vision, weakness or numbness in your arms or legs, trouble speaking, or any other new/concerning symptoms then return to the ER for evaluation.  

## 2020-02-18 NOTE — ED Triage Notes (Signed)
[facility] for a headache starting this am.  She has been out of her amlodipine, metoprolol, hctz since Tues.  She is almost out of her metformin.  Denies weakness or blurred vision.

## 2020-02-18 NOTE — ED Provider Notes (Signed)
[facility] EMERGENCY DEPARTMENT Provider Note   CSN: [id number] Arrival date & time: 02/18/20  9470     History Chief Complaint  Patient presents with  . Headache  . Hypertension    Debbie Davis is a 59 y.o. female.  HPI 59 year old female presents with frontal headache.  Ran out of her medicines on 5/18.  Had a headache when she first woke up at around 5:30 AM.  Rates as a 5/10 frontal headache that is throbbing.  No vision change, weakness or numbness or chest pain.  No fevers.  Headache feels similar to when she has had high blood pressure before.  She went to the health department and her blood pressure was over 962 systolic so they called 836.  Has not take anything for the headache.  She feels generally sore in her back and legs due to the fall a few days ago.  Past Medical History:  Diagnosis Date  . Atherosclerosis of aorta ([place]) 11/25/2016  . Chronic diastolic heart failure ([place]) 11/25/2016  . Diabetes type 2, uncontrolled ([place]) 08/22/2016  . Hypertension   . Hypertensive heart disease 08/20/2016  . Obesity (BMI 30-39.9) 11/25/2016    Patient Active Problem List   Diagnosis Date Noted  . Progressive angina ([place]) 11/25/2016  . Atherosclerosis of aorta ([place]) 11/25/2016  . Chronic diastolic heart failure ([place]) 11/25/2016  . Obesity (BMI 30-39.9) 11/25/2016  . Dyspnea 11/25/2016  . Diabetes type 2, uncontrolled ([place]) 08/22/2016  . Hypertensive heart disease 08/20/2016    Past Surgical History:  Procedure Laterality Date  . KIDNEY SURGERY Right    removal of kidney stone  . RIGHT/LEFT HEART CATH AND CORONARY ANGIOGRAPHY N/A 11/26/2016   Procedure: Right/Left Heart Cath and Coronary Angiography;  Surgeon: Leonie Man, MD;  Location: [place] CV LAB;  Service: Cardiovascular;  Laterality: N/A;  . TUBAL LIGATION       OB History   No obstetric history on file.     Family History  Problem Relation Age of Onset  . Cancer Mother   .  Heart disease Father   . Diabetes Sister   . Diabetes Brother   . Diabetes Sister   . Diabetes Sister   . Diabetes Sister     Social History   Tobacco Use  . Smoking status: Current Every Day Smoker    Packs/day: 0.50    Years: 30.00    Pack years: 15.00    Types: Cigarettes  . Smokeless tobacco: Never Used  Substance Use Topics  . Alcohol use: Yes    Comment: special occasions   . Drug use: No    Home Medications Prior to Admission medications   Medication Sig Start Date End Date Taking? Authorizing Provider  albuterol (PROVENTIL HFA;VENTOLIN HFA) 108 (90 Base) MCG/ACT inhaler Inhale 1-2 puffs into the lungs every 4 (four) hours as needed for wheezing or shortness of breath. 08/12/16  Yes Gloriann Loan, PA-C  HYDROcodone-acetaminophen (NORCO/VICODIN) 5-325 MG tablet Take 1 tablet by mouth every 4 (four) hours as needed. Patient taking differently: Take 1 tablet by mouth every 4 (four) hours as needed for severe pain.  02/17/20  Yes Larene Pickett, PA-C  lidocaine (LIDODERM) 5 % Place 1 patch onto the skin daily. Remove & Discard patch within 12 hours or as directed by MD 03/25/19  Yes Nuala Alpha A, PA-C  Multiple Vitamin (MULTIVITAMIN WITH MINERALS) TABS tablet Take 1 tablet by mouth daily.    Yes [provider]  naproxen sodium (ANAPROX) 220 MG tablet Take 220 mg by mouth 2 (two) times daily as needed (for pain).    Yes [provider]  umeclidinium-vilanterol (ANORO ELLIPTA) 62.5-25 MCG/INH AEPB Inhale 1 puff into the lungs daily as needed (shortness of breath).    Yes [provider]  amLODipine (NORVASC) 10 MG tablet Take 1 tablet (10 mg total) by mouth daily. 02/18/20   Pricilla Loveless, MD  hydrochlorothiazide (HYDRODIURIL) 25 MG tablet Take 1 tablet (25 mg total) by mouth daily. 02/18/20   Pricilla Loveless, MD  metFORMIN (GLUCOPHAGE) 500 MG tablet Take 1 tablet (500 mg total) by mouth 2 (two) times daily with a meal. 02/18/20   Pricilla Loveless, MD   metoprolol succinate (TOPROL-XL) 100 MG 24 hr tablet Take 1 tablet (100 mg total) by mouth 2 (two) times daily. Take with or immediately following a meal 02/18/20   Pricilla Loveless, MD  sulfamethoxazole-trimethoprim (BACTRIM DS) 800-160 MG tablet Take 2 tablets by mouth 2 (two) times daily. Patient not taking: Reported on 03/20/2018 10/25/17   Pisciotta, Joni Reining, PA-C    Allergies    Codeine and Morphine and related  Review of Systems   Review of Systems  Constitutional: Negative for fever.  Eyes: Negative for visual disturbance.  Cardiovascular: Negative for chest pain.  Neurological: Positive for headaches. Negative for weakness and numbness.  All other systems reviewed and are negative.   Physical Exam Updated Vital Signs BP (!) 199/119 (BP Location: Right Arm)   Pulse 94   Temp 98.8 F (37.1 C) (Oral)   Resp 20   Ht 5' (1.524 m)   Wt 86.2 kg   SpO2 100%   BMI 37.11 kg/m   Physical Exam Vitals and nursing note reviewed.  Constitutional:      General: She is not in acute distress.    Appearance: She is well-developed. She is not ill-appearing or diaphoretic.  HENT:     Head: Normocephalic and atraumatic.     Right Ear: External ear normal.     Left Ear: External ear normal.     Nose: Nose normal.  Eyes:     General:        Right eye: No discharge.        Left eye: No discharge.     Extraocular Movements: Extraocular movements intact.     Pupils: Pupils are equal, round, and reactive to light.  Cardiovascular:     Rate and Rhythm: Normal rate and regular rhythm.     Heart sounds: Normal heart sounds.  Pulmonary:     Effort: Pulmonary effort is normal.     Breath sounds: Normal breath sounds.  Abdominal:     Palpations: Abdomen is soft.     Tenderness: There is no abdominal tenderness.  Skin:    General: Skin is warm and dry.  Neurological:     Mental Status: She is alert.     Comments: CN 3-12 grossly intact. 5/5 strength in all 4 extremities. Grossly normal  sensation. Normal finger to nose.   Psychiatric:        Mood and Affect: Mood is not anxious.     ED Results / Procedures / Treatments   Labs (all labs ordered are listed, but only abnormal results are displayed) Labs Reviewed  BASIC METABOLIC PANEL - Abnormal; Notable for the following components:      Result Value   Glucose, Bld 168 (*)    All other components within normal limits  CBC WITH DIFFERENTIAL/PLATELET -  Abnormal; Notable for the following components:   RBC 5.13 (*)    All other components within normal limits    EKG None  Radiology DG Lumbar Spine 2-3 Views  Result Date: 02/16/2020 CLINICAL DATA:  Lower back pain after fall EXAM: LUMBAR SPINE - 2-3 VIEW COMPARISON:  None. FINDINGS: Frontal and lateral views of the lumbar spine are obtained. There are 5 non-rib-bearing lumbar type vertebral bodies in normal alignment. No acute displaced fractures. Mild spondylosis at L3-4, L4-5 common L5-S1. Mild facet hypertrophy at L4-5 and L5-S1. Sacroiliac joints are normal. IMPRESSION: 1. Mild lower lumbar spondylosis and facet hypertrophy. No acute fracture. Electronically Signed   By: Sharlet Salina M.D.   On: 02/16/2020 17:55   DG HIPS BILAT WITH PELVIS 3-4 VIEWS  Result Date: 02/16/2020 CLINICAL DATA:  Bilateral hip pain, fell EXAM: DG HIP (WITH OR WITHOUT PELVIS) 3-4V BILAT COMPARISON:  None. FINDINGS: Frontal view of the pelvis as well as frontal and frogleg lateral views of both hips are obtained. No acute displaced fracture. Joint spaces are relatively well preserved. Remainder of the bony pelvis is unremarkable. Sacroiliac joints are normal. IMPRESSION: 1. No acute displaced fracture. Electronically Signed   By: Sharlet Salina M.D.   On: 02/16/2020 17:55    Procedures Procedures (including critical care time)  Medications Ordered in ED Medications  ibuprofen (ADVIL) tablet 600 mg (has no administration in time range)  amLODipine (NORVASC) tablet 10 mg (10 mg Oral Given  02/18/20 1020)  hydrochlorothiazide (HYDRODIURIL) tablet 25 mg (25 mg Oral Given 02/18/20 1020)  metoprolol succinate (TOPROL-XL) 24 hr tablet 100 mg (100 mg Oral Given 02/18/20 1020)    ED Course  I have reviewed the triage vital signs and the nursing notes.  Pertinent labs & imaging results that were available during my care of the patient were reviewed by me and considered in my medical decision making (see chart for details).    MDM Rules/Calculators/A&P                      Patient's headache is moderate and is when she has had before with high blood pressure.  My suspicion for acute CNS emergency such as stroke, head bleed, subarachnoid hemorrhage, infection, is all very low.  Improving a little bit since being in the ED.  I will refill her home BP meds and Metformin and have her follow-up with her PCP, who she states she still sees.  Discussed the importance of chronic blood pressure management.  Discharged home with return precautions. Final Clinical Impression(s) / ED Diagnoses Final diagnoses:  Frontal headache  Hypertensive urgency    Rx / DC Orders ED Discharge Orders         Ordered    amLODipine (NORVASC) 10 MG tablet  Daily     02/18/20 1143    metFORMIN (GLUCOPHAGE) 500 MG tablet  2 times daily with meals     02/18/20 1143    metoprolol succinate (TOPROL-XL) 100 MG 24 hr tablet  2 times daily     02/18/20 1143    hydrochlorothiazide (HYDRODIURIL) 25 MG tablet  Daily     02/18/20 1143           Pricilla Loveless, MD 02/18/20 1149

## 2020-07-12 ENCOUNTER — Emergency Department (HOSPITAL_COMMUNITY): Payer: Self-pay

## 2020-07-12 ENCOUNTER — Emergency Department (HOSPITAL_COMMUNITY)
Admission: EM | Admit: 2020-07-12 | Discharge: 2020-07-12 | Disposition: A | Discharge status: Home / Self Care | Payer: Self-pay | Attending: Emergency Medicine | Admitting: Emergency Medicine

## 2020-07-12 DIAGNOSIS — I110 Hypertensive heart disease with heart failure: Secondary | ICD-10-CM | POA: Insufficient documentation

## 2020-07-12 DIAGNOSIS — I5032 Chronic diastolic (congestive) heart failure: Secondary | ICD-10-CM | POA: Insufficient documentation

## 2020-07-12 DIAGNOSIS — Z79899 Other long term (current) drug therapy: Secondary | ICD-10-CM | POA: Insufficient documentation

## 2020-07-12 DIAGNOSIS — F17210 Nicotine dependence, cigarettes, uncomplicated: Secondary | ICD-10-CM | POA: Insufficient documentation

## 2020-07-12 DIAGNOSIS — I10 Essential (primary) hypertension: Secondary | ICD-10-CM

## 2020-07-12 DIAGNOSIS — Z7984 Long term (current) use of oral hypoglycemic drugs: Secondary | ICD-10-CM | POA: Insufficient documentation

## 2020-07-12 DIAGNOSIS — E119 Type 2 diabetes mellitus without complications: Secondary | ICD-10-CM | POA: Insufficient documentation

## 2020-07-12 LAB — CBC WITH DIFFERENTIAL/PLATELET
Abs Immature Granulocytes: 0.03 10*3/uL (ref 0.00–0.07)
Basophils Absolute: 0.1 10*3/uL (ref 0.0–0.1)
Basophils Relative: 1 %
Eosinophils Absolute: 0.2 10*3/uL (ref 0.0–0.5)
Eosinophils Relative: 2 %
HCT: 46.9 % — ABNORMAL HIGH (ref 36.0–46.0)
Hemoglobin: 14.3 g/dL (ref 12.0–15.0)
Immature Granulocytes: 0 %
Lymphocytes Relative: 26 %
Lymphs Abs: 2.1 10*3/uL (ref 0.7–4.0)
MCH: 26.2 pg (ref 26.0–34.0)
MCHC: 30.5 g/dL (ref 30.0–36.0)
MCV: 86.1 fL (ref 80.0–100.0)
Monocytes Absolute: 0.5 10*3/uL (ref 0.1–1.0)
Monocytes Relative: 6 %
Neutro Abs: 5.3 10*3/uL (ref 1.7–7.7)
Neutrophils Relative %: 65 %
Platelets: 218 10*3/uL (ref 150–400)
RBC: 5.45 MIL/uL — ABNORMAL HIGH (ref 3.87–5.11)
RDW: 15.2 % (ref 11.5–15.5)
WBC: 8.1 10*3/uL (ref 4.0–10.5)
nRBC: 0 % (ref 0.0–0.2)

## 2020-07-12 LAB — I-STAT CHEM 8, ED
BUN: 12 mg/dL (ref 6–20)
Calcium, Ion: 1.17 mmol/L (ref 1.15–1.40)
Chloride: 100 mmol/L (ref 98–111)
Creatinine, Ser: 0.8 mg/dL (ref 0.44–1.00)
Glucose, Bld: 127 mg/dL — ABNORMAL HIGH (ref 70–99)
HCT: 46 % (ref 36.0–46.0)
Hemoglobin: 15.6 g/dL — ABNORMAL HIGH (ref 12.0–15.0)
Potassium: 3.9 mmol/L (ref 3.5–5.1)
Sodium: 139 mmol/L (ref 135–145)
TCO2: 28 mmol/L (ref 22–32)

## 2020-07-12 LAB — COMPREHENSIVE METABOLIC PANEL
ALT: 18 U/L (ref 0–44)
AST: 18 U/L (ref 15–41)
Albumin: 3.5 g/dL (ref 3.5–5.0)
Alkaline Phosphatase: 93 U/L (ref 38–126)
Anion gap: 11 (ref 5–15)
BUN: 14 mg/dL (ref 6–20)
CO2: 27 mmol/L (ref 22–32)
Calcium: 9.4 mg/dL (ref 8.9–10.3)
Chloride: 100 mmol/L (ref 98–111)
Creatinine, Ser: 0.9 mg/dL (ref 0.44–1.00)
GFR, Estimated: >60 mL/min (ref >60)
Glucose, Bld: 126 mg/dL — ABNORMAL HIGH (ref 70–99)
Potassium: 3.8 mmol/L (ref 3.5–5.1)
Sodium: 138 mmol/L (ref 135–145)
Total Bilirubin: 0.5 mg/dL (ref 0.3–1.2)
Total Protein: 7.2 g/dL (ref 6.5–8.1)

## 2020-07-12 LAB — BRAIN NATRIURETIC PEPTIDE: B Natriuretic Peptide: 19.9 pg/mL (ref 0.0–100.0)

## 2020-07-12 MED ORDER — HYDROCHLOROTHIAZIDE 25 MG PO TABS: 25.0000 mg | ORAL_TABLET | Freq: Every day | ORAL | 1 refills | Status: DC

## 2020-07-12 MED ORDER — METFORMIN HCL 500 MG PO TABS: 500.0000 mg | ORAL_TABLET | Freq: Two times a day (BID) | ORAL | 1 refills | Status: AC

## 2020-07-12 MED ORDER — AMLODIPINE BESYLATE 10 MG PO TABS: 10.0000 mg | ORAL_TABLET | Freq: Every day | ORAL | 1 refills | Status: DC

## 2020-07-12 MED ORDER — METOPROLOL SUCCINATE ER 100 MG PO TB24
100.0000 mg | ORAL_TABLET | Freq: Once | ORAL | Status: AC
  Administered 2020-07-12: 100 mg via ORAL
  Filled 2020-07-12: qty 1

## 2020-07-12 MED ORDER — CLONIDINE HCL 0.1 MG PO TABS
0.1000 mg | ORAL_TABLET | Freq: Once | ORAL | Status: AC
  Administered 2020-07-12: 0.1 mg via ORAL
  Filled 2020-07-12: qty 1

## 2020-07-12 MED ORDER — METOPROLOL SUCCINATE ER 100 MG PO TB24: 100.0000 mg | ORAL_TABLET | Freq: Every day | ORAL | 1 refills | Status: DC

## 2020-07-12 NOTE — ED Provider Notes (Signed)
[facility]ENCY [facility] Provider Note   CSN: [id number] Arrival date & time: 07/12/20  1529     History Chief Complaint  Patient presents with  . Hypertension    Debbie Davis is a 59 y.o. female.  Patient is a 59 year old female who has a history of diabetes, hypertension and obesity who presents with elevated blood pressures.  She went to her PCPs office for routine checkup and was noted to have high blood pressure.  Her PCP called EMS and sent her here.  She is complaining of a bifrontal headache.  She has had similar headaches in the past when her blood pressures been elevated.  She denies any new or changing headaches.  No nausea or vomiting.  No numbness or weakness to her extremities.  No vision changes or speech deficits.  No recent trauma.  She does note some shortness of breath for the last 2 to 3 days.  She says she has a history of congestive heart failure and previously was on Lasix but she denies being on it currently.  She says that she does have some intermittent shortness of breath at baseline.  She is not sure if it is worse but she has noted a little bit of shortness of breath today.  No associated chest pain.  She has not noted any leg swelling but her legs feel tight.  She thinks she might have gained about 2 pounds.  She has been out of her blood pressure medicines for the last 3 days.  This includes amlodipine, metoprolol, valsartan and hydrochlorothiazide.        Past Medical History:  Diagnosis Date  . Atherosclerosis of aorta (HCC) 11/25/2016  . Chronic diastolic heart failure (HCC) 11/25/2016  . Diabetes type 2, uncontrolled (HCC) 08/22/2016  . Hypertension   . Hypertensive heart disease 08/20/2016  . Obesity (BMI 30-39.9) 11/25/2016    Patient Active Problem List   Diagnosis Date Noted  . Progressive angina (HCC) 11/25/2016  . Atherosclerosis of aorta (HCC) 11/25/2016  . Chronic diastolic heart failure (HCC) 11/25/2016  .  Obesity (BMI 30-39.9) 11/25/2016  . Dyspnea 11/25/2016  . Diabetes type 2, uncontrolled (HCC) 08/22/2016  . Hypertensive heart disease 08/20/2016    Past Surgical History:  Procedure Laterality Date  . KIDNEY SURGERY Right    removal of kidney stone  . RIGHT/LEFT HEART CATH AND CORONARY ANGIOGRAPHY N/A 11/26/2016   Procedure: Right/Left Heart Cath and Coronary Angiography;  Surgeon: Marykay Lexavid W Harding, MD;  [facility];  Service: Cardiovascular;  Laterality: N/A;  . TUBAL LIGATION       OB History   No obstetric history on file.     Family History  Problem Relation Age of Onset  . Cancer Mother   . Heart disease Father   . Diabetes Sister   . Diabetes Brother   . Diabetes Sister   . Diabetes Sister   . Diabetes Sister     Social History   Tobacco Use  . Smoking status: Current Every Day Smoker    Packs/day: 0.50    Years: 30.00    Pack years: 15.00    Types: Cigarettes  . Smokeless tobacco: Never Used  Substance Use Topics  . Alcohol use: Yes    Comment: special occasions   . Drug use: No    Home Medications Prior to Admission medications   Medication Sig Start Date End Date Taking? Authorizing Provider  albuterol (PROVENTIL HFA;VENTOLIN HFA) 108 (90 Base)  MCG/ACT inhaler Inhale 1-2 puffs into the lungs every 4 (four) hours as needed for wheezing or shortness of breath. 08/12/16   Cheri Fowler, PA-C  amLODipine (NORVASC) 10 MG tablet Take 1 tablet (10 mg total) by mouth daily. 07/12/20   Rolan Bucco, MD  hydrochlorothiazide (HYDRODIURIL) 25 MG tablet Take 1 tablet (25 mg total) by mouth daily. 07/12/20   Rolan Bucco, MD  HYDROcodone-acetaminophen (NORCO/VICODIN) 5-325 MG tablet Take 1 tablet by mouth every 4 (four) hours as needed. Patient taking differently: Take 1 tablet by mouth every 4 (four) hours as needed for severe pain.  02/17/20   Garlon Hatchet, PA-C  lidocaine (LIDODERM) 5 % Place 1 patch onto the skin daily. Remove & Discard patch  within 12 hours or as directed by MD 03/25/19   Bill Salinas, PA-C  metFORMIN (GLUCOPHAGE) 500 MG tablet Take 1 tablet (500 mg total) by mouth 2 (two) times daily with a meal. 07/12/20   Rolan Bucco, MD  metoprolol succinate (TOPROL-XL) 100 MG 24 hr tablet Take 1 tablet (100 mg total) by mouth daily. Take with or immediately following a meal 07/12/20   Rolan Bucco, MD  Multiple Vitamin (MULTIVITAMIN WITH MINERALS) TABS tablet Take 1 tablet by mouth daily.     [provider]  naproxen sodium (ANAPROX) 220 MG tablet Take 220 mg by mouth 2 (two) times daily as needed (for pain).     [provider]  sulfamethoxazole-trimethoprim (BACTRIM DS) 800-160 MG tablet Take 2 tablets by mouth 2 (two) times daily. Patient not taking: Reported on 03/20/2018 10/25/17   Pisciotta, Joni Reining, PA-C  umeclidinium-vilanterol (ANORO ELLIPTA) 62.5-25 MCG/INH AEPB Inhale 1 puff into the lungs daily as needed (shortness of breath).     [provider]    Allergies    Codeine and Morphine and related  Review of Systems   Review of Systems  Constitutional: Positive for unexpected weight change (2 lbs gain). Negative for chills, diaphoresis, fatigue and fever.  HENT: Negative for congestion, rhinorrhea and sneezing.   Eyes: Negative.   Respiratory: Positive for shortness of breath. Negative for cough and chest tightness.   Cardiovascular: Negative for chest pain and leg swelling.  Gastrointestinal: Negative for abdominal pain, blood in stool, diarrhea, nausea and vomiting.  Genitourinary: Negative for difficulty urinating, flank pain, frequency and hematuria.  Musculoskeletal: Negative for arthralgias and back pain.  Skin: Negative for rash.  Neurological: Positive for headaches. Negative for dizziness, speech difficulty, weakness and numbness.    Physical Exam Updated Vital Signs BP (!) 171/106   Pulse 93   Temp 99.1 F (37.3 C) (Oral)   Resp 18   SpO2 96%   Physical  Exam Constitutional:      Appearance: She is well-developed.  HENT:     Head: Normocephalic and atraumatic.  Eyes:     Pupils: Pupils are equal, round, and reactive to light.  Cardiovascular:     Rate and Rhythm: Normal rate and regular rhythm.     Heart sounds: Normal heart sounds.  Pulmonary:     Effort: Pulmonary effort is normal. No respiratory distress.     Breath sounds: Normal breath sounds. No wheezing or rales.  Chest:     Chest wall: No tenderness.  Abdominal:     General: Bowel sounds are normal.     Palpations: Abdomen is soft.     Tenderness: There is no abdominal tenderness. There is no guarding or rebound.  Musculoskeletal:  General: Normal range of motion.     Cervical back: Normal range of motion and neck supple.     Comments: No edema or calf tenderness  Lymphadenopathy:     Cervical: No cervical adenopathy.  Skin:    General: Skin is warm and dry.     Findings: No rash.  Neurological:     General: No focal deficit present.     Mental Status: She is alert and oriented to person, place, and time.     ED Results / Procedures / Treatments   Labs (all labs ordered are listed, but only abnormal results are displayed) Labs Reviewed  COMPREHENSIVE METABOLIC PANEL - Abnormal; Notable for the following components:      Result Value   Glucose, Bld 126 (*)    All other components within normal limits  CBC WITH DIFFERENTIAL/PLATELET - Abnormal; Notable for the following components:   RBC 5.45 (*)    HCT 46.9 (*)    All other components within normal limits  I-STAT CHEM 8, ED - Abnormal; Notable for the following components:   Glucose, Bld 127 (*)    Hemoglobin 15.6 (*)    All other components within normal limits  BRAIN NATRIURETIC PEPTIDE    EKG EKG Interpretation  Date/Time:  Wednesday July 12 2020 15:56:17 EDT Ventricular Rate:  93 PR Interval:  148 QRS Duration: 80 QT Interval:  366 QTC Calculation: 455 R Axis:   39 Text  Interpretation: Normal sinus rhythm with sinus arrhythmia Septal infarct , age undetermined Abnormal ECG slight increase in ST depression laterally Confirmed by Rolan Bucco [phone]) on 07/12/2020 4:21:33 PM   Radiology DG Chest 2 View  Result Date: 07/12/2020 CLINICAL DATA:  Shortness of breath EXAM: CHEST - 2 VIEW COMPARISON:  March 20, 2018 FINDINGS: Lungs are clear. Heart size and pulmonary vascularity are normal. No adenopathy. There is aortic atherosclerosis. No evident bone lesions. IMPRESSION: Lungs clear.  Cardiac silhouette normal. Aortic Atherosclerosis (ICD10-I70.0). Electronically Signed   By: Bretta Bang III M.D.   On: 07/12/2020 16:45    Procedures Procedures (including critical care time)  Medications Ordered in ED Medications  cloNIDine (CATAPRES) tablet 0.1 mg (0.1 mg Oral Given 07/12/20 1725)  metoprolol succinate (TOPROL-XL) 24 hr tablet 100 mg (100 mg Oral Given 07/12/20 1725)    ED Course  I have reviewed the triage vital signs and the nursing notes.  Pertinent labs & imaging results that were available during my care of the patient were reviewed by me and considered in my medical decision making (see chart for details).    MDM Rules/Calculators/A&P                          Patient presents with elevated blood pressures.  She has a headache and she had some mild shortness of breath.  She has no tachypnea or increased work of breathing.  Her chest x-ray is clear without evidence of pneumonia or pulmonary edema.  Her blood pressure has improved with a dose of clonidine in the ED.  On my recheck her blood pressure prior to discharge it was 159/74.  Her headache has almost completely resolved.  Her labs are nonconcerning.  Her BNP is normal and she does not have any other suggestions of fluid overload.  She does not have any neurologic deficits or suggestions of a stroke.  No symptoms that sound more concerning for ACS.  She was discharged home in good condition.  She was given a prescription for her blood pressure medicines and her Metformin.  She was encouraged to make a follow-up appointment with her physician within the week for recheck.  Return precautions were given. Final Clinical Impression(s) / ED Diagnoses Final diagnoses:  Hypertension, unspecified type    Rx / DC Orders ED Discharge Orders         Ordered    amLODipine (NORVASC) 10 MG tablet  Daily        07/12/20 1929    hydrochlorothiazide (HYDRODIURIL) 25 MG tablet  Daily        07/12/20 1929    metFORMIN (GLUCOPHAGE) 500 MG tablet  2 times daily with meals        07/12/20 1929    metoprolol succinate (TOPROL-XL) 100 MG 24 hr tablet  Daily        07/12/20 1929           Rolan Bucco, MD 07/12/20 1934

## 2020-07-12 NOTE — ED Triage Notes (Signed)
Pt arrived from PCP office via [facility] after pt went for check up and was hypertensive due to pt been out of BP meds. Pt was systolic 240/132 per ems on arrival. Pt complains of only headache. Pt has not taken amlodipine, metoprolol, valsartan, and HCTZ.

## 2020-07-12 NOTE — Discharge Instructions (Signed)
Take your medications as directed.  Follow-up with your primary care physician within the next week for recheck.  Return here as needed for any worsening symptoms.

## 2021-09-09 ENCOUNTER — Other Ambulatory Visit: Payer: Self-pay

## 2021-09-09 ENCOUNTER — Emergency Department (HOSPITAL_COMMUNITY)
Admission: EM | Admit: 2021-09-09 | Discharge: 2021-09-10 | Disposition: A | Discharge status: Home / Self Care | Payer: Medicare Other | Attending: Emergency Medicine | Admitting: Emergency Medicine

## 2021-09-09 DIAGNOSIS — R0981 Nasal congestion: Secondary | ICD-10-CM | POA: Diagnosis not present

## 2021-09-09 DIAGNOSIS — E119 Type 2 diabetes mellitus without complications: Secondary | ICD-10-CM | POA: Diagnosis not present

## 2021-09-09 DIAGNOSIS — I5032 Chronic diastolic (congestive) heart failure: Secondary | ICD-10-CM | POA: Diagnosis not present

## 2021-09-09 DIAGNOSIS — I110 Hypertensive heart disease with heart failure: Secondary | ICD-10-CM | POA: Diagnosis not present

## 2021-09-09 DIAGNOSIS — Z20822 Contact with and (suspected) exposure to covid-19: Secondary | ICD-10-CM | POA: Insufficient documentation

## 2021-09-09 DIAGNOSIS — Z7984 Long term (current) use of oral hypoglycemic drugs: Secondary | ICD-10-CM | POA: Diagnosis not present

## 2021-09-09 DIAGNOSIS — R000 Tachycardia, unspecified: Secondary | ICD-10-CM | POA: Insufficient documentation

## 2021-09-09 DIAGNOSIS — H9202 Otalgia, left ear: Secondary | ICD-10-CM | POA: Diagnosis not present

## 2021-09-09 DIAGNOSIS — F17210 Nicotine dependence, cigarettes, uncomplicated: Secondary | ICD-10-CM | POA: Diagnosis not present

## 2021-09-09 DIAGNOSIS — M79661 Pain in right lower leg: Secondary | ICD-10-CM | POA: Diagnosis present

## 2021-09-10 ENCOUNTER — Encounter (HOSPITAL_COMMUNITY): Payer: Self-pay

## 2021-09-10 ENCOUNTER — Emergency Department (HOSPITAL_COMMUNITY): Payer: Medicare Other

## 2021-09-10 LAB — CBC WITH DIFFERENTIAL/PLATELET
Abs Immature Granulocytes: 0.03 10*3/uL (ref 0.00–0.07)
Basophils Absolute: 0.1 10*3/uL (ref 0.0–0.1)
Basophils Relative: 1 %
Eosinophils Absolute: 0.2 10*3/uL (ref 0.0–0.5)
Eosinophils Relative: 2 %
HCT: 45.9 % (ref 36.0–46.0)
Hemoglobin: 14.6 g/dL (ref 12.0–15.0)
Immature Granulocytes: 0 %
Lymphocytes Relative: 28 %
Lymphs Abs: 2.5 10*3/uL (ref 0.7–4.0)
MCH: 27.7 pg (ref 26.0–34.0)
MCHC: 31.8 g/dL (ref 30.0–36.0)
MCV: 86.9 fL (ref 80.0–100.0)
Monocytes Absolute: 0.6 10*3/uL (ref 0.1–1.0)
Monocytes Relative: 7 %
Neutro Abs: 5.6 10*3/uL (ref 1.7–7.7)
Neutrophils Relative %: 62 %
Platelets: 195 10*3/uL (ref 150–400)
RBC: 5.28 MIL/uL — ABNORMAL HIGH (ref 3.87–5.11)
RDW: 14.2 % (ref 11.5–15.5)
WBC: 9 10*3/uL (ref 4.0–10.5)
nRBC: 0 % (ref 0.0–0.2)

## 2021-09-10 LAB — BASIC METABOLIC PANEL
Anion gap: 9 (ref 5–15)
BUN: 19 mg/dL (ref 6–20)
CO2: 23 mmol/L (ref 22–32)
Calcium: 8.9 mg/dL (ref 8.9–10.3)
Chloride: 103 mmol/L (ref 98–111)
Creatinine, Ser: 0.94 mg/dL (ref 0.44–1.00)
GFR, Estimated: >60 mL/min (ref >60)
Glucose, Bld: 184 mg/dL — ABNORMAL HIGH (ref 70–99)
Potassium: 3.4 mmol/L — ABNORMAL LOW (ref 3.5–5.1)
Sodium: 135 mmol/L (ref 135–145)

## 2021-09-10 LAB — RESP PANEL BY RT-PCR (FLU A&B, COVID) ARPGX2
Influenza A by PCR: NEGATIVE
Influenza B by PCR: NEGATIVE
SARS Coronavirus 2 by RT PCR: NEGATIVE

## 2021-09-10 MED ORDER — HYDROCODONE-ACETAMINOPHEN 5-325 MG PO TABS: 1.0000 | ORAL_TABLET | Freq: Four times a day (QID) | ORAL | 0 refills | Status: DC | PRN

## 2021-09-10 MED ORDER — HYDROCODONE-ACETAMINOPHEN 5-325 MG PO TABS
2.0000 | ORAL_TABLET | Freq: Once | ORAL | Status: AC
  Administered 2021-09-10: 2 via ORAL
  Filled 2021-09-10: qty 2

## 2021-09-10 MED ORDER — AMOXICILLIN 500 MG PO CAPS: 500.0000 mg | ORAL_CAPSULE | Freq: Three times a day (TID) | ORAL | 0 refills | Status: DC

## 2021-09-10 MED ORDER — AMLODIPINE BESYLATE 5 MG PO TABS
10.0000 mg | ORAL_TABLET | Freq: Once | ORAL | Status: AC
  Administered 2021-09-10: 10 mg via ORAL
  Filled 2021-09-10: qty 2

## 2021-09-10 NOTE — ED Provider Notes (Signed)
[facility]-EMERGENCY DEPT Provider Note   CSN: [id number] Arrival date & time: 09/09/21  2346     History Chief Complaint  Patient presents with   Nasal Congestion   Leg Pain    Debbie Davis is a 60 y.o. female.  The history is provided by the patient.  Leg Pain Location:  Leg Time since incident:  6 months Pain details:    Quality:  Aching   Severity:  Moderate   Onset quality:  Gradual   Timing:  Constant   Progression:  Worsening Chronicity:  Chronic Relieved by:  Nothing Worsened by:  Activity Associated symptoms: no fever   Patient presents for multiple complaints  #1 she reports nasal congestion which is causing her to have difficulty breathing.  She also reports that she feels that she has fluid in her ears and muffled hearing.  She also reports cough.  2.Patient also reports headache with nasal congestion.  #3 she also reports right leg pain for several months.  She reports it hurts worse when she is walking or going up steps.  Most of the pain is from her right knee to her foot.  No recent trauma or falls.  She reports both of her feet burning type pain daily due to diabetes   #4 patient also reports brief chest pain with her coughing  Past Medical History:  Diagnosis Date   Atherosclerosis of aorta (HCC) 11/25/2016   Chronic diastolic heart failure (HCC) 11/25/2016   Diabetes type 2, uncontrolled 08/22/2016   Hypertension    Hypertensive heart disease 08/20/2016   Obesity (BMI 30-39.9) 11/25/2016    Patient Active Problem List   Diagnosis Date Noted   Progressive angina (HCC) 11/25/2016   Atherosclerosis of aorta (HCC) 11/25/2016   Chronic diastolic heart failure (HCC) 11/25/2016   Obesity (BMI 30-39.9) 11/25/2016   Dyspnea 11/25/2016   Diabetes type 2, uncontrolled 08/22/2016   Hypertensive heart disease 08/20/2016    Past Surgical History:  Procedure Laterality Date   KIDNEY SURGERY Right    removal of kidney stone    RIGHT/LEFT HEART CATH AND CORONARY ANGIOGRAPHY N/A 11/26/2016   Procedure: Right/Left Heart Cath and Coronary Angiography;  Surgeon: Marykay Lex, MD;  Location: [facility] INVASIVE CV LAB;  Service: Cardiovascular;  Laterality: N/A;   TUBAL LIGATION       OB History   No obstetric history on file.     Family History  Problem Relation Age of Onset   Cancer Mother    Heart disease Father    Diabetes Sister    Diabetes Brother    Diabetes Sister    Diabetes Sister    Diabetes Sister     Social History   Tobacco Use   Smoking status: Every Day    Packs/day: 0.50    Years: 30.00    Pack years: 15.00    Types: Cigarettes   Smokeless tobacco: Never  Substance Use Topics   Alcohol use: Yes    Comment: special occasions    Drug use: No    Home Medications Prior to Admission medications   Medication Sig Start Date End Date Taking? Authorizing Provider  albuterol (PROVENTIL HFA;VENTOLIN HFA) 108 (90 Base) MCG/ACT inhaler Inhale 1-2 puffs into the lungs every 4 (four) hours as needed for wheezing or shortness of breath. 08/12/16   Cheri Fowler, PA-C  amLODipine (NORVASC) 10 MG tablet Take 1 tablet (10 mg total) by mouth daily. 07/12/20   Rolan Bucco, MD  hydrochlorothiazide (  HYDRODIURIL) 25 MG tablet Take 1 tablet (25 mg total) by mouth daily. 07/12/20   Rolan Bucco, MD  HYDROcodone-acetaminophen (NORCO/VICODIN) 5-325 MG tablet Take 1 tablet by mouth every 4 (four) hours as needed. Patient taking differently: Take 1 tablet by mouth every 4 (four) hours as needed for severe pain.  02/17/20   Garlon Hatchet, PA-C  lidocaine (LIDODERM) 5 % Place 1 patch onto the skin daily. Remove & Discard patch within 12 hours or as directed by MD 03/25/19   Bill Salinas, PA-C  metFORMIN (GLUCOPHAGE) 500 MG tablet Take 1 tablet (500 mg total) by mouth 2 (two) times daily with a meal. 07/12/20   Rolan Bucco, MD  metoprolol succinate (TOPROL-XL) 100 MG 24 hr tablet Take 1 tablet (100 mg total)  by mouth daily. Take with or immediately following a meal 07/12/20   Rolan Bucco, MD  Multiple Vitamin (MULTIVITAMIN WITH MINERALS) TABS tablet Take 1 tablet by mouth daily.     [provider]  naproxen sodium (ANAPROX) 220 MG tablet Take 220 mg by mouth 2 (two) times daily as needed (for pain).     [provider]  sulfamethoxazole-trimethoprim (BACTRIM DS) 800-160 MG tablet Take 2 tablets by mouth 2 (two) times daily. Patient not taking: Reported on 03/20/2018 10/25/17   Pisciotta, Joni Reining, PA-C  umeclidinium-vilanterol (ANORO ELLIPTA) 62.5-25 MCG/INH AEPB Inhale 1 puff into the lungs daily as needed (shortness of breath).     [provider]    Allergies    Codeine and Morphine and related  Review of Systems   Review of Systems  Constitutional:  Negative for fever.  Respiratory:  Positive for cough.   Cardiovascular:  Positive for chest pain.  Gastrointestinal:  Negative for vomiting.  Musculoskeletal:  Positive for arthralgias.  Neurological:  Positive for headaches. Negative for weakness.  All other systems reviewed and are negative.  Physical Exam Updated Vital Signs BP (!) 198/130 (BP Location: Right Arm)   Pulse (!) 112   Temp 98.3 F (36.8 C) (Oral)   Resp 20   Ht 1.524 m (5')   Wt 86.2 kg   SpO2 100%   BMI 37.11 kg/m   Physical Exam CONSTITUTIONAL: Well developed/well nourished, anxious HEAD: Normocephalic/atraumatic EYES: EOMI/PERRL ENMT: Mucous membranes moist, no facial swelling, no facial tenderness Right TM clear and intact.  Left TM without erythema but does appear to be bulging NECK: supple no meningeal signs SPINE/BACK:entire spine nontender CV: S1/S2 noted, no murmurs/rubs/gallops noted LUNGS: Lungs are clear to auscultation bilaterally, no apparent distress ABDOMEN: soft, nontender, no rebound or guarding, bowel sounds noted throughout abdomen GU:no cva tenderness NEURO: Pt is awake/alert/appropriate, moves all  extremitiesx4.  No facial droop.  No arm or leg drift EXTREMITIES: Mild tenderness noted to the right knee and right calf.  There is no edema.  No erythema.  Full range of motion of all joints in her legs.  No joint effusions  left distal pulses are palpable.  No palpable pulses in the right foot.  Doppler pulses are noted in right foot SKIN: warm, color normal PSYCH: Anxious and tearful  ED Results / Procedures / Treatments   Labs (all labs ordered are listed, but only abnormal results are displayed) Labs Reviewed  BASIC METABOLIC PANEL - Abnormal; Notable for the following components:      Result Value   Potassium 3.4 (*)    Glucose, Bld 184 (*)    All other components within normal limits  CBC WITH DIFFERENTIAL/PLATELET -  Abnormal; Notable for the following components:   RBC 5.28 (*)    All other components within normal limits  RESP PANEL BY RT-PCR (FLU A&B, COVID) [id number]    EKG EKG Interpretation  Date/Time:  Monday September 10 2021 00:01:53 EST Ventricular Rate:  113 PR Interval:  132 QRS Duration: 81 QT Interval:  339 QTC Calculation: 465 R Axis:   -21 Text Interpretation: Sinus tachycardia Left ventricular hypertrophy Anterior Q waves, possibly due to LVH Confirmed by Zadie Rhine ([id number]) on 09/10/2021 12:17:05 AM  Radiology DG Chest 2 View  Result Date: 09/10/2021 CLINICAL DATA:  Cough. EXAM: CHEST - 2 VIEW COMPARISON:  Chest radiograph dated 07/12/2020. FINDINGS: No focal consolidation, pleural effusion or pneumothorax. The cardiac silhouette is within limits. Atherosclerotic calcification of the aortic arch. No acute osseous pathology. IMPRESSION: No active cardiopulmonary disease. Electronically Signed   By: Elgie Collard M.D.   On: 09/10/2021 01:30    Procedures Procedures   Medications Ordered in ED Medications  HYDROcodone-acetaminophen (NORCO/VICODIN) 5-325 MG per tablet 2 tablet (2 tablets Oral Given 09/10/21 0143)  amLODipine (NORVASC) tablet 10  mg (10 mg Oral Given 09/10/21 0226)    ED Course  I have reviewed the triage vital signs and the nursing notes.  Pertinent labs & imaging results that were available during my care of the patient were reviewed by me and considered in my medical decision making (see chart for details).    MDM Rules/Calculators/A&P                           Patient presents for multiple complaints.  She reports cough and congestion, COVID testing is pending.  Patient also reports right leg pain for several months that appears to be worse with walking.  She denies any trauma.  No edema noted to suggest DVT However pulses were only found by Doppler on the right foot.  Patient is a smoker and diabetic, this is likely peripheral vascular disease.  However this does not appear to be an acute arterial occlusion.  She will be referred to vascular 3:25 AM Patient feels improved.  Blood pressure improved.  She is now on 4 antihypertensives and reports med compliance.  The elevation in BP tonight may been due to pain She is in no acute distress.  No focal weakness to suggest stroke.  I have low suspicion for acute coronary syndrome at this time.  Low suspicion for aortic dissection as cause of her symptoms  For her ear pain and congestion, will place her on antibiotics  For her right leg pain, strong suspicion this is PVD.  Instructed her to stop smoking.  She has been referred to vascular surgery. Patient agrees with plan Final Clinical Impression(s) / ED Diagnoses Final diagnoses:  Right calf pain  Nasal congestion  Left ear pain    Rx / DC Orders ED Discharge Orders          Ordered    amoxicillin (AMOXIL) 500 MG capsule  3 times daily        09/10/21 0320    HYDROcodone-acetaminophen (NORCO/VICODIN) 5-325 MG tablet  Every 6 hours PRN        09/10/21 0320    Ambulatory referral to Vascular Surgery       Comments: Suspect peripheral vascular disease   09/10/21 0320             Zadie Rhine, MD 09/10/21 [phone]

## 2021-09-10 NOTE — ED Triage Notes (Signed)
Pt reports with congestion and not feeling well x 2 weeks. Pt states that she has right leg pain x 1 year.

## 2021-09-24 ENCOUNTER — Other Ambulatory Visit: Payer: Self-pay

## 2021-09-24 ENCOUNTER — Encounter (HOSPITAL_COMMUNITY): Payer: Self-pay

## 2021-09-24 ENCOUNTER — Emergency Department (HOSPITAL_COMMUNITY)
Admission: EM | Admit: 2021-09-24 | Discharge: 2021-09-24 | Disposition: A | Discharge status: Home / Self Care | Payer: Medicare Other | Attending: Emergency Medicine | Admitting: Emergency Medicine

## 2021-09-24 ENCOUNTER — Emergency Department (HOSPITAL_COMMUNITY): Payer: Medicare Other

## 2021-09-24 ENCOUNTER — Emergency Department (HOSPITAL_COMMUNITY)
Admission: EM | Admit: 2021-09-24 | Discharge: 2021-09-25 | Disposition: A | Discharge status: Home / Self Care | Payer: Medicare Other | Source: Home / Self Care | Attending: Emergency Medicine | Admitting: Emergency Medicine

## 2021-09-24 DIAGNOSIS — J069 Acute upper respiratory infection, unspecified: Secondary | ICD-10-CM | POA: Insufficient documentation

## 2021-09-24 DIAGNOSIS — I5032 Chronic diastolic (congestive) heart failure: Secondary | ICD-10-CM | POA: Insufficient documentation

## 2021-09-24 DIAGNOSIS — E119 Type 2 diabetes mellitus without complications: Secondary | ICD-10-CM | POA: Insufficient documentation

## 2021-09-24 DIAGNOSIS — I110 Hypertensive heart disease with heart failure: Secondary | ICD-10-CM | POA: Insufficient documentation

## 2021-09-24 DIAGNOSIS — J3489 Other specified disorders of nose and nasal sinuses: Secondary | ICD-10-CM | POA: Diagnosis not present

## 2021-09-24 DIAGNOSIS — R059 Cough, unspecified: Secondary | ICD-10-CM | POA: Insufficient documentation

## 2021-09-24 DIAGNOSIS — Z79899 Other long term (current) drug therapy: Secondary | ICD-10-CM | POA: Diagnosis not present

## 2021-09-24 DIAGNOSIS — Z20822 Contact with and (suspected) exposure to covid-19: Secondary | ICD-10-CM | POA: Insufficient documentation

## 2021-09-24 DIAGNOSIS — Z7984 Long term (current) use of oral hypoglycemic drugs: Secondary | ICD-10-CM | POA: Insufficient documentation

## 2021-09-24 DIAGNOSIS — R509 Fever, unspecified: Secondary | ICD-10-CM | POA: Diagnosis present

## 2021-09-24 DIAGNOSIS — F17210 Nicotine dependence, cigarettes, uncomplicated: Secondary | ICD-10-CM | POA: Insufficient documentation

## 2021-09-24 LAB — RESP PANEL BY RT-PCR (FLU A&B, COVID) ARPGX2
Influenza A by PCR: NEGATIVE
Influenza B by PCR: NEGATIVE
SARS Coronavirus 2 by RT PCR: NEGATIVE

## 2021-09-24 MED ORDER — ALBUTEROL SULFATE HFA 108 (90 BASE) MCG/ACT IN AERS
2.0000 | INHALATION_SPRAY | Freq: Once | RESPIRATORY_TRACT | Status: AC
  Administered 2021-09-24: 15:00:00 2 via RESPIRATORY_TRACT
  Filled 2021-09-24: qty 6.7

## 2021-09-24 MED ORDER — BENZONATATE 100 MG PO CAPS
100.0000 mg | ORAL_CAPSULE | Freq: Once | ORAL | Status: AC
  Administered 2021-09-24: 15:00:00 100 mg via ORAL
  Filled 2021-09-24: qty 1

## 2021-09-24 NOTE — ED Triage Notes (Signed)
Patient reports a productive cough with a small amount of yellow sputum,nasal congestion, fever x 4 days. Patient states she has not been taking her BP meds for the past  2 days because she "coughs so much." BP in triage 170/112.

## 2021-09-24 NOTE — ED Provider Notes (Signed)
Emergency Medicine Provider Triage Evaluation Note  Debbie Davis , a 60 y.o. female  was evaluated in triage.  Pt complains of fever, cough, nasal congestion, and generalized body aches.  Symptoms have been present over the last 4 days.  States that cough is producing small amount of yellow mucus.  Patient reports that due to her cough she has not been able to take her blood pressure medication over the last 2 days.  Denies any shortness of breath, headache, visual disturbance, or chest pain at this time.  Review of Systems  Positive: Fever, cough, nasal congestion, generalized body ache Negative: Headache, visual disturbance, chest pain, shortness of breath  Physical Exam  BP (!) 170/112    Pulse 98    Temp 98.8 F (37.1 C) (Oral)    Resp 18    Ht 5' (1.524 m)    Wt 78.5 kg    SpO2 98%    BMI 33.79 kg/m  Gen:   Awake, no distress   Resp:  Normal effort, expiratory wheezing noted to all lung fields MSK:   Moves extremities without difficulty; no swelling or tenderness to bilateral lower extremities Other:    Medical Decision Making  Medically screening exam initiated at 2:37 PM.  Appropriate orders placed.  MARESA MORASH was informed that the remainder of the evaluation will be completed by another provider, this initial triage assessment does not replace that evaluation, and the importance of remaining in the ED until their evaluation is complete.     Haskel Schroeder, PA-C 09/24/21 1438    Rozelle Logan, DO 09/25/21 1636

## 2021-09-24 NOTE — ED Triage Notes (Signed)
Patient reports a productive cough with a small amount of yellow sputum,nasal congestion, fever x 4 days. Patient states she has not been taking her BP medications. Pt is now complaining of chest pain.

## 2021-09-25 ENCOUNTER — Emergency Department (HOSPITAL_COMMUNITY): Payer: Medicare Other

## 2021-09-25 LAB — CBC WITH DIFFERENTIAL/PLATELET
Abs Immature Granulocytes: 0 10*3/uL (ref 0.00–0.07)
Basophils Absolute: 0 10*3/uL (ref 0.0–0.1)
Basophils Relative: 1 %
Eosinophils Absolute: 0.1 10*3/uL (ref 0.0–0.5)
Eosinophils Relative: 2 %
HCT: 46.7 % — ABNORMAL HIGH (ref 36.0–46.0)
Hemoglobin: 14.5 g/dL (ref 12.0–15.0)
Immature Granulocytes: 0 %
Lymphocytes Relative: 39 %
Lymphs Abs: 1.7 10*3/uL (ref 0.7–4.0)
MCH: 27.3 pg (ref 26.0–34.0)
MCHC: 31 g/dL (ref 30.0–36.0)
MCV: 87.8 fL (ref 80.0–100.0)
Monocytes Absolute: 0.6 10*3/uL (ref 0.1–1.0)
Monocytes Relative: 14 %
Neutro Abs: 1.9 10*3/uL (ref 1.7–7.7)
Neutrophils Relative %: 44 %
Platelets: 165 10*3/uL (ref 150–400)
RBC: 5.32 MIL/uL — ABNORMAL HIGH (ref 3.87–5.11)
RDW: 14.4 % (ref 11.5–15.5)
WBC: 4.4 10*3/uL (ref 4.0–10.5)
nRBC: 0 % (ref 0.0–0.2)

## 2021-09-25 LAB — BASIC METABOLIC PANEL
Anion gap: 10 (ref 5–15)
BUN: 16 mg/dL (ref 6–20)
CO2: 24 mmol/L (ref 22–32)
Calcium: 9.1 mg/dL (ref 8.9–10.3)
Chloride: 104 mmol/L (ref 98–111)
Creatinine, Ser: 0.8 mg/dL (ref 0.44–1.00)
GFR, Estimated: >60 mL/min (ref >60)
Glucose, Bld: 186 mg/dL — ABNORMAL HIGH (ref 70–99)
Potassium: 3.5 mmol/L (ref 3.5–5.1)
Sodium: 138 mmol/L (ref 135–145)

## 2021-09-25 LAB — RESP PANEL BY RT-PCR (FLU A&B, COVID) ARPGX2
Influenza A by PCR: NEGATIVE
Influenza B by PCR: NEGATIVE
SARS Coronavirus 2 by RT PCR: NEGATIVE

## 2021-09-25 LAB — BRAIN NATRIURETIC PEPTIDE: B Natriuretic Peptide: 23.3 pg/mL (ref 0.0–100.0)

## 2021-09-25 LAB — CBG MONITORING, ED: Glucose-Capillary: 176 mg/dL — ABNORMAL HIGH (ref 70–99)

## 2021-09-25 MED ORDER — METOPROLOL TARTRATE 25 MG PO TABS
25.0000 mg | ORAL_TABLET | Freq: Once | ORAL | Status: AC
  Administered 2021-09-25: 06:00:00 25 mg via ORAL
  Filled 2021-09-25: qty 1

## 2021-09-25 MED ORDER — DOXYCYCLINE HYCLATE 100 MG PO TABS
100.0000 mg | ORAL_TABLET | Freq: Once | ORAL | Status: AC
  Administered 2021-09-25: 06:00:00 100 mg via ORAL
  Filled 2021-09-25: qty 1

## 2021-09-25 MED ORDER — METHYLPREDNISOLONE SODIUM SUCC 125 MG IJ SOLR
125.0000 mg | Freq: Once | INTRAMUSCULAR | Status: AC
  Administered 2021-09-25: 06:00:00 125 mg via INTRAMUSCULAR
  Filled 2021-09-25: qty 2

## 2021-09-25 MED ORDER — SALINE SPRAY 0.65 % NA SOLN: 1.0000 | NASAL | 0 refills | Status: DC | PRN

## 2021-09-25 MED ORDER — DOXYCYCLINE HYCLATE 100 MG PO CAPS: 100.0000 mg | ORAL_CAPSULE | Freq: Two times a day (BID) | ORAL | 0 refills | Status: DC

## 2021-09-25 MED ORDER — SALINE SPRAY 0.65 % NA SOLN
1.0000 | Freq: Once | NASAL | Status: AC
  Administered 2021-09-25: 06:00:00 1 via NASAL
  Filled 2021-09-25: qty 44

## 2021-09-25 MED ORDER — FLUTICASONE PROPIONATE 50 MCG/ACT NA SUSP: 2.0000 | Freq: Every day | NASAL | 0 refills | Status: DC

## 2021-09-25 NOTE — ED Notes (Signed)
D/w Dr. Anitra Lauth pt's BP of 225/120 manually at d/c.  Per Dr. Anitra Lauth since pt is asymptomatic and has BP medications at home d/c can continue.  Pt informed to take BP medications at home as well as monitor her BP.  Pt verbalized understanding.

## 2021-09-25 NOTE — ED Provider Notes (Signed)
[facility]-EMERGENCY DEPT Provider Note   CSN: [id number] Arrival date & time: 09/24/21  2252     History No chief complaint on file.   Debbie Davis is a 60 y.o. female.   URI Presenting symptoms: congestion, cough, fatigue, rhinorrhea and sore throat   Severity:  Moderate Duration:  5 days Timing:  Sporadic Progression:  Waxing and waning Chronicity:  New Ineffective treatments:  Prescription medications Associated symptoms: arthralgias, myalgias, sinus pain, sneezing and wheezing       Past Medical History:  Diagnosis Date   Atherosclerosis of aorta (HCC) 11/25/2016   Chronic diastolic heart failure (HCC) 11/25/2016   Diabetes type 2, uncontrolled 08/22/2016   Hypertension    Hypertensive heart disease 08/20/2016   Obesity (BMI 30-39.9) 11/25/2016    Patient Active Problem List   Diagnosis Date Noted   Progressive angina (HCC) 11/25/2016   Atherosclerosis of aorta (HCC) 11/25/2016   Chronic diastolic heart failure (HCC) 11/25/2016   Obesity (BMI 30-39.9) 11/25/2016   Dyspnea 11/25/2016   Diabetes type 2, uncontrolled 08/22/2016   Hypertensive heart disease 08/20/2016    Past Surgical History:  Procedure Laterality Date   KIDNEY SURGERY Right    removal of kidney stone   RIGHT/LEFT HEART CATH AND CORONARY ANGIOGRAPHY N/A 11/26/2016   Procedure: Right/Left Heart Cath and Coronary Angiography;  Surgeon: Marykay Lex, MD;  Location: [facility] INVASIVE CV LAB;  Service: Cardiovascular;  Laterality: N/A;   TUBAL LIGATION       OB History   No obstetric history on file.     Family History  Problem Relation Age of Onset   Cancer Mother    Heart disease Father    Diabetes Sister    Diabetes Brother    Diabetes Sister    Diabetes Sister    Diabetes Sister     Social History   Tobacco Use   Smoking status: Every Day    Packs/day: 0.50    Years: 30.00    Pack years: 15.00    Types: Cigarettes   Smokeless tobacco: Never  Vaping  Use   Vaping Use: Never used  Substance Use Topics   Alcohol use: Yes   Drug use: No    Home Medications Prior to Admission medications   Medication Sig Start Date End Date Taking? Authorizing Provider  doxycycline (VIBRAMYCIN) 100 MG capsule Take 1 capsule (100 mg total) by mouth 2 (two) times daily. One po bid x 7 days 09/25/21  Yes Alek Poncedeleon, Barbara Cower, MD  fluticasone (FLONASE) 50 MCG/ACT nasal spray Place 2 sprays into both nostrils daily. 09/25/21  Yes Shawnte Winton, Barbara Cower, MD  sodium chloride (OCEAN) 0.65 % SOLN nasal spray Place 1 spray into both nostrils as needed for congestion. 09/25/21  Yes Armandina Iman, Barbara Cower, MD  albuterol (PROVENTIL HFA;VENTOLIN HFA) 108 (90 Base) MCG/ACT inhaler Inhale 1-2 puffs into the lungs every 4 (four) hours as needed for wheezing or shortness of breath. 08/12/16   Cheri Fowler, PA-C  amLODipine (NORVASC) 10 MG tablet Take 1 tablet (10 mg total) by mouth daily. 07/12/20   Rolan Bucco, MD  amoxicillin (AMOXIL) 500 MG capsule Take 1 capsule (500 mg total) by mouth 3 (three) times daily. 09/10/21   Zadie Rhine, MD  hydrochlorothiazide (HYDRODIURIL) 25 MG tablet Take 1 tablet (25 mg total) by mouth daily. 07/12/20   Rolan Bucco, MD  HYDROcodone-acetaminophen (NORCO/VICODIN) 5-325 MG tablet Take 1 tablet by mouth every 6 (six) hours as needed for severe pain. 09/10/21   Wickline,  Dorinda Hill, MD  lidocaine (LIDODERM) 5 % Place 1 patch onto the skin daily. Remove & Discard patch within 12 hours or as directed by MD 03/25/19   Bill Salinas, PA-C  metFORMIN (GLUCOPHAGE) 500 MG tablet Take 1 tablet (500 mg total) by mouth 2 (two) times daily with a meal. 07/12/20   Rolan Bucco, MD  metoprolol succinate (TOPROL-XL) 100 MG 24 hr tablet Take 1 tablet (100 mg total) by mouth daily. Take with or immediately following a meal 07/12/20   Rolan Bucco, MD  Multiple Vitamin (MULTIVITAMIN WITH MINERALS) TABS tablet Take 1 tablet by mouth daily.     [provider]   naproxen sodium (ANAPROX) 220 MG tablet Take 220 mg by mouth 2 (two) times daily as needed (for pain).     [provider]  sulfamethoxazole-trimethoprim (BACTRIM DS) 800-160 MG tablet Take 2 tablets by mouth 2 (two) times daily. Patient not taking: Reported on 03/20/2018 10/25/17   Pisciotta, Joni Reining, PA-C  umeclidinium-vilanterol (ANORO ELLIPTA) 62.5-25 MCG/INH AEPB Inhale 1 puff into the lungs daily as needed (shortness of breath).     [provider]    Allergies    Codeine and Morphine and related  Review of Systems   Review of Systems  Constitutional:  Positive for fatigue.  HENT:  Positive for congestion, rhinorrhea, sinus pain, sneezing and sore throat.   Respiratory:  Positive for cough, shortness of breath and wheezing.   Musculoskeletal:  Positive for arthralgias and myalgias.  All other systems reviewed and are negative.  Physical Exam Updated Vital Signs BP (!) 225/120 (BP Location: Right Arm) Comment: Will inform MD prior to d/c   Pulse 91    Temp 98.8 F (37.1 C) (Oral)    Resp (!) 22    Ht 5' (1.524 m)    Wt 78.5 kg    SpO2 100%    BMI 33.79 kg/m   Physical Exam Vitals and nursing note reviewed.  Constitutional:      Appearance: She is well-developed.  HENT:     Head: Normocephalic and atraumatic.     Nose: Congestion and rhinorrhea present.     Mouth/Throat:     Mouth: Mucous membranes are dry.     Pharynx: Posterior oropharyngeal erythema present.  Eyes:     Extraocular Movements: Extraocular movements intact.     Pupils: Pupils are equal, round, and reactive to light.  Cardiovascular:     Rate and Rhythm: Normal rate and regular rhythm.  Pulmonary:     Effort: No respiratory distress.     Breath sounds: No stridor.  Abdominal:     General: Abdomen is flat. There is no distension.  Musculoskeletal:        General: No swelling or tenderness. Normal range of motion.     Cervical back: Normal range of motion.  Skin:    General: Skin is  warm and dry.  Neurological:     General: No focal deficit present.     Mental Status: She is alert.     Sensory: Sensory deficit present.    ED Results / Procedures / Treatments   Labs (all labs ordered are listed, but only abnormal results are displayed) Labs Reviewed  CBC WITH DIFFERENTIAL/PLATELET - Abnormal; Notable for the following components:      Result Value   RBC 5.32 (*)    HCT 46.7 (*)    All other components within normal limits  BASIC METABOLIC PANEL - Abnormal; Notable for the following components:  Glucose, Bld 186 (*)    All other components within normal limits  CBG MONITORING, ED - Abnormal; Notable for the following components:   Glucose-Capillary 176 (*)    All other components within normal limits  RESP PANEL BY RT-PCR (FLU A&B, COVID) ARPGX2  BRAIN NATRIURETIC PEPTIDE    EKG EKG Interpretation  Date/Time:  Monday September 24 2021 23:26:12 EST Ventricular Rate:  103 PR Interval:  140 QRS Duration: 82 QT Interval:  345 QTC Calculation: 452 R Axis:   45 Text Interpretation: Sinus tachycardia Probable left atrial enlargement Anteroseptal infarct, old Confirmed by Marily Memos [phone]) on 09/25/2021 5:39:15 AM  Radiology DG Chest 2 View  Result Date: 09/25/2021 CLINICAL DATA:  Productive cough with fever and congestion for 4 days. EXAM: CHEST - 2 VIEW COMPARISON:  September 24, 2021 FINDINGS: The heart size and mediastinal contours are within normal limits. There is moderate severity calcification of the aortic arch. Both lungs are clear. Mild, stable scoliosis of the thoracic spine is noted. IMPRESSION: Stable exam without active cardiopulmonary disease. Electronically Signed   By: Aram Candela M.D.   On: 09/25/2021 02:07   DG Chest 2 View  Result Date: 09/24/2021 CLINICAL DATA:  Cough EXAM: CHEST - 2 VIEW COMPARISON:  09/10/2021 FINDINGS: Cardiac size is within normal limits. There are no signs of pulmonary edema or new focal infiltrates. There  is no pleural effusion or pneumothorax. IMPRESSION: No active cardiopulmonary disease. Electronically Signed   By: Ernie Avena M.D.   On: 09/24/2021 15:51    Procedures Procedures   Medications Ordered in ED Medications  metoprolol tartrate (LOPRESSOR) tablet 25 mg (25 mg Oral Given 09/25/21 0623)  sodium chloride (OCEAN) 0.65 % nasal spray 1 spray (1 spray Each Nare Given 09/25/21 0624)  doxycycline (VIBRA-TABS) tablet 100 mg (100 mg Oral Given 09/25/21 9147)  methylPREDNISolone sodium succinate (SOLU-MEDROL) 125 mg/2 mL injection 125 mg (125 mg Intramuscular Given 09/25/21 8295)    ED Course  I have reviewed the triage vital signs and the nursing notes.  Pertinent labs & imaging results that were available during my care of the patient were reviewed by me and considered in my medical decision making (see chart for details).    MDM Rules/Calculators/A&P                         Overall patient's symptoms seem to be consistent with upper respiratory infection.  Been going on for couple weeks now total.  We will start doxycycline.  She is very tried breathing treatments which seem to help some she can continue those at home.  Also suggested some nasal rinses and years nasal spray here.  She also try Flonase if the nasal rinses do not seem to work.  Her blood pressure was elevated here she has not been taking her blood pressure medications at home.  Because of this I did check a chest x-ray and BNP to ensure there is no evidence of pulmonary edema ectopy causing her symptoms however these were both very reassuring.  Overall patient's vital signs are otherwise reassuring.  We will continue to give blood pressure medications at home and follow-up with PCP in a few days for recheck.  Final Clinical Impression(s) / ED Diagnoses Final diagnoses:  Upper respiratory tract infection, unspecified type    Rx / DC Orders ED Discharge Orders          Ordered    doxycycline (VIBRAMYCIN) 100  MG capsule  2 times daily        09/25/21 0757    fluticasone (FLONASE) 50 MCG/ACT nasal spray  Daily        09/25/21 0757    sodium chloride (OCEAN) 0.65 % SOLN nasal spray  As needed        09/25/21 0757             Madeliene Tejera, Barbara Cower, MD 09/25/21 2359

## 2021-10-03 ENCOUNTER — Other Ambulatory Visit: Payer: Self-pay

## 2021-10-03 DIAGNOSIS — I739 Peripheral vascular disease, unspecified: Secondary | ICD-10-CM

## 2021-10-15 NOTE — Progress Notes (Deleted)
[facility] [place]  ASSESSMENT / PLAN: Debbie Davis is a 61 y.o. female with atherosclerosis of *** native arteries of *** causing {Chronic PAD levels:25303}.  Patient counseled {pad risk2:26283}  WIfI score calculated based on clinical exam and non-invasive measurements. {WIFIvascular:26096}  Recommend the following which can slow the progression of atherosclerosis and reduce the risk of major adverse cardiac / limb events:  Complete cessation from all tobacco products. Blood glucose control with goal A1c < 7%. Blood pressure control with goal blood pressure < 140/90 mmHg. Lipid reduction therapy with goal LDL-C <100 mg/dL (<70 if symptomatic from PAD).  Aspirin 81mg  PO QD.  *** Clopidogrel 75mg  PO QD. *** Rivaroxaban 2.5mg  PO BID. *** Cilostozal 100mg  PO BID for intermittent claudication without evidence of heart failure. Atorvastatin 40-80mg  PO QD (or other "high intensity" statin therapy). *** Daily walking to and past the point of discomfort. Patient counseled to keep a log of exercise distance. *** Adequate hydration (at least 2 liters / day) if patient's heart and kidney function is adequate.  Plan *** lower extremity angiogram with possible intervention via *** approach in cath lab ***.    CHIEF COMPLAINT: ***  HISTORY OF PRESENT ILLNESS: Debbie Davis is a 61 y.o. female ***  VASCULAR SURGICAL HISTORY: ***  VASCULAR RISK FACTORS: {FINDINGS; POSITIVE [phone]} history of stroke / transient ischemic attack. {FINDINGS; POSITIVE [phone]} history of coronary artery disease. *** history of PCI. *** history of CABG.  {FINDINGS; POSITIVE [phone]} history of diabetes mellitus. Last A1c ***. {FINDINGS; POSITIVE [phone]} history of smoking. *** actively smoking. {FINDINGS; POSITIVE [phone]} history of hypertension. *** drug regimen with *** control. {FINDINGS; POSITIVE [phone]}  history of chronic kidney disease.  Last GFR ***. CKD {stage:30421363}. {FINDINGS; POSITIVE [phone]} history of chronic obstructive pulmonary disease, treated with ***.  FUNCTIONAL STATUS: ECOG performance status: {findings; ecog performance status:31780} Ambulatory status: {TNHAmbulation:25868}  Past Medical History:  Diagnosis Date   Atherosclerosis of aorta ([place]) 11/25/2016   Chronic diastolic heart failure ([place]) 11/25/2016   Diabetes type 2, uncontrolled 08/22/2016   Hypertension    Hypertensive heart disease 08/20/2016   Obesity (BMI 30-39.9) 11/25/2016    Past Surgical History:  Procedure Laterality Date   KIDNEY SURGERY Right    removal of kidney stone   RIGHT/LEFT HEART CATH AND CORONARY ANGIOGRAPHY N/A 11/26/2016   Procedure: Right/Left Heart Cath and Coronary Angiography;  Surgeon: Leonie Man, MD;  Location: [facility];  Service: Cardiovascular;  Laterality: N/A;   TUBAL LIGATION      Family History  Problem Relation Age of Onset   Cancer Mother    Heart disease Father    Diabetes Sister    Diabetes Brother    Diabetes Sister    Diabetes Sister    Diabetes Sister     Social History   Socioeconomic History   Marital status: Single    Spouse name: Not on file   Number of children: Not on file   Years of education: Not on file   Highest education level: Not on file  Occupational History   Not on file  Tobacco Use   Smoking status: Every Day    Packs/day: 0.50    Years: 30.00    Pack years: 15.00    Types: Cigarettes   Smokeless tobacco: Never  Vaping Use   Vaping Use: Never used  Substance and Sexual Activity   Alcohol use: Yes   Drug use: No   Sexual activity: Not  on file  Other Topics Concern   Not on file  Social History Narrative   Not on file   Social Determinants of Health   Financial Resource Strain: Not on file  Food Insecurity: Not on file  Transportation Needs: Not on file  Physical Activity: Not on file   Stress: Not on file  Social Connections: Not on file  Intimate Partner Violence: Not on file    Allergies  Allergen Reactions   Codeine Rash   Morphine And Related Rash    Current Outpatient Medications  Medication Sig Dispense Refill   albuterol (PROVENTIL HFA;VENTOLIN HFA) 108 (90 Base) MCG/ACT inhaler Inhale 1-2 puffs into the lungs every 4 (four) hours as needed for wheezing or shortness of breath. 1 Inhaler 0   amLODipine (NORVASC) 10 MG tablet Take 1 tablet (10 mg total) by mouth daily. 30 tablet 1   amoxicillin (AMOXIL) 500 MG capsule Take 1 capsule (500 mg total) by mouth 3 (three) times daily. 21 capsule 0   doxycycline (VIBRAMYCIN) 100 MG capsule Take 1 capsule (100 mg total) by mouth 2 (two) times daily. One po bid x 7 days 14 capsule 0   fluticasone (FLONASE) 50 MCG/ACT nasal spray Place 2 sprays into both nostrils daily. 16 g 0   hydrochlorothiazide (HYDRODIURIL) 25 MG tablet Take 1 tablet (25 mg total) by mouth daily. 30 tablet 1   HYDROcodone-acetaminophen (NORCO/VICODIN) 5-325 MG tablet Take 1 tablet by mouth every 6 (six) hours as needed for severe pain. 5 tablet 0   lidocaine (LIDODERM) 5 % Place 1 patch onto the skin daily. Remove & Discard patch within 12 hours or as directed by MD 30 patch 0   metFORMIN (GLUCOPHAGE) 500 MG tablet Take 1 tablet (500 mg total) by mouth 2 (two) times daily with a meal. 60 tablet 1   metoprolol succinate (TOPROL-XL) 100 MG 24 hr tablet Take 1 tablet (100 mg total) by mouth daily. Take with or immediately following a meal 30 tablet 1   Multiple Vitamin (MULTIVITAMIN WITH MINERALS) TABS tablet Take 1 tablet by mouth daily.      naproxen sodium (ANAPROX) 220 MG tablet Take 220 mg by mouth 2 (two) times daily as needed (for pain).      sodium chloride (OCEAN) 0.65 % SOLN nasal spray Place 1 spray into both nostrils as needed for congestion. 44 mL 0   sulfamethoxazole-trimethoprim (BACTRIM DS) 800-160 MG tablet Take 2 tablets by mouth 2 (two)  times daily. (Patient not taking: Reported on 03/20/2018) 14 tablet 0   umeclidinium-vilanterol (ANORO ELLIPTA) 62.5-25 MCG/INH AEPB Inhale 1 puff into the lungs daily as needed (shortness of breath).      No current facility-administered medications for this visit.    REVIEW OF SYSTEMS:  [X]  denotes positive finding, [ ]  denotes negative finding Cardiac  Comments:  Chest pain or chest pressure: ***   Shortness of breath upon exertion:    Short of breath when lying flat:    Irregular heart rhythm:        Vascular    Pain in calf, thigh, or hip brought on by ambulation:    Pain in feet at night that wakes you up from your sleep:     Blood clot in your veins:    Leg swelling:         Pulmonary    Oxygen at home:    Productive cough:     Wheezing:         Neurologic  Sudden weakness in arms or legs:     Sudden numbness in arms or legs:     Sudden onset of difficulty speaking or slurred speech:    Temporary loss of vision in one eye:     Problems with dizziness:         Gastrointestinal    Blood in stool:     Vomited blood:         Genitourinary    Burning when urinating:     Blood in urine:        Psychiatric    Major depression:         Hematologic    Bleeding problems:    Problems with blood clotting too easily:        Skin    Rashes or ulcers:        Constitutional    Fever or chills:      PHYSICAL EXAM There were no vitals filed for this visit.  Constitutional: *** appearing. *** distress. Appears *** nourished.  Neurologic: CN ***. *** focal findings. *** sensory loss. Psychiatric: *** Mood and affect symmetric and appropriate. Eyes: *** No icterus. No conjunctival pallor. Ears, nose, throat: *** mucous membranes moist. Midline trachea.  Cardiac: *** rate and rhythm.  Respiratory: *** unlabored. Abdominal: *** soft, non-tender, non-distended.  Peripheral vascular: *** Extremity: *** edema. *** cyanosis. *** pallor.  Skin: *** gangrene. ***  ulceration.  Lymphatic: *** Stemmer's sign. *** palpable lymphadenopathy.  PERTINENT LABORATORY AND RADIOLOGIC DATA  Most recent CBC CBC Latest Ref Rng & Units 09/25/2021 09/10/2021 07/12/2020  WBC 4.0 - 10.5 K/uL 4.4 9.0 8.1  Hemoglobin 12.0 - 15.0 g/dL 14.5 14.6 14.3  Hematocrit 36.0 - 46.0 % 46.7(H) 45.9 46.9(H)  Platelets 150 - 400 K/uL 165 195 218     Most recent CMP CMP Latest Ref Rng & Units 09/25/2021 09/10/2021 07/12/2020  Glucose 70 - 99 mg/dL 186(H) 184(H) 126(H)  BUN 6 - 20 mg/dL 16 19 14   Creatinine 0.44 - 1.00 mg/dL 0.80 0.94 0.90  Sodium 135 - 145 mmol/L 138 135 138  Potassium 3.5 - 5.1 mmol/L 3.5 3.4(L) 3.8  Chloride 98 - 111 mmol/L 104 103 100  CO2 22 - 32 mmol/L 24 23 27   Calcium 8.9 - 10.3 mg/dL 9.1 8.9 9.4  Total Protein 6.5 - 8.1 g/dL - - 7.2  Total Bilirubin 0.3 - 1.2 mg/dL - - 0.5  Alkaline Phos 38 - 126 U/L - - 93  AST 15 - 41 U/L - - 18  ALT 0 - 44 U/L - - 18    Renal function CrCl cannot be calculated (Unknown ideal weight.).  Hgb A1c MFr Bld (%)  Date Value  08/21/2016 7.0 (H)    No results found for: LDLCALC, LDLC, HIRISKLDL, POCLDL, LDLDIRECT, REALLDLC, TOTLDLC   Vascular Imaging: ***  Nayely Dingus N. Stanford Breed, MD [facility] Phone Number: [phone] 10/15/2021 8:05 PM  Total time spent on preparing this encounter including chart review, data review, collecting history, examining the patient, coordinating care for this {tnhtimebilling:26202}  Portions of this report may have been transcribed using voice recognition software.  Every effort has been made to ensure accuracy; however, inadvertent computerized transcription errors may still be present.

## 2021-10-16 ENCOUNTER — Inpatient Hospital Stay (HOSPITAL_COMMUNITY): Admission: RE | Admit: 2021-10-16 | Payer: Medicare Other | Source: Ambulatory Visit

## 2021-10-16 ENCOUNTER — Encounter: Payer: Medicare Other | Admitting: Vascular Surgery

## 2022-03-20 ENCOUNTER — Emergency Department (HOSPITAL_COMMUNITY): Payer: Medicare HMO

## 2022-03-20 ENCOUNTER — Encounter (HOSPITAL_COMMUNITY): Payer: Self-pay

## 2022-03-20 ENCOUNTER — Emergency Department (HOSPITAL_COMMUNITY)
Admission: EM | Admit: 2022-03-20 | Discharge: 2022-03-20 | Disposition: A | Discharge status: Home / Self Care | Payer: Medicare HMO | Attending: Emergency Medicine | Admitting: Emergency Medicine

## 2022-03-20 DIAGNOSIS — H9202 Otalgia, left ear: Secondary | ICD-10-CM | POA: Insufficient documentation

## 2022-03-20 DIAGNOSIS — I110 Hypertensive heart disease with heart failure: Secondary | ICD-10-CM | POA: Insufficient documentation

## 2022-03-20 DIAGNOSIS — Z7984 Long term (current) use of oral hypoglycemic drugs: Secondary | ICD-10-CM | POA: Diagnosis not present

## 2022-03-20 DIAGNOSIS — R0602 Shortness of breath: Secondary | ICD-10-CM | POA: Insufficient documentation

## 2022-03-20 DIAGNOSIS — I5032 Chronic diastolic (congestive) heart failure: Secondary | ICD-10-CM | POA: Diagnosis not present

## 2022-03-20 DIAGNOSIS — E119 Type 2 diabetes mellitus without complications: Secondary | ICD-10-CM | POA: Insufficient documentation

## 2022-03-20 DIAGNOSIS — R079 Chest pain, unspecified: Secondary | ICD-10-CM

## 2022-03-20 DIAGNOSIS — Z79899 Other long term (current) drug therapy: Secondary | ICD-10-CM | POA: Diagnosis not present

## 2022-03-20 DIAGNOSIS — R000 Tachycardia, unspecified: Secondary | ICD-10-CM | POA: Insufficient documentation

## 2022-03-20 DIAGNOSIS — I10 Essential (primary) hypertension: Secondary | ICD-10-CM

## 2022-03-20 LAB — CBC
HCT: 44.3 % (ref 36.0–46.0)
Hemoglobin: 14.1 g/dL (ref 12.0–15.0)
MCH: 27.5 pg (ref 26.0–34.0)
MCHC: 31.8 g/dL (ref 30.0–36.0)
MCV: 86.4 fL (ref 80.0–100.0)
Platelets: 212 10*3/uL (ref 150–400)
RBC: 5.13 MIL/uL — ABNORMAL HIGH (ref 3.87–5.11)
RDW: 15.3 % (ref 11.5–15.5)
WBC: 7.3 10*3/uL (ref 4.0–10.5)
nRBC: 0 % (ref 0.0–0.2)

## 2022-03-20 LAB — TROPONIN I (HIGH SENSITIVITY)
Troponin I (High Sensitivity): 13 ng/L (ref <18)
Troponin I (High Sensitivity): 12 ng/L (ref <18)

## 2022-03-20 LAB — BASIC METABOLIC PANEL
Anion gap: 8 (ref 5–15)
BUN: 18 mg/dL (ref 8–23)
CO2: 23 mmol/L (ref 22–32)
Calcium: 9.1 mg/dL (ref 8.9–10.3)
Chloride: 110 mmol/L (ref 98–111)
Creatinine, Ser: 0.99 mg/dL (ref 0.44–1.00)
GFR, Estimated: >60 mL/min (ref >60)
Glucose, Bld: 154 mg/dL — ABNORMAL HIGH (ref 70–99)
Potassium: 4.2 mmol/L (ref 3.5–5.1)
Sodium: 141 mmol/L (ref 135–145)

## 2022-03-20 LAB — D-DIMER, QUANTITATIVE: D-Dimer, Quant: 0.41 ug/mL-FEU (ref 0.00–0.50)

## 2022-03-20 MED ORDER — AMLODIPINE BESYLATE 10 MG PO TABS: 10.0000 mg | ORAL_TABLET | Freq: Every day | ORAL | 0 refills | Status: DC

## 2022-03-20 MED ORDER — HYDROCHLOROTHIAZIDE 12.5 MG PO TABS
12.5000 mg | ORAL_TABLET | Freq: Once | ORAL | Status: AC
  Administered 2022-03-20: 12.5 mg via ORAL
  Filled 2022-03-20: qty 1

## 2022-03-20 MED ORDER — NITROGLYCERIN 0.4 MG SL SUBL
0.4000 mg | SUBLINGUAL_TABLET | SUBLINGUAL | Status: DC | PRN
  Filled 2022-03-20: qty 1

## 2022-03-20 MED ORDER — HYDROCHLOROTHIAZIDE 25 MG PO TABS: 25.0000 mg | ORAL_TABLET | Freq: Every day | ORAL | 0 refills | Status: DC

## 2022-03-20 MED ORDER — AMLODIPINE BESYLATE 5 MG PO TABS
10.0000 mg | ORAL_TABLET | Freq: Once | ORAL | Status: AC
  Administered 2022-03-20: 10 mg via ORAL
  Filled 2022-03-20: qty 2

## 2022-03-20 NOTE — ED Provider Notes (Cosign Needed)
Signout from [place] PA-C at shift change. Briefly, patient presents for chest pain.    Plan: Awaiting 2nd troponin, likely d/c if doing okay and troponin is not uptrending.     4:27 PM Reassessment performed. Patient appears comfortable.  Labs and imaging personally reviewed and interpreted including: Normal D-dimer, CBC unremarkable, BMP unremarkable except for glucose 154, troponin 13>12.    Reviewed additional pertinent lab work and imaging with patient at bedside.  She is instructed to take blood pressure medications as prescribed.   Most current vital signs reviewed and are as follows: BP (!) 154/109   Pulse 97   Temp 99.2 F (37.3 C) (Oral)   Resp 20   Ht 5' (1.524 m)   Wt 78 kg   SpO2 100%   BMI 33.59 kg/m   Plan: Discharge to home.   Home treatment: Restart BP meds, rest   Return and follow-up instructions: I encouraged patient to return to ED with severe chest pain, especially if the pain is crushing or pressure-like and spreads to the arms, back, neck, or jaw, or if they have associated sweating, vomiting, or shortness of breath with the pain, or significant pain with activity. We discussed that the evaluation here today indicates a low-risk of serious cause of chest pain, including heart trouble or a blood clot, but no evaluation is perfect and chest pain can evolve with time. The patient verbalized understanding and agreed.  I encouraged patient to follow-up with their provider in the next 48 hours for recheck.         Renne Crigler, PA-C 03/20/22 1629

## 2022-03-20 NOTE — ED Triage Notes (Signed)
Pt states that earlier this morning she experienced midsternal non-radiating CP, which she says has now resolved. Pt with noted HTN in triage, states that she has been out of her medications for approximately five days. Denies SOB, N/V, diaphoresis

## 2022-03-20 NOTE — ED Provider Notes (Cosign Needed)
[facility]-EMERGENCY DEPT Provider Note   CSN: [id number] Arrival date & time: 03/20/22  1152     History  Chief Complaint  Patient presents with   Chest Pain    HARA MILHOLLAND is a 61 y.o. female.  Patient presents to the hospital complaining of chest pain that began earlier this morning.  Patient states that she had a sudden onset of substernal/left-sided chest pain that was rated at 7 out of 10.  She stated it made her feel very anxious and scared.  She states that it is now decreased to 6 out of 10.  Denies radiation of symptoms.  Endorses mild shortness of breath with it.  Denies abdominal pain, nausea, vomiting.  Patient takes nothing for the condition prior to arrival.  Past medical history significant for chronic diastolic heart failure, type 2 diabetes, hypertensive heart disease, hypertension, obesity, atherosclerosis of aorta, history of progressive angina  HPI     Home Medications Prior to Admission medications   Medication Sig Start Date End Date Taking? Authorizing Provider  albuterol (PROVENTIL HFA;VENTOLIN HFA) 108 (90 Base) MCG/ACT inhaler Inhale 1-2 puffs into the lungs every 4 (four) hours as needed for wheezing or shortness of breath. 08/12/16  Yes Cheri Fowler, PA-C  amLODipine (NORVASC) 10 MG tablet Take 1 tablet (10 mg total) by mouth daily. 03/20/22 04/19/22 Yes Darrick Grinder, PA-C  hydrochlorothiazide (HYDRODIURIL) 25 MG tablet Take 1 tablet (25 mg total) by mouth daily. 03/20/22 04/19/22 Yes Darrick Grinder, PA-C  Multiple Vitamin (MULTIVITAMIN WITH MINERALS) TABS tablet Take 1 tablet by mouth daily.    Yes [provider]  naproxen sodium (ANAPROX) 220 MG tablet Take 220 mg by mouth 2 (two) times daily as needed (for pain).    Yes [provider]  umeclidinium-vilanterol (ANORO ELLIPTA) 62.5-25 MCG/INH AEPB Inhale 1 puff into the lungs daily as needed (shortness of breath).    Yes [provider]  valsartan  (DIOVAN) 160 MG tablet Take 160 mg by mouth daily.   Yes [provider]  amLODipine (NORVASC) 10 MG tablet Take 1 tablet (10 mg total) by mouth daily. Patient not taking: Reported on 03/20/2022 07/12/20   Rolan Bucco, MD  amoxicillin (AMOXIL) 500 MG capsule Take 1 capsule (500 mg total) by mouth 3 (three) times daily. Patient not taking: Reported on 03/20/2022 09/10/21   Zadie Rhine, MD  doxycycline (VIBRAMYCIN) 100 MG capsule Take 1 capsule (100 mg total) by mouth 2 (two) times daily. One po bid x 7 days Patient not taking: Reported on 03/20/2022 09/25/21   Mesner, Barbara Cower, MD  fluticasone [facility]) 50 MCG/ACT nasal spray Place 2 sprays into both nostrils daily. 09/25/21   Mesner, Barbara Cower, MD  hydrochlorothiazide (HYDRODIURIL) 25 MG tablet Take 1 tablet (25 mg total) by mouth daily. Patient not taking: Reported on 03/20/2022 07/12/20   Rolan Bucco, MD  HYDROcodone-acetaminophen (NORCO/VICODIN) 5-325 MG tablet Take 1 tablet by mouth every 6 (six) hours as needed for severe pain. Patient not taking: Reported on 03/20/2022 09/10/21   Zadie Rhine, MD  lidocaine (LIDODERM) 5 % Place 1 patch onto the skin daily. Remove & Discard patch within 12 hours or as directed by MD Patient not taking: Reported on 03/20/2022 03/25/19   Harlene Salts A, PA-C  metFORMIN (GLUCOPHAGE) 500 MG tablet Take 1 tablet (500 mg total) by mouth 2 (two) times daily with a meal. Patient not taking: Reported on 03/20/2022 07/12/20   Rolan Bucco, MD  metoprolol succinate (TOPROL-XL) 100  MG 24 hr tablet Take 1 tablet (100 mg total) by mouth daily. Take with or immediately following a meal Patient not taking: Reported on 03/20/2022 07/12/20   Rolan Bucco, MD  sodium chloride (OCEAN) 0.65 % SOLN nasal spray Place 1 spray into both nostrils as needed for congestion. Patient not taking: Reported on 03/20/2022 09/25/21   Mesner, Barbara Cower, MD  sulfamethoxazole-trimethoprim (BACTRIM DS) 800-160 MG tablet Take 2 tablets  by mouth 2 (two) times daily. Patient not taking: Reported on 03/20/2018 10/25/17   Pisciotta, Joni Reining, PA-C      Allergies    Codeine and Morphine and related    Review of Systems   Review of Systems  Constitutional:  Negative for fever.  Respiratory:  Positive for shortness of breath. Negative for cough and wheezing.   Cardiovascular:  Positive for chest pain. Negative for palpitations.  Gastrointestinal:  Negative for abdominal pain, nausea and vomiting.  Genitourinary:  Negative for dysuria.    Physical Exam Updated Vital Signs BP (!) 154/109   Pulse 97   Temp 99.2 F (37.3 C) (Oral)   Resp 20   Ht 5' (1.524 m)   Wt 78 kg   SpO2 100%   BMI 33.59 kg/m  Physical Exam Vitals and nursing note reviewed.  Constitutional:      General: She is not in acute distress.    Appearance: She is obese.  HENT:     Head: Normocephalic and atraumatic.  Eyes:     Extraocular Movements: Extraocular movements intact.  Cardiovascular:     Rate and Rhythm: Normal rate and regular rhythm.     Heart sounds: Normal heart sounds.  Pulmonary:     Effort: Pulmonary effort is normal. No tachypnea or respiratory distress.     Breath sounds: Normal breath sounds.  Abdominal:     Palpations: Abdomen is soft.     Tenderness: There is no abdominal tenderness.  Musculoskeletal:     Cervical back: Normal range of motion and neck supple.     Right lower leg: No edema.     Left lower leg: No edema.  Skin:    General: Skin is warm and dry.     Capillary Refill: Capillary refill takes less than 2 seconds.  Neurological:     Mental Status: She is alert.     ED Results / Procedures / Treatments   Labs (all labs ordered are listed, but only abnormal results are displayed) Labs Reviewed  BASIC METABOLIC PANEL - Abnormal; Notable for the following components:      Result Value   Glucose, Bld 154 (*)    All other components within normal limits  CBC - Abnormal; Notable for the following  components:   RBC 5.13 (*)    All other components within normal limits  D-DIMER, QUANTITATIVE  TROPONIN I (HIGH SENSITIVITY)  TROPONIN I (HIGH SENSITIVITY)    EKG EKG Interpretation  Date/Time:  Wednesday March 20 2022 12:01:01 EDT Ventricular Rate:  108 PR Interval:  134 QRS Duration: 81 QT Interval:  336 QTC Calculation: 451 R Axis:   22 Text Interpretation: Sinus tachycardia Consider right atrial enlargement Consider left ventricular hypertrophy No acute changes No significant change since last tracing Confirmed by Derwood Kaplan ([id number]) on 03/20/2022 2:28:29 PM  Radiology DG Chest Port 1 View  Result Date: 03/20/2022 CLINICAL DATA:  Chest pain EXAM: PORTABLE CHEST 1 VIEW COMPARISON:  September 25, 2021 chest radiograph FINDINGS: The heart size and mediastinal contours are within normal limits. Aortic  atherosclerosis. No focal airspace consolidation. No visible pleural effusion or pneumothorax. The visualized skeletal structures are unremarkable. IMPRESSION: No acute cardiopulmonary disease Aortic Atherosclerosis (ICD10-I70.0). Electronically Signed   By: Maudry Mayhew M.D.   On: 03/20/2022 14:02    Procedures Procedures    Medications Ordered in ED Medications  nitroGLYCERIN (NITROSTAT) SL tablet 0.4 mg (has no administration in time range)  amLODipine (NORVASC) tablet 10 mg (10 mg Oral Given 03/20/22 1322)  hydrochlorothiazide (HYDRODIURIL) tablet 12.5 mg (12.5 mg Oral Given 03/20/22 1322)    ED Course/ Medical Decision Making/ A&P                           Medical Decision Making Amount and/or Complexity of Data Reviewed Labs: ordered. Radiology: ordered.  Risk Prescription drug management.   This patient presents to the ED for concern of chest pain, this involves an extensive number of treatment options, and is a complaint that carries with it a high risk of complications and morbidity.  The differential diagnosis includes ACS, PE, angina, pneumonia, and  others   Co morbidities that complicate the patient evaluation  History of chronic diastolic heart failure, hypertension, progressive angina  Lab Tests:  I Ordered, and personally interpreted labs.  The pertinent results include:  RBC 5.13, D-dimer 0.41, glucose 154, initial troponin 13   Imaging Studies ordered:  I ordered imaging studies including chest x-ray I independently visualized and interpreted imaging which showed no acute cardiopulmonary disease I agree with the radiologist interpretation   Cardiac Monitoring: / EKG:  The patient was maintained on a cardiac monitor.  I personally viewed and interpreted the cardiac monitored which showed an underlying rhythm of: Sinus tachycardia   Problem List / ED Course / Critical interventions / Medication management   I ordered medication including amlodipine and HCTZ for hypertension, nitroglycerin for chest pain Reevaluation of the patient after these medicines showed that the patient improved I have reviewed the patients home medicines and have made adjustments as needed   Test / Admission - Considered:  Patient care being transferred to [facility], PA-C at shift handoff. Negative D-dimer reassuring for no sign of PE.  Lung fields are clear with clear chest x-ray, no signs of pneumonia.  EKG and initial troponin reassuring for no signs of ACS at this time.  Possible angina versus musculoskeletal type pain.  Plan on repeat troponin.  If troponins are flat, probable discharge home        Final Clinical Impression(s) / ED Diagnoses Final diagnoses:  Chest pain, unspecified type  Primary hypertension  Left ear pain    Rx / DC Orders ED Discharge Orders          Ordered    amLODipine (NORVASC) 10 MG tablet  Daily        03/20/22 1513    hydrochlorothiazide (HYDRODIURIL) 25 MG tablet  Daily        03/20/22 1513              Pamala Duffel 03/20/22 1537

## 2022-03-20 NOTE — Discharge Instructions (Addendum)
You were seen today for chest pain. Your workup was reassuring for no signs of acute coronary syndrome, pulmonary embolism, or pneumonia. This may have been related to the high blood pressure which was due to not taking medications. I have refilled your blood pressure medications for 30 days. You need to follow up at the health department for refills and re-evaluation. At your request, I have provided contact information for ENT for follow up on left ear pain. If your chest pain worsens, you have shortness of breath, or develop other life threats, please return for further evaluation

## 2022-03-20 NOTE — ED Notes (Signed)
Pt ambulated to restroom. 

## 2022-04-12 ENCOUNTER — Telehealth: Payer: Medicare HMO | Admitting: Physician Assistant

## 2022-04-12 DIAGNOSIS — H66004 Acute suppurative otitis media without spontaneous rupture of ear drum, recurrent, right ear: Secondary | ICD-10-CM | POA: Diagnosis not present

## 2022-04-12 MED ORDER — AZITHROMYCIN 250 MG PO TABS: ORAL_TABLET | ORAL | 0 refills | Status: AC

## 2022-04-12 MED ORDER — TRIAMCINOLONE ACETONIDE 55 MCG/ACT NA AERO: 2.0000 | INHALATION_SPRAY | Freq: Every day | NASAL | 0 refills | Status: DC

## 2022-04-12 NOTE — Progress Notes (Signed)
Virtual Visit Consent   Debbie Davis, you are scheduled for a virtual visit with a [name] provider today. Just as with appointments in the office, your consent must be obtained to participate. Your consent will be active for this visit and any virtual visit you may have with one of our providers in the next 365 days. If you have a MyChart account, a copy of this consent can be sent to you electronically.  As this is a virtual visit, video technology does not allow for your provider to perform a traditional examination. This may limit your provider's ability to fully assess your condition. If your provider identifies any concerns that need to be evaluated in person or the need to arrange testing (such as labs, EKG, etc.), we will make arrangements to do so. Although advances in technology are sophisticated, we cannot ensure that it will always work on either your end or our end. If the connection with a video visit is poor, the visit may have to be switched to a telephone visit. With either a video or telephone visit, we are not always able to ensure that we have a secure connection.  By engaging in this virtual visit, you consent to the provision of healthcare and authorize for your insurance to be billed (if applicable) for the services provided during this visit. Depending on your insurance coverage, you may receive a charge related to this service.  I need to obtain your verbal consent now. Are you willing to proceed with your visit today? Debbie Davis has provided verbal consent on 04/12/2022 for a virtual visit (video or telephone). Debbie Davis, [place]  Date: 04/12/2022 4:00 PM  Virtual Visit via Video Note   I, Debbie Davis, connected with  Debbie Davis  ([id number], 08/25/1961) on 04/12/22 at  3:30 PM EDT by a video-enabled telemedicine application and verified that I am speaking with the correct person using two identifiers.  Location: Patient: Virtual Visit Location  Patient: Home Provider: Virtual Visit Location Provider: Home Office   I discussed the limitations of evaluation and management by telemedicine and the availability of in person appointments. The patient expressed understanding and agreed to proceed.    History of Present Illness: Debbie Davis is a 61 y.o. who identifies as a female who was assigned female at birth, and is being seen today for concern fof a continued R ear infection. Was seen at urgent care a couple of weeks ago and diagnosed for bilateral AOM. Was started on Amoxicillin for a 10-day course. Notes taking and completing as directed. Noted complete resolution of pain and other L ear symptoms. R ear was mostly resolved but with some residual mild fullness. Since then R ear pain has recurred and been continuing to build. Notes some mild decreased hearing. Denies dizziness. Denies fever, chills, tinnitus. Denies external ear swelling or drainage from the ear. Debbie Davis  HPI: HPI  Problems:  Patient Active Problem List   Diagnosis Date Noted   Progressive angina (HCC) 11/25/2016   Atherosclerosis of aorta (HCC) 11/25/2016   Chronic diastolic heart failure (HCC) 11/25/2016   Obesity (BMI 30-39.9) 11/25/2016   Dyspnea 11/25/2016   Diabetes type 2, uncontrolled 08/22/2016   Hypertensive heart disease 08/20/2016    Allergies:  Allergies  Allergen Reactions   Codeine Rash   Morphine And Related Rash   Medications:  Current Outpatient Medications:    albuterol (PROVENTIL HFA;VENTOLIN HFA) 108 (90 Base) MCG/ACT inhaler, Inhale 1-2 puffs into the  lungs every 4 (four) hours as needed for wheezing or shortness of breath., Disp: 1 Inhaler, Rfl: 0   amLODipine (NORVASC) 10 MG tablet, Take 1 tablet (10 mg total) by mouth daily. (Patient not taking: Reported on 03/20/2022), Disp: 30 tablet, Rfl: 1   amLODipine (NORVASC) 10 MG tablet, Take 1 tablet (10 mg total) by mouth daily., Disp: 30 tablet, Rfl: 0   hydrochlorothiazide (HYDRODIURIL) 25 MG  tablet, Take 1 tablet (25 mg total) by mouth daily. (Patient not taking: Reported on 03/20/2022), Disp: 30 tablet, Rfl: 1   hydrochlorothiazide (HYDRODIURIL) 25 MG tablet, Take 1 tablet (25 mg total) by mouth daily., Disp: 30 tablet, Rfl: 0   HYDROcodone-acetaminophen (NORCO/VICODIN) 5-325 MG tablet, Take 1 tablet by mouth every 6 (six) hours as needed for severe pain. (Patient not taking: Reported on 03/20/2022), Disp: 5 tablet, Rfl: 0   lidocaine (LIDODERM) 5 %, Place 1 patch onto the skin daily. Remove & Discard patch within 12 hours or as directed by MD (Patient not taking: Reported on 03/20/2022), Disp: 30 patch, Rfl: 0   metFORMIN (GLUCOPHAGE) 500 MG tablet, Take 1 tablet (500 mg total) by mouth 2 (two) times daily with a meal. (Patient not taking: Reported on 03/20/2022), Disp: 60 tablet, Rfl: 1   metoprolol succinate (TOPROL-XL) 100 MG 24 hr tablet, Take 1 tablet (100 mg total) by mouth daily. Take with or immediately following a meal (Patient not taking: Reported on 03/20/2022), Disp: 30 tablet, Rfl: 1   Multiple Vitamin (MULTIVITAMIN WITH MINERALS) TABS tablet, Take 1 tablet by mouth daily. , Disp: , Rfl:    naproxen sodium (ANAPROX) 220 MG tablet, Take 220 mg by mouth 2 (two) times daily as needed (for pain). , Disp: , Rfl:    sodium chloride (OCEAN) 0.65 % SOLN nasal spray, Place 1 spray into both nostrils as needed for congestion. (Patient not taking: Reported on 03/20/2022), Disp: 44 mL, Rfl: 0   umeclidinium-vilanterol (ANORO ELLIPTA) 62.5-25 MCG/INH AEPB, Inhale 1 puff into the lungs daily as needed (shortness of breath). , Disp: , Rfl:    valsartan (DIOVAN) 160 MG tablet, Take 160 mg by mouth daily., Disp: , Rfl:   Observations/Objective: Patient is well-developed, well-nourished in no acute distress.  Resting comfortably at home.  Head is normocephalic, atraumatic.  No labored breathing. Speech is clear and coherent with logical content.  Patient is alert and oriented at baseline.    Assessment and Plan: 1. Recurrent acute suppurative otitis media of right ear without spontaneous rupture of tympanic membrane  Concern for incomplete treatment. Will start Azithromycin. Start Nasacort. Supportive measures and OTC medications reviewed. Will need in-office evaluation for any non-resolving or worsening symptoms despite treatment.   Follow Up Instructions: I discussed the assessment and treatment plan with the patient. The patient was provided an opportunity to ask questions and all were answered. The patient agreed with the plan and demonstrated an understanding of the instructions.  A copy of instructions were sent to the patient via MyChart unless otherwise noted below.   The patient was advised to call back or seek an in-person evaluation if the symptoms worsen or if the condition fails to improve as anticipated.  Time:  I spent 10 minutes with the patient via telehealth technology discussing the above problems/concerns.    Debbie Climes, PA-C

## 2022-04-12 NOTE — Patient Instructions (Signed)
Debbie Davis, thank you for joining [name], PA-C for today's virtual visit.  While this provider is not your primary care provider (PCP), if your PCP is located in our provider database this encounter information will be shared with them immediately following your visit.  Consent: (Patient) Debbie Davis provided verbal consent for this virtual visit at the beginning of the encounter.  Current Medications:  Current Outpatient Medications:    albuterol (PROVENTIL HFA;VENTOLIN HFA) 108 (90 Base) MCG/ACT inhaler, Inhale 1-2 puffs into the lungs every 4 (four) hours as needed for wheezing or shortness of breath., Disp: 1 Inhaler, Rfl: 0   amLODipine (NORVASC) 10 MG tablet, Take 1 tablet (10 mg total) by mouth daily. (Patient not taking: Reported on 03/20/2022), Disp: 30 tablet, Rfl: 1   amLODipine (NORVASC) 10 MG tablet, Take 1 tablet (10 mg total) by mouth daily., Disp: 30 tablet, Rfl: 0   amoxicillin (AMOXIL) 500 MG capsule, Take 1 capsule (500 mg total) by mouth 3 (three) times daily. (Patient not taking: Reported on 03/20/2022), Disp: 21 capsule, Rfl: 0   doxycycline (VIBRAMYCIN) 100 MG capsule, Take 1 capsule (100 mg total) by mouth 2 (two) times daily. One po bid x 7 days (Patient not taking: Reported on 03/20/2022), Disp: 14 capsule, Rfl: 0   fluticasone (FLONASE) 50 MCG/ACT nasal spray, Place 2 sprays into both nostrils daily., Disp: 16 g, Rfl: 0   hydrochlorothiazide (HYDRODIURIL) 25 MG tablet, Take 1 tablet (25 mg total) by mouth daily. (Patient not taking: Reported on 03/20/2022), Disp: 30 tablet, Rfl: 1   hydrochlorothiazide (HYDRODIURIL) 25 MG tablet, Take 1 tablet (25 mg total) by mouth daily., Disp: 30 tablet, Rfl: 0   HYDROcodone-acetaminophen (NORCO/VICODIN) 5-325 MG tablet, Take 1 tablet by mouth every 6 (six) hours as needed for severe pain. (Patient not taking: Reported on 03/20/2022), Disp: 5 tablet, Rfl: 0   lidocaine (LIDODERM) 5 %, Place 1 patch onto the skin daily.  Remove & Discard patch within 12 hours or as directed by MD (Patient not taking: Reported on 03/20/2022), Disp: 30 patch, Rfl: 0   metFORMIN (GLUCOPHAGE) 500 MG tablet, Take 1 tablet (500 mg total) by mouth 2 (two) times daily with a meal. (Patient not taking: Reported on 03/20/2022), Disp: 60 tablet, Rfl: 1   metoprolol succinate (TOPROL-XL) 100 MG 24 hr tablet, Take 1 tablet (100 mg total) by mouth daily. Take with or immediately following a meal (Patient not taking: Reported on 03/20/2022), Disp: 30 tablet, Rfl: 1   Multiple Vitamin (MULTIVITAMIN WITH MINERALS) TABS tablet, Take 1 tablet by mouth daily. , Disp: , Rfl:    naproxen sodium (ANAPROX) 220 MG tablet, Take 220 mg by mouth 2 (two) times daily as needed (for pain). , Disp: , Rfl:    sodium chloride (OCEAN) 0.65 % SOLN nasal spray, Place 1 spray into both nostrils as needed for congestion. (Patient not taking: Reported on 03/20/2022), Disp: 44 mL, Rfl: 0   sulfamethoxazole-trimethoprim (BACTRIM DS) 800-160 MG tablet, Take 2 tablets by mouth 2 (two) times daily. (Patient not taking: Reported on 03/20/2018), Disp: 14 tablet, Rfl: 0   umeclidinium-vilanterol (ANORO ELLIPTA) 62.5-25 MCG/INH AEPB, Inhale 1 puff into the lungs daily as needed (shortness of breath). , Disp: , Rfl:    valsartan (DIOVAN) 160 MG tablet, Take 160 mg by mouth daily., Disp: , Rfl:    Medications ordered in this encounter:  No orders of the defined types were placed in this encounter.    *If you  need refills on other medications prior to your next appointment, please contact your pharmacy*  Follow-Up: Call back or seek an in-person evaluation if the symptoms worsen or if the condition fails to improve as anticipated.  Other Instructions Please take the antibiotic as directed. Start the Nasacort once daily as directed. Tylenol for pain. If not resolving or any new/worsening symptoms despite treatment, please seek an in-person evaluation. DO NOT DELAY CARE.     If  you have been instructed to have an in-person evaluation today at a local Urgent Care facility, please use the link below. It will take you to a list of all of our available [facility], including address, phone number and hours of operation. Please do not delay care.  [facility]  If you or a family member do not have a primary care provider, use the link below to schedule a visit and establish care. When you choose a [facility] primary care physician or advanced practice provider, you gain a long-term partner in health. Find a Primary Care Provider  Learn more about [facility] in-office and virtual care options: [facility] - Get Care Now

## 2022-07-18 ENCOUNTER — Emergency Department (HOSPITAL_COMMUNITY): Payer: Medicare HMO

## 2022-07-18 ENCOUNTER — Emergency Department (HOSPITAL_COMMUNITY)
Admission: EM | Admit: 2022-07-18 | Discharge: 2022-07-19 | Disposition: A | Discharge status: Home / Self Care | Payer: Medicare HMO | Attending: Emergency Medicine | Admitting: Emergency Medicine

## 2022-07-18 DIAGNOSIS — E119 Type 2 diabetes mellitus without complications: Secondary | ICD-10-CM | POA: Diagnosis not present

## 2022-07-18 DIAGNOSIS — I110 Hypertensive heart disease with heart failure: Secondary | ICD-10-CM | POA: Diagnosis not present

## 2022-07-18 DIAGNOSIS — R109 Unspecified abdominal pain: Secondary | ICD-10-CM | POA: Diagnosis not present

## 2022-07-18 DIAGNOSIS — I5030 Unspecified diastolic (congestive) heart failure: Secondary | ICD-10-CM | POA: Diagnosis not present

## 2022-07-18 DIAGNOSIS — Z79899 Other long term (current) drug therapy: Secondary | ICD-10-CM | POA: Diagnosis not present

## 2022-07-18 DIAGNOSIS — R300 Dysuria: Secondary | ICD-10-CM | POA: Diagnosis present

## 2022-07-18 DIAGNOSIS — N12 Tubulo-interstitial nephritis, not specified as acute or chronic: Secondary | ICD-10-CM | POA: Diagnosis not present

## 2022-07-18 NOTE — ED Triage Notes (Signed)
Patient was seen in Urgent Care on 10.13.2023 and was diagnosed with right kidney infection. Given Cipro and she has not been able to take meds dt making her sick. Complaining of Rt side flank pain. Rates 10/10.

## 2022-07-19 LAB — COMPREHENSIVE METABOLIC PANEL
ALT: 18 U/L (ref 0–44)
AST: 18 U/L (ref 15–41)
Albumin: 3.9 g/dL (ref 3.5–5.0)
Alkaline Phosphatase: 77 U/L (ref 38–126)
Anion gap: 7 (ref 5–15)
BUN: 40 mg/dL — ABNORMAL HIGH (ref 8–23)
CO2: 25 mmol/L (ref 22–32)
Calcium: 9.6 mg/dL (ref 8.9–10.3)
Chloride: 108 mmol/L (ref 98–111)
Creatinine, Ser: 1.13 mg/dL — ABNORMAL HIGH (ref 0.44–1.00)
GFR, Estimated: 55 mL/min — ABNORMAL LOW (ref >60)
Glucose, Bld: 188 mg/dL — ABNORMAL HIGH (ref 70–99)
Potassium: 3.7 mmol/L (ref 3.5–5.1)
Sodium: 140 mmol/L (ref 135–145)
Total Bilirubin: 0.4 mg/dL (ref 0.3–1.2)
Total Protein: 7.8 g/dL (ref 6.5–8.1)

## 2022-07-19 LAB — URINALYSIS, ROUTINE W REFLEX MICROSCOPIC
Bilirubin Urine: NEGATIVE
Glucose, UA: NEGATIVE mg/dL
Hgb urine dipstick: NEGATIVE
Ketones, ur: NEGATIVE mg/dL
Nitrite: NEGATIVE
Protein, ur: NEGATIVE mg/dL
Specific Gravity, Urine: 1.024 (ref 1.005–1.030)
WBC, UA: >50 WBC/hpf — ABNORMAL HIGH (ref 0–5)
pH: 5 (ref 5.0–8.0)

## 2022-07-19 LAB — CBC WITH DIFFERENTIAL/PLATELET
Abs Immature Granulocytes: 0.03 10*3/uL (ref 0.00–0.07)
Basophils Absolute: 0.1 10*3/uL (ref 0.0–0.1)
Basophils Relative: 1 %
Eosinophils Absolute: 0.2 10*3/uL (ref 0.0–0.5)
Eosinophils Relative: 2 %
HCT: 42.6 % (ref 36.0–46.0)
Hemoglobin: 13.3 g/dL (ref 12.0–15.0)
Immature Granulocytes: 0 %
Lymphocytes Relative: 33 %
Lymphs Abs: 3.3 10*3/uL (ref 0.7–4.0)
MCH: 27.3 pg (ref 26.0–34.0)
MCHC: 31.2 g/dL (ref 30.0–36.0)
MCV: 87.5 fL (ref 80.0–100.0)
Monocytes Absolute: 0.8 10*3/uL (ref 0.1–1.0)
Monocytes Relative: 8 %
Neutro Abs: 5.7 10*3/uL (ref 1.7–7.7)
Neutrophils Relative %: 56 %
Platelets: 210 10*3/uL (ref 150–400)
RBC: 4.87 MIL/uL (ref 3.87–5.11)
RDW: 14.5 % (ref 11.5–15.5)
WBC: 10.1 10*3/uL (ref 4.0–10.5)
nRBC: 0 % (ref 0.0–0.2)

## 2022-07-19 LAB — LIPASE, BLOOD: Lipase: 48 U/L (ref 11–51)

## 2022-07-19 MED ORDER — ONDANSETRON HCL 4 MG/2ML IJ SOLN
4.0000 mg | Freq: Once | INTRAMUSCULAR | Status: AC
  Administered 2022-07-19: 4 mg via INTRAVENOUS
  Filled 2022-07-19: qty 2

## 2022-07-19 MED ORDER — FENTANYL CITRATE PF 50 MCG/ML IJ SOSY
50.0000 ug | PREFILLED_SYRINGE | Freq: Once | INTRAMUSCULAR | Status: AC
  Administered 2022-07-19: 50 ug via INTRAVENOUS
  Filled 2022-07-19: qty 1

## 2022-07-19 MED ORDER — CIPROFLOXACIN IN D5W 400 MG/200ML IV SOLN: 400.0000 mg | Freq: Once | INTRAVENOUS | Status: DC

## 2022-07-19 MED ORDER — SODIUM CHLORIDE 0.9 % IV BOLUS
1000.0000 mL | Freq: Once | INTRAVENOUS | Status: AC
  Administered 2022-07-19: 1000 mL via INTRAVENOUS

## 2022-07-19 MED ORDER — CEFADROXIL 500 MG PO CAPS: 500.0000 mg | ORAL_CAPSULE | Freq: Two times a day (BID) | ORAL | 0 refills | Status: DC

## 2022-07-19 MED ORDER — SODIUM CHLORIDE 0.9 % IV SOLN
2.0000 g | Freq: Once | INTRAVENOUS | Status: AC
  Administered 2022-07-19: 2 g via INTRAVENOUS
  Filled 2022-07-19: qty 20

## 2022-07-19 MED ORDER — ONDANSETRON HCL 4 MG PO TABS: 4.0000 mg | ORAL_TABLET | Freq: Four times a day (QID) | ORAL | 0 refills | Status: DC

## 2022-07-19 NOTE — Discharge Instructions (Addendum)
Please use Tylenol or ibuprofen for pain.  You may use 600 mg ibuprofen every 6 hours or 1000 mg of Tylenol every 6 hours.  You may choose to alternate between the 2.  This would be most effective.  Not to exceed 4 g of Tylenol within 24 hours.  Not to exceed 3200 mg ibuprofen 24 hours.  Recommend that you take Zofran 30 minutes before you take the antibiotic to help with any nausea, as well as I recommend that you take your antibiotic on a full stomach.  Please finish the entire course of antibiotics, if your bacterial infection is not susceptible to the antibiotic I placed you on you will receive a call from our pharmacist for an alternative that will cover this infection

## 2022-07-19 NOTE — ED Provider Notes (Signed)
[facility]-EMERGENCY DEPT Provider Note   CSN: [id number] Arrival date & time: 07/18/22  1939     History  No chief complaint on file.   Debbie Davis is a 61 y.o. female with past medical history significant for hypertension, diabetes, diastolic heart failure, obesity presents with concern for right-sided kidney infection diagnosed 07/12/2022 at urgent care.  Patient reports that she has been having dysuria, urinary frequency, flank pain for greater than a week, try to take the Cipro medication for 2 days, but reports the she did not feel like she was having any improvement she was having significant nausea and vomiting with this medication and so could not tolerate continuing to take it.  Patient did not call anyone to try to change in antibiotic or for any nausea medication.  Patient reports that she has been using Tylenol for the pain without significant relief.  She rates the pain 10/10 at this time.  HPI     Home Medications Prior to Admission medications   Medication Sig Start Date End Date Taking? Authorizing Provider  cefadroxil (DURICEF) 500 MG capsule Take 1 capsule (500 mg total) by mouth 2 (two) times daily. 07/19/22  Yes Raeonna Milo H, PA-C  ondansetron (ZOFRAN) 4 MG tablet Take 1 tablet (4 mg total) by mouth every 6 (six) hours. 07/19/22  Yes Marquez Ceesay H, PA-C  albuterol (PROVENTIL HFA;VENTOLIN HFA) 108 (90 Base) MCG/ACT inhaler Inhale 1-2 puffs into the lungs every 4 (four) hours as needed for wheezing or shortness of breath. 08/12/16   Cheri Fowler, PA-C  amLODipine (NORVASC) 10 MG tablet Take 1 tablet (10 mg total) by mouth daily. Patient not taking: Reported on 03/20/2022 07/12/20   Rolan Bucco, MD  amLODipine (NORVASC) 10 MG tablet Take 1 tablet (10 mg total) by mouth daily. 03/20/22 04/19/22  Darrick Grinder, PA-C  hydrochlorothiazide (HYDRODIURIL) 25 MG tablet Take 1 tablet (25 mg total) by mouth daily. Patient not taking:  Reported on 03/20/2022 07/12/20   Rolan Bucco, MD  hydrochlorothiazide (HYDRODIURIL) 25 MG tablet Take 1 tablet (25 mg total) by mouth daily. 03/20/22 04/19/22  Darrick Grinder, PA-C  HYDROcodone-acetaminophen (NORCO/VICODIN) 5-325 MG tablet Take 1 tablet by mouth every 6 (six) hours as needed for severe pain. Patient not taking: Reported on 03/20/2022 09/10/21   Zadie Rhine, MD  lidocaine (LIDODERM) 5 % Place 1 patch onto the skin daily. Remove & Discard patch within 12 hours or as directed by MD Patient not taking: Reported on 03/20/2022 03/25/19   Harlene Salts A, PA-C  metFORMIN (GLUCOPHAGE) 500 MG tablet Take 1 tablet (500 mg total) by mouth 2 (two) times daily with a meal. Patient not taking: Reported on 03/20/2022 07/12/20   Rolan Bucco, MD  metoprolol succinate (TOPROL-XL) 100 MG 24 hr tablet Take 1 tablet (100 mg total) by mouth daily. Take with or immediately following a meal Patient not taking: Reported on 03/20/2022 07/12/20   Rolan Bucco, MD  Multiple Vitamin (MULTIVITAMIN WITH MINERALS) TABS tablet Take 1 tablet by mouth daily.     [provider]  naproxen sodium (ANAPROX) 220 MG tablet Take 220 mg by mouth 2 (two) times daily as needed (for pain).     [provider]  sodium chloride (OCEAN) 0.65 % SOLN nasal spray Place 1 spray into both nostrils as needed for congestion. Patient not taking: Reported on 03/20/2022 09/25/21   Mesner, Barbara Cower, MD  triamcinolone (NASACORT) 55 MCG/ACT AERO nasal inhaler Place 2 sprays into the  nose daily. 04/12/22   Waldon Merl, PA-C  umeclidinium-vilanterol (ANORO ELLIPTA) 62.5-25 MCG/INH AEPB Inhale 1 puff into the lungs daily as needed (shortness of breath).     [provider]  valsartan (DIOVAN) 160 MG tablet Take 160 mg by mouth daily.    [provider]      Allergies    Codeine and Morphine and related    Review of Systems   Review of Systems  All other systems reviewed and are  negative.   Physical Exam Updated Vital Signs BP 124/71 (BP Location: Left Arm)   Pulse 81   Temp 98.2 F (36.8 C) (Oral)   Resp 16   Ht 5' (1.524 m)   Wt 76.2 kg   SpO2 100%   BMI 32.81 kg/m  Physical Exam Vitals and nursing note reviewed.  Constitutional:      General: She is not in acute distress.    Appearance: Normal appearance.  HENT:     Head: Normocephalic and atraumatic.  Eyes:     General:        Right eye: No discharge.        Left eye: No discharge.  Cardiovascular:     Rate and Rhythm: Normal rate and regular rhythm.     Heart sounds: No murmur heard.    No friction rub. No gallop.  Pulmonary:     Effort: Pulmonary effort is normal.     Breath sounds: Normal breath sounds.  Abdominal:     General: Bowel sounds are normal.     Palpations: Abdomen is soft.     Comments: Tenderness to palpation in the right flank without rebound, rigidity, guarding, negative CVA tenderness bilaterally.  Normal bowel sounds throughout.  Skin:    General: Skin is warm and dry.     Capillary Refill: Capillary refill takes less than 2 seconds.  Neurological:     Mental Status: She is alert and oriented to person, place, and time.  Psychiatric:        Mood and Affect: Mood normal.        Behavior: Behavior normal.     ED Results / Procedures / Treatments   Labs (all labs ordered are listed, but only abnormal results are displayed) Labs Reviewed  COMPREHENSIVE METABOLIC PANEL - Abnormal; Notable for the following components:      Result Value   Glucose, Bld 188 (*)    BUN 40 (*)    Creatinine, Ser 1.13 (*)    GFR, Estimated 55 (*)    All other components within normal limits  URINALYSIS, ROUTINE W REFLEX MICROSCOPIC - Abnormal; Notable for the following components:   APPearance CLOUDY (*)    Leukocytes,Ua LARGE (*)    WBC, UA >50 (*)    Bacteria, UA FEW (*)    All other components within normal limits  URINE CULTURE  LIPASE, BLOOD  CBC WITH DIFFERENTIAL/PLATELET     EKG None  Radiology CT Renal Stone Study  Result Date: 07/18/2022 CLINICAL DATA:  Right-sided flank pain EXAM: CT ABDOMEN AND PELVIS WITHOUT CONTRAST TECHNIQUE: Multidetector CT imaging of the abdomen and pelvis was performed following the standard protocol without IV contrast. RADIATION DOSE REDUCTION: This exam was performed according to the departmental dose-optimization program which includes automated exposure control, adjustment of the mA and/or kV according to patient size and/or use of iterative reconstruction technique. COMPARISON:  CT abdomen and pelvis 12/04/2015 FINDINGS: Lower chest: No acute abnormality. Hepatobiliary: No focal liver abnormality is  seen. No gallstones, gallbladder wall thickening, or biliary dilatation. Pancreas: Unremarkable. No pancreatic ductal dilatation or surrounding inflammatory changes. Spleen: Normal in size without focal abnormality. Adrenals/Urinary Tract: Adrenal glands are unremarkable. Kidneys are normal, without renal calculi, focal lesion, or hydronephrosis. No perinephric stranding. Bladder is not distended. Stomach/Bowel: Stomach is within normal limits. Appendix appears normal. No evidence of bowel wall thickening, distention, or inflammatory changes. Vascular/Lymphatic: Aortic atherosclerosis. No enlarged abdominal or pelvic lymph nodes. Reproductive: Lobulated uterus compatible with fibroids. Other: No free intraperitoneal fluid or air. Musculoskeletal: No acute abnormality. IMPRESSION: No acute abnormality in the abdomen or pelvis.  No urinary calculi. Fibroid uterus. Aortic Atherosclerosis (ICD10-I70.0). Electronically Signed   By: Minerva Fester M.D.   On: 07/18/2022 22:53    Procedures Procedures    Medications Ordered in ED Medications  sodium chloride 0.9 % bolus 1,000 mL (1,000 mLs Intravenous New Bag/Given 07/19/22 0717)  fentaNYL (SUBLIMAZE) injection 50 mcg (50 mcg Intravenous Given 07/19/22 0715)  cefTRIAXone (ROCEPHIN) 2 g in  sodium chloride 0.9 % 100 mL IVPB (2 g Intravenous New Bag/Given 07/19/22 0725)  ondansetron (ZOFRAN) injection 4 mg (4 mg Intravenous Given 07/19/22 0715)    ED Course/ Medical Decision Making/ A&P                           Medical Decision Making Amount and/or Complexity of Data Reviewed Labs: ordered.  Risk Prescription drug management.   This patient is a 61 y.o. female who presents to the ED for concern of flank pain, dysuria, this involves an extensive number of treatment options, and is a complaint that carries with it a high risk of complications and morbidity. The emergent differential diagnosis prior to evaluation includes, but is not limited to, UTI, pyelonephritis, nephrolithiasis, other intra-abdominal infection, musculoskeletal flank pain lower lobar pneumonia versus other.   This is not an exhaustive differential.   Past Medical History / Co-morbidities / Social History: Hypertension, heart failure, diabetes, obesity  Additional history: Chart reviewed. Pertinent results include: Reviewed lab work, imaging from previous emergency department visits, patient with no evidence of the urine culture on file from her reported 10/13 visit so we will reculture today  Physical Exam: Physical exam performed. The pertinent findings include: Patient is quite tender in the right flank but nontoxic, nonseptic appearing, she is in no acute distress, afebrile.  She is moderately hypertensive in the emergency department with blood pressure 159/91.  Lab Tests: I ordered, and personally interpreted labs.  The pertinent results include: CBC overall unremarkable, no significant leukocytosis, her lipase is normal, CMP is notable for hyperglycemia with glucose 188, her BUN and creatinine are both elevated at 40 and 1.13 respectively, baseline creatinine around 0.9   Imaging Studies: I ordered imaging studies including CT renal stone study. I independently visualized and interpreted imaging  which showed no evidence of kidney stone. I agree with the radiologist interpretation.   Medications: I ordered medication including fentanyl, Zofran, Rocephin, fluid bolus for mild elevated creatinine, UTI. Reevaluation of the patient after these medicines showed that the patient improved. I have reviewed the patients home medicines and have made adjustments as needed.   Disposition: After consideration of the diagnostic results and the patients response to treatment, I feel that patient's clinical presentation is consistent with pyelonephritis not properly treated 2/2 antibiotic noncompliance. I cannot see a culture report but will change abx and discharge with PCP follow up -- will send urine for culture at  this time.   emergency department workup does not suggest an emergent condition requiring admission or immediate intervention beyond what has been performed at this time. The plan is: as above. The patient is safe for discharge and has been instructed to return immediately for worsening symptoms, change in symptoms or any other concerns.  I discussed this case with my attending physician Dr. Ashok Cordia who cosigned this note including patient's presenting symptoms, physical exam, and planned diagnostics and interventions. Attending physician stated agreement with plan or made changes to plan which were implemented.    Final Clinical Impression(s) / ED Diagnoses Final diagnoses:  Pyelonephritis    Rx / DC Orders ED Discharge Orders          Ordered    cefadroxil (DURICEF) 500 MG capsule  2 times daily        07/19/22 0910    ondansetron (ZOFRAN) 4 MG tablet  Every 6 hours        07/19/22 0910              Sharnay Cashion, Joesph Fillers, PA-C 07/19/22 0913    Lajean Saver, MD 07/19/22 1137

## 2022-07-20 LAB — URINE CULTURE: Culture: <10000 — AB

## 2022-11-05 ENCOUNTER — Emergency Department (HOSPITAL_COMMUNITY): Payer: Medicare HMO

## 2022-11-05 ENCOUNTER — Other Ambulatory Visit: Payer: Self-pay

## 2022-11-05 ENCOUNTER — Emergency Department (HOSPITAL_COMMUNITY)
Admission: EM | Admit: 2022-11-05 | Discharge: 2022-11-05 | Disposition: A | Discharge status: Home / Self Care | Payer: Medicare HMO | Attending: Emergency Medicine | Admitting: Emergency Medicine

## 2022-11-05 DIAGNOSIS — M25562 Pain in left knee: Secondary | ICD-10-CM | POA: Insufficient documentation

## 2022-11-05 DIAGNOSIS — Z79899 Other long term (current) drug therapy: Secondary | ICD-10-CM | POA: Diagnosis not present

## 2022-11-05 DIAGNOSIS — Z7984 Long term (current) use of oral hypoglycemic drugs: Secondary | ICD-10-CM | POA: Insufficient documentation

## 2022-11-05 DIAGNOSIS — R600 Localized edema: Secondary | ICD-10-CM | POA: Diagnosis not present

## 2022-11-05 DIAGNOSIS — M25561 Pain in right knee: Secondary | ICD-10-CM | POA: Insufficient documentation

## 2022-11-05 MED ORDER — DICLOFENAC SODIUM 1 % EX GEL: 2.0000 g | Freq: Four times a day (QID) | CUTANEOUS | 0 refills | Status: DC

## 2022-11-05 MED ORDER — IBUPROFEN 400 MG PO TABS
600.0000 mg | ORAL_TABLET | Freq: Once | ORAL | Status: AC
  Administered 2022-11-05: 600 mg via ORAL
  Filled 2022-11-05: qty 1

## 2022-11-05 MED ORDER — DICLOFENAC SODIUM 1 % EX GEL
4.0000 g | Freq: Once | CUTANEOUS | Status: AC
  Administered 2022-11-05: 4 g via TOPICAL
  Filled 2022-11-05: qty 100

## 2022-11-05 NOTE — Discharge Instructions (Addendum)
Follow-up with primary care provider or see orthopedics. Apply Voltaren gel as prescribed for pain. This is an anti-inflammatory pain medication.

## 2022-11-05 NOTE — ED Triage Notes (Signed)
Chronic Bilateral knee pain worse x 1 week denies injury. No meds PTA.

## 2022-11-05 NOTE — ED Provider Notes (Signed)
[facility] Provider Note   CSN: [id number] Arrival date & time: 11/05/22  1949     History  Chief Complaint  Patient presents with   Knee Pain    Debbie Davis is a 62 y.o. female.  62 year old female presents with complaint of pain anterior bilateral knees for the past week without falls or injury.  Pain is worse with touching her knees or bearing weight.  Patient is taking Tylenol with some relief of her pain.  Denies pain in hips or ankles.       Home Medications Prior to Admission medications   Medication Sig Start Date End Date Taking? Authorizing Provider  diclofenac Sodium (VOLTAREN) 1 % GEL Apply 2 g topically 4 (four) times daily. 11/05/22  Yes Tacy Learn, PA-C  albuterol (PROVENTIL HFA;VENTOLIN HFA) 108 (90 Base) MCG/ACT inhaler Inhale 1-2 puffs into the lungs every 4 (four) hours as needed for wheezing or shortness of breath. 08/12/16   Gloriann Loan, PA-C  amLODipine (NORVASC) 10 MG tablet Take 1 tablet (10 mg total) by mouth daily. Patient not taking: Reported on 03/20/2022 07/12/20   Malvin Johns, MD  amLODipine (NORVASC) 10 MG tablet Take 1 tablet (10 mg total) by mouth daily. 03/20/22 04/19/22  Cherlynn June B, PA-C  cefadroxil (DURICEF) 500 MG capsule Take 1 capsule (500 mg total) by mouth 2 (two) times daily. 07/19/22   Prosperi, Christian H, PA-C  hydrochlorothiazide (HYDRODIURIL) 25 MG tablet Take 1 tablet (25 mg total) by mouth daily. Patient not taking: Reported on 03/20/2022 07/12/20   Malvin Johns, MD  hydrochlorothiazide (HYDRODIURIL) 25 MG tablet Take 1 tablet (25 mg total) by mouth daily. 03/20/22 04/19/22  Dorothyann Peng, PA-C  HYDROcodone-acetaminophen (NORCO/VICODIN) 5-325 MG tablet Take 1 tablet by mouth every 6 (six) hours as needed for severe pain. Patient not taking: Reported on 03/20/2022 09/10/21   Ripley Fraise, MD  lidocaine (LIDODERM) 5 % Place 1 patch onto the skin daily. Remove & Discard  patch within 12 hours or as directed by MD Patient not taking: Reported on 03/20/2022 03/25/19   Nuala Alpha A, PA-C  metFORMIN (GLUCOPHAGE) 500 MG tablet Take 1 tablet (500 mg total) by mouth 2 (two) times daily with a meal. Patient not taking: Reported on 03/20/2022 07/12/20   Malvin Johns, MD  metoprolol succinate (TOPROL-XL) 100 MG 24 hr tablet Take 1 tablet (100 mg total) by mouth daily. Take with or immediately following a meal Patient not taking: Reported on 03/20/2022 07/12/20   Malvin Johns, MD  Multiple Vitamin (MULTIVITAMIN WITH MINERALS) TABS tablet Take 1 tablet by mouth daily.     [provider]  naproxen sodium (ANAPROX) 220 MG tablet Take 220 mg by mouth 2 (two) times daily as needed (for pain).     [provider]  ondansetron (ZOFRAN) 4 MG tablet Take 1 tablet (4 mg total) by mouth every 6 (six) hours. 07/19/22   Prosperi, Christian H, PA-C  sodium chloride (OCEAN) 0.65 % SOLN nasal spray Place 1 spray into both nostrils as needed for congestion. Patient not taking: Reported on 03/20/2022 09/25/21   Mesner, Corene Cornea, MD  triamcinolone (NASACORT) 55 MCG/ACT AERO nasal inhaler Place 2 sprays into the nose daily. 04/12/22   Brunetta Jeans, PA-C  umeclidinium-vilanterol (ANORO ELLIPTA) 62.5-25 MCG/INH AEPB Inhale 1 puff into the lungs daily as needed (shortness of breath).     [provider]  valsartan (DIOVAN) 160 MG tablet Take 160 mg by  mouth daily.    [provider]      Allergies    Codeine and Morphine and related    Review of Systems   Review of Systems Negative except as per HPI Physical Exam Updated Vital Signs BP 124/81   Pulse 84   Temp 97.8 F (36.6 C) (Oral)   Resp 17   Ht 5' (1.524 m)   Wt 72.6 kg   SpO2 100%   BMI 31.25 kg/m  Physical Exam Vitals and nursing note reviewed.  Constitutional:      General: She is not in acute distress.    Appearance: She is well-developed. She is not diaphoretic.  HENT:      Head: Normocephalic and atraumatic.  Cardiovascular:     Pulses: Normal pulses.  Pulmonary:     Effort: Pulmonary effort is normal.  Musculoskeletal:     Right lower leg: Edema present.     Left lower leg: Edema present.     Comments: No pain with logroll of hips or range of motion of ankles.  Able to flex knees to 90 degrees and fully extend.  Has notable tenderness to anterior bilateral knees.  No erythema, no effusion, skin intact.   Skin:    General: Skin is warm and dry.     Findings: No erythema or rash.  Neurological:     Mental Status: She is alert and oriented to person, place, and time.     Sensory: No sensory deficit.  Psychiatric:        Behavior: Behavior normal.     ED Results / Procedures / Treatments   Labs (all labs ordered are listed, but only abnormal results are displayed) Labs Reviewed - No data to display  EKG None  Radiology DG Knee Complete 4 Views Left  Result Date: 11/05/2022 CLINICAL DATA:  Knee pain EXAM: LEFT KNEE - COMPLETE 4+ VIEW COMPARISON:  None Available. FINDINGS: No evidence of fracture, dislocation, or joint effusion. No evidence of arthropathy or other focal bone abnormality. Soft tissues are unremarkable. IMPRESSION: Negative. Electronically Signed   By: Donavan Foil M.D.   On: 11/05/2022 22:01   DG Knee Complete 4 Views Right  Result Date: 11/05/2022 CLINICAL DATA:  Chronic knee pain EXAM: RIGHT KNEE - COMPLETE 4+ VIEW COMPARISON:  05/03/2014 FINDINGS: No fracture or malalignment. No sizable knee effusion. The joint spaces appear patent IMPRESSION: Negative. Electronically Signed   By: Donavan Foil M.D.   On: 11/05/2022 22:01    Procedures Procedures    Medications Ordered in ED Medications  diclofenac Sodium (VOLTAREN) 1 % topical gel 4 g (has no administration in time range)  ibuprofen (ADVIL) tablet 600 mg (600 mg Oral Given 11/05/22 2112)    ED Course/ Medical Decision Making/ A&P                             Medical  Decision Making  62 year old female with complaint of bilateral knee pain as above.  Found have tenderness with palpation anterior bilateral knees, not along the joint lines, is able to flex and extend, skin normal, no effusion.  No history of trauma.  X-ray of bilateral knees is ordered and read myself are negative for acute bony abnormality.  Plan is to treat with Voltaren gel, recommend follow-up with primary care provider or see orthopedics, referral provided if needed.        Final Clinical Impression(s) / ED Diagnoses Final diagnoses:  Acute pain of both knees    Rx / DC Orders ED Discharge Orders          Ordered    diclofenac Sodium (VOLTAREN) 1 % GEL  4 times daily        11/05/22 2246              Tacy Learn, PA-C 11/05/22 2248    Dorie Rank, MD 11/06/22 [phone]

## 2022-11-05 NOTE — ED Provider Triage Note (Signed)
Emergency Medicine Provider Triage Evaluation Note  Debbie Davis , a 62 y.o. female  was evaluated in triage.  Pt complains of bilateral knee pain for the past week. Denies any injury. Has been trying Tylenol without much relief. No numbness or tingling.  Review of Systems  Positive:  Negative:   Physical Exam  BP 124/81   Pulse 84   Temp 97.8 F (36.6 C) (Oral)   Resp 17   Ht 5' (1.524 m)   Wt 72.6 kg   SpO2 100%   BMI 31.25 kg/m  Gen:   Awake, no distress   Resp:  Normal effort  MSK:   Moves extremities without difficulty  Other:  Compartments are soft. No overlying erythema or warmth. Can flex and extend the knee bilaterally with pain.   Medical Decision Making  Medically screening exam initiated at 9:07 PM.  Appropriate orders placed.  Debbie Davis was informed that the remainder of the evaluation will be completed by another provider, this initial triage assessment does not replace that evaluation, and the importance of remaining in the ED until their evaluation is complete.  XR ordered   Sherrell Puller, PA-C 11/05/22 2108

## 2023-01-22 ENCOUNTER — Ambulatory Visit: Payer: Medicare HMO | Admitting: Physician Assistant

## 2023-01-22 ENCOUNTER — Encounter: Payer: Self-pay | Admitting: Physician Assistant

## 2023-01-22 VITALS — BP 157/94 | HR 112 | Ht 60.0 in

## 2023-01-22 DIAGNOSIS — R051 Acute cough: Secondary | ICD-10-CM | POA: Diagnosis not present

## 2023-01-22 DIAGNOSIS — R600 Localized edema: Secondary | ICD-10-CM | POA: Diagnosis not present

## 2023-01-22 DIAGNOSIS — I5032 Chronic diastolic (congestive) heart failure: Secondary | ICD-10-CM

## 2023-01-22 MED ORDER — BENZONATATE 100 MG PO CAPS: ORAL_CAPSULE | ORAL | 0 refills | Status: DC

## 2023-01-22 MED ORDER — FUROSEMIDE 20 MG PO TABS: 20.0000 mg | ORAL_TABLET | Freq: Every day | ORAL | 0 refills | Status: DC

## 2023-01-22 NOTE — Progress Notes (Unsigned)
New Patient Office Visit  Subjective    Patient ID: Debbie Davis, female    DOB: [birth year]  Age: 62 y.o. MRN: [id number]  CC:  Chief Complaint  Patient presents with   Foot Swelling    X 2-3 days    Cold Exposure    HPI Debbie Davis writes that she has been experiencing swelling in both feet and ankles for the last 3 days.  States that she believes it is because she is unable to elevate her feet at night, states that [facility] and has to sleep sitting at the table.  States that if she is able to lay down the swelling does improve.  Does endorse heart failure, denies shortness of breath.  States that she has been experiencing a dry cough over the last couple of days, states that she believes it is due to allergies and dust at the resource center.  Unsure of sick contacts.  Denies fever, chills, body aches, any other upper respiratory symptoms.  Has been using Robitussin with a small amount of relief.    [facility], states that she was last seen there approximately 2 months ago.  States that she has not taken her blood pressure medications today, does not check her blood pressure at home.  Denies hypertensive symptoms.   Outpatient Encounter Medications as of 01/22/2023  Medication Sig   albuterol (PROVENTIL HFA;VENTOLIN HFA) 108 (90 Base) MCG/ACT inhaler Inhale 1-2 puffs into the lungs every 4 (four) hours as needed for wheezing or shortness of breath.   amLODipine (NORVASC) 10 MG tablet Take 1 tablet (10 mg total) by mouth daily.   benzonatate (TESSALON) 100 MG capsule Take 1-2 caps PO TID PRN   cefadroxil (DURICEF) 500 MG capsule Take 1 capsule (500 mg total) by mouth 2 (two) times daily.   diclofenac Sodium (VOLTAREN) 1 % GEL Apply 2 g topically 4 (four) times daily.   furosemide (LASIX) 20 MG tablet Take 1 tablet (20 mg total) by mouth daily.    hydrochlorothiazide (HYDRODIURIL) 25 MG tablet Take 1 tablet (25 mg total) by mouth daily.   HYDROcodone-acetaminophen (NORCO/VICODIN) 5-325 MG tablet Take 1 tablet by mouth every 6 (six) hours as needed for severe pain.   lidocaine (LIDODERM) 5 % Place 1 patch onto the skin daily. Remove & Discard patch within 12 hours or as directed by MD   metFORMIN (GLUCOPHAGE) 500 MG tablet Take 1 tablet (500 mg total) by mouth 2 (two) times daily with a meal.   metoprolol succinate (TOPROL-XL) 100 MG 24 hr tablet Take 1 tablet (100 mg total) by mouth daily. Take with or immediately following a meal   Multiple Vitamin (MULTIVITAMIN WITH MINERALS) TABS tablet Take 1 tablet by mouth daily.    naproxen sodium (ANAPROX) 220 MG tablet Take 220 mg by mouth 2 (two) times daily as needed (for pain).    ondansetron (ZOFRAN) 4 MG tablet Take 1 tablet (4 mg total) by mouth every 6 (six) hours.   sodium chloride (OCEAN) 0.65 % SOLN nasal spray Place 1 spray into both nostrils as needed for congestion.   triamcinolone (NASACORT) 55 MCG/ACT AERO nasal inhaler Place 2 sprays into the nose daily.   umeclidinium-vilanterol (ANORO ELLIPTA) 62.5-25 MCG/INH AEPB Inhale 1 puff into the lungs daily as needed (shortness of breath).    valsartan (DIOVAN) 160 MG tablet Take 160  mg by mouth daily.   amLODipine (NORVASC) 10 MG tablet Take 1 tablet (10 mg total) by mouth daily.   hydrochlorothiazide (HYDRODIURIL) 25 MG tablet Take 1 tablet (25 mg total) by mouth daily.   No facility-administered encounter medications on file as of 01/22/2023.    Past Medical History:  Diagnosis Date   Atherosclerosis of aorta 11/25/2016   Chronic diastolic heart failure 11/25/2016   Diabetes type 2, uncontrolled 08/22/2016   Hypertension    Hypertensive heart disease 08/20/2016   Obesity (BMI 30-39.9) 11/25/2016    Past Surgical History:  Procedure Laterality Date   KIDNEY SURGERY Right    removal of kidney stone   RIGHT/LEFT HEART CATH AND  CORONARY ANGIOGRAPHY N/A 11/26/2016   Procedure: Right/Left Heart Cath and Coronary Angiography;  Surgeon: Marykay Lex, MD;  Location: [facility] INVASIVE CV LAB;  Service: Cardiovascular;  Laterality: N/A;   TUBAL LIGATION      Family History  Problem Relation Age of Onset   Cancer Mother    Heart disease Father    Diabetes Sister    Diabetes Brother    Diabetes Sister    Diabetes Sister    Diabetes Sister     Social History   Socioeconomic History   Marital status: Single    Spouse name: Not on file   Number of children: Not on file   Years of education: Not on file   Highest education level: Not on file  Occupational History   Not on file  Tobacco Use   Smoking status: Every Day    Packs/day: 0.50    Years: 30.00    Additional pack years: 0.00    Total pack years: 15.00    Types: Cigarettes   Smokeless tobacco: Never  Vaping Use   Vaping Use: Never used  Substance and Sexual Activity   Alcohol use: Yes   Drug use: No   Sexual activity: Not on file  Other Topics Concern   Not on file  Social History Narrative   Not on file   Social Determinants of Health   Financial Resource Strain: Not on file  Food Insecurity: Not on file  Transportation Needs: Not on file  Physical Activity: Not on file  Stress: Not on file  Social Connections: Not on file  Intimate Partner Violence: Not on file    Review of Systems  Constitutional:  Negative for chills and fever.  HENT:  Negative for congestion, ear pain, sinus pain and sore throat.   Eyes: Negative.   Respiratory:  Positive for cough. Negative for sputum production and shortness of breath.   Cardiovascular:  Negative for chest pain.  Gastrointestinal:  Negative for nausea and vomiting.  Genitourinary: Negative.   Musculoskeletal: Negative.   Skin: Negative.   Neurological: Negative.   Endo/Heme/Allergies: Negative.   Psychiatric/Behavioral: Negative.          Objective    BP (!) 157/94 (BP Location: Left  Arm, Patient Position: Sitting, Cuff Size: Large)   Pulse (!) 112   Ht 5' (1.524 m)   BMI 31.25 kg/m   Physical Exam Vitals and nursing note reviewed.  Constitutional:      Appearance: Normal appearance.  HENT:     Head: Normocephalic and atraumatic.     Right Ear: Tympanic membrane, ear canal and external ear normal.     Left Ear: Tympanic membrane, ear canal and external ear normal.     Nose: Nose normal.     Mouth/Throat:  Mouth: Mucous membranes are moist.     Pharynx: Oropharynx is clear.  Eyes:     Extraocular Movements: Extraocular movements intact.     Conjunctiva/sclera: Conjunctivae normal.     Pupils: Pupils are equal, round, and reactive to light.  Cardiovascular:     Rate and Rhythm: Regular rhythm. Tachycardia present.     Pulses: Normal pulses.          Dorsalis pedis pulses are 2+ on the right side and 2+ on the left side.       Posterior tibial pulses are 2+ on the right side and 2+ on the left side.     Heart sounds: Normal heart sounds.  Pulmonary:     Effort: Pulmonary effort is normal.     Breath sounds: Normal breath sounds. No wheezing.  Musculoskeletal:        General: Normal range of motion.     Cervical back: Normal range of motion and neck supple.     Right lower leg: 2+ Edema present.     Left lower leg: 2+ Edema present.  Skin:    General: Skin is warm and dry.  Neurological:     General: No focal deficit present.     Mental Status: She is alert and oriented to person, place, and time.  Psychiatric:        Mood and Affect: Mood normal.        Behavior: Behavior normal.        Thought Content: Thought content normal.        Judgment: Judgment normal.         Assessment & Plan:   Problem List Items Addressed This Visit       Cardiovascular and Mediastinum   Chronic diastolic heart failure (Chronic)   Relevant Medications   furosemide (LASIX) 20 MG tablet   Other Relevant Orders   Basic Metabolic Panel   Brain natriuretic  peptide   Other Visit Diagnoses     Acute cough    -  Primary   Relevant Medications   benzonatate (TESSALON) 100 MG capsule   Localized edema       Relevant Medications   furosemide (LASIX) 20 MG tablet     1. Acute cough Trial Tessalon Perles.  Patient education given on supportive care - benzonatate (TESSALON) 100 MG capsule; Take 1-2 caps PO TID PRN  Dispense: 20 capsule; Refill: 0  2. Chronic diastolic heart failure - furosemide (LASIX) 20 MG tablet; Take 1 tablet (20 mg total) by mouth daily.  Dispense: 10 tablet; Refill: 0 - Basic Metabolic Panel - Brain natriuretic peptide  3. Localized edema Trial Lasix once daily for 10 days.  Patient encouraged to return to the mobile unit in 2 weeks or follow-up with primary care provider for further evaluation.  Patient education given on supportive care, red flags given for prompt reevaluation. - furosemide (LASIX) 20 MG tablet; Take 1 tablet (20 mg total) by mouth daily.  Dispense: 10 tablet; Refill: 0   I have reviewed the patient's medical history (PMH, PSH, Social History, Family History, Medications, and allergies) , and have been updated if relevant. I spent 30 minutes reviewing chart and  face to face time with patient.     Return in about 2 weeks (around 02/05/2023) for with [facility].   Kasandra Knudsen Mayers, PA-C

## 2023-01-22 NOTE — Patient Instructions (Signed)
You are wanting to take Lasix 20 mg once a day for the next 10 days.  I encourage you to keep your feet elevated when able, wear compression stockings.  We will call you with your lab results as soon as it is available.  To help with your cough, you can use Tessalon Perles as directed.  I sent this to your pharmacy.  Please return to the mobile unit in 2 weeks for follow-up.  Roney Jaffe, PA-C Physician Assistant [facility] [email or web address]   Edema  Edema is an abnormal buildup of fluids in the body tissues and under the skin. Swelling of the legs, feet, and ankles is a common symptom that becomes more likely as you get older. Swelling is also common in looser tissues, such as around the eyes. Pressing on the area may make a temporary dent in your skin (pitting edema). This fluid may also accumulate in your lungs (pulmonary edema). There are many possible causes of edema. Eating too much salt (sodium) and being on your feet or sitting for a long time can cause edema in your legs, feet, and ankles. Common causes of edema include: Certain medical conditions, such as heart failure, liver or kidney disease, and cancer. Weak leg blood vessels. An injury. Pregnancy. Medicines. Being obese. Low protein levels in the blood. Hot weather may make edema worse. Edema is usually painless. Your skin may look swollen or shiny. Follow these instructions at home: Medicines Take over-the-counter and prescription medicines only as told by your health care provider. Your health care provider may prescribe a medicine to help your body get rid of extra water (diuretic). Take this medicine if you are told to take it. Eating and drinking Eat a low-salt (low-sodium) diet to reduce fluid as told by your health care provider. Sometimes, eating less salt may reduce swelling. Depending on the cause of your swelling, you may need to limit how much fluid  you drink (fluid restriction). General instructions Raise (elevate) the injured area above the level of your heart while you are sitting or lying down. Do not sit still or stand for long periods of time. Do not wear tight clothing. Do not wear garters on your upper legs. Exercise your legs to get your circulation going. This helps to move the fluid back into your blood vessels, and it may help the swelling go down. Wear compression stockings as told by your health care provider. These stockings help to prevent blood clots and reduce swelling in your legs. It is important that these are the correct size. These stockings should be prescribed by your health care provider to prevent possible injuries. If elastic bandages or wraps are recommended, use them as told by your health care provider. Contact a health care provider if: Your edema does not get better with treatment. You have heart, liver, or kidney disease and have symptoms of edema. You have sudden and unexplained weight gain. Get help right away if: You develop shortness of breath or chest pain. You cannot breathe when you lie down. You develop pain, redness, or warmth in the swollen areas. You have heart, liver, or kidney disease and suddenly get edema. You have a fever and your symptoms suddenly get worse. These symptoms may be an emergency. Get help right away. Call 911. Do not wait to see if the symptoms will go away. Do not drive yourself to the hospital. Summary Edema is an abnormal buildup of fluids in the body tissues and under the  skin. Eating too much salt (sodium)and being on your feet or sitting for a long time can cause edema in your legs, feet, and ankles. Raise (elevate) the injured area above the level of your heart while you are sitting or lying down. Follow your health care provider's instructions about diet and how much fluid you can drink. This information is not intended to replace advice given to you by your  health care provider. Make sure you discuss any questions you have with your health care provider. Document Revised: 05/21/2021 Document Reviewed: 05/21/2021 Elsevier Patient Education  2023 [employer].

## 2023-01-23 ENCOUNTER — Encounter: Payer: Self-pay | Admitting: Physician Assistant

## 2023-01-23 LAB — BASIC METABOLIC PANEL
BUN/Creatinine Ratio: 26 (ref 12–28)
BUN: 27 mg/dL (ref 8–27)
CO2: 22 mmol/L (ref 20–29)
Calcium: 8.7 mg/dL (ref 8.7–10.3)
Chloride: 105 mmol/L (ref 96–106)
Creatinine, Ser: 1.02 mg/dL — ABNORMAL HIGH (ref 0.57–1.00)
Glucose: 235 mg/dL — ABNORMAL HIGH (ref 70–99)
Potassium: 4.2 mmol/L (ref 3.5–5.2)
Sodium: 140 mmol/L (ref 134–144)
eGFR: 63 mL/min/{1.73_m2} (ref >59)

## 2023-01-23 LAB — BRAIN NATRIURETIC PEPTIDE: BNP: 8.3 pg/mL (ref 0.0–100.0)

## 2023-01-26 ENCOUNTER — Emergency Department (HOSPITAL_COMMUNITY): Payer: Medicare HMO

## 2023-01-26 ENCOUNTER — Encounter (HOSPITAL_COMMUNITY): Payer: Self-pay

## 2023-01-26 ENCOUNTER — Emergency Department (HOSPITAL_COMMUNITY)
Admission: EM | Admit: 2023-01-26 | Discharge: 2023-01-26 | Disposition: A | Discharge status: Home / Self Care | Payer: Medicare HMO | Attending: Emergency Medicine | Admitting: Emergency Medicine

## 2023-01-26 DIAGNOSIS — I5032 Chronic diastolic (congestive) heart failure: Secondary | ICD-10-CM | POA: Insufficient documentation

## 2023-01-26 DIAGNOSIS — I110 Hypertensive heart disease with heart failure: Secondary | ICD-10-CM | POA: Diagnosis not present

## 2023-01-26 DIAGNOSIS — E1140 Type 2 diabetes mellitus with diabetic neuropathy, unspecified: Secondary | ICD-10-CM | POA: Diagnosis not present

## 2023-01-26 DIAGNOSIS — R600 Localized edema: Secondary | ICD-10-CM | POA: Insufficient documentation

## 2023-01-26 DIAGNOSIS — D649 Anemia, unspecified: Secondary | ICD-10-CM | POA: Insufficient documentation

## 2023-01-26 DIAGNOSIS — Z7984 Long term (current) use of oral hypoglycemic drugs: Secondary | ICD-10-CM | POA: Insufficient documentation

## 2023-01-26 DIAGNOSIS — Z79899 Other long term (current) drug therapy: Secondary | ICD-10-CM | POA: Insufficient documentation

## 2023-01-26 DIAGNOSIS — M7989 Other specified soft tissue disorders: Secondary | ICD-10-CM | POA: Diagnosis present

## 2023-01-26 DIAGNOSIS — M792 Neuralgia and neuritis, unspecified: Secondary | ICD-10-CM

## 2023-01-26 LAB — COMPREHENSIVE METABOLIC PANEL
ALT: 17 U/L (ref 0–44)
AST: 17 U/L (ref 15–41)
Albumin: 3.4 g/dL — ABNORMAL LOW (ref 3.5–5.0)
Alkaline Phosphatase: 71 U/L (ref 38–126)
Anion gap: 12 (ref 5–15)
BUN: 24 mg/dL — ABNORMAL HIGH (ref 8–23)
CO2: 23 mmol/L (ref 22–32)
Calcium: 8.9 mg/dL (ref 8.9–10.3)
Chloride: 103 mmol/L (ref 98–111)
Creatinine, Ser: 1.07 mg/dL — ABNORMAL HIGH (ref 0.44–1.00)
GFR, Estimated: 59 mL/min — ABNORMAL LOW (ref >60)
Glucose, Bld: 143 mg/dL — ABNORMAL HIGH (ref 70–99)
Potassium: 3.9 mmol/L (ref 3.5–5.1)
Sodium: 138 mmol/L (ref 135–145)
Total Bilirubin: 0.3 mg/dL (ref 0.3–1.2)
Total Protein: 7.3 g/dL (ref 6.5–8.1)

## 2023-01-26 LAB — CBC WITH DIFFERENTIAL/PLATELET
Abs Immature Granulocytes: 0.04 10*3/uL (ref 0.00–0.07)
Basophils Absolute: 0.1 10*3/uL (ref 0.0–0.1)
Basophils Relative: 1 %
Eosinophils Absolute: 0.2 10*3/uL (ref 0.0–0.5)
Eosinophils Relative: 2 %
HCT: 36.8 % (ref 36.0–46.0)
Hemoglobin: 11.5 g/dL — ABNORMAL LOW (ref 12.0–15.0)
Immature Granulocytes: 0 %
Lymphocytes Relative: 22 %
Lymphs Abs: 2.1 10*3/uL (ref 0.7–4.0)
MCH: 27.3 pg (ref 26.0–34.0)
MCHC: 31.3 g/dL (ref 30.0–36.0)
MCV: 87.4 fL (ref 80.0–100.0)
Monocytes Absolute: 0.8 10*3/uL (ref 0.1–1.0)
Monocytes Relative: 9 %
Neutro Abs: 6.3 10*3/uL (ref 1.7–7.7)
Neutrophils Relative %: 66 %
Platelets: 237 10*3/uL (ref 150–400)
RBC: 4.21 MIL/uL (ref 3.87–5.11)
RDW: 15.4 % (ref 11.5–15.5)
WBC: 9.5 10*3/uL (ref 4.0–10.5)
nRBC: 0 % (ref 0.0–0.2)

## 2023-01-26 LAB — BRAIN NATRIURETIC PEPTIDE: B Natriuretic Peptide: 14.7 pg/mL (ref 0.0–100.0)

## 2023-01-26 MED ORDER — GABAPENTIN 100 MG PO CAPS: 100.0000 mg | ORAL_CAPSULE | Freq: Three times a day (TID) | ORAL | 0 refills | Status: DC

## 2023-01-26 MED ORDER — ACETAMINOPHEN 500 MG PO TABS
1000.0000 mg | ORAL_TABLET | Freq: Once | ORAL | Status: AC
  Administered 2023-01-26: 1000 mg via ORAL
  Filled 2023-01-26: qty 2

## 2023-01-26 MED ORDER — GABAPENTIN 100 MG PO CAPS
100.0000 mg | ORAL_CAPSULE | Freq: Once | ORAL | Status: AC
  Administered 2023-01-26: 100 mg via ORAL
  Filled 2023-01-26: qty 1

## 2023-01-26 NOTE — ED Triage Notes (Signed)
Pt presents via EMS from the [facility] with c/o bilateral feet and leg pain 9/10. Pt said the pain started 2 days ago, no injury. Pt is a diabetic, CBG 179 for EMS. BP 148/82, 95% RA, 74HR for EMS. Pt is alert and oriented per EMS.

## 2023-01-26 NOTE — Discharge Instructions (Addendum)
Wear the compression stockings/TED hose to help with your swelling.  You appear to have neuropathy in your feet, likely from your diabetes and her being started on a medicine called gabapentin. This can cause side effects such as dizziness, drowsiness, etc.  Follow-up with your doctor for titration of this medication.  Develop shortness of breath, new or worsening pain, new or worsening swelling, fever, or any other new/concerning symptoms and return to the ER or call 911.

## 2023-01-26 NOTE — ED Provider Notes (Signed)
[place] EMERGENCY DEPARTMENT AT [facility] Provider Note   CSN: [id number] Arrival date & time: 01/26/23  1331     History  Chief Complaint  Patient presents with   Leg Pain    Debbie Davis is a 62 y.o. female.  HPI 62 year old female with a history of hypertension, type 2 diabetes, chronic diastolic heart failure presents with leg swelling and pain.  Both feet and lower legs have been swollen for about a week.  She is also had a cough during that time but no dyspnea.  Doctor came by where she lives at the [facility] and gave her furosemide and Tessalon.  She has been urinating more often.  Her legs are still swollen and painful to walk on.  The pain is primarily in her feels like a burning sensation.  No shortness of breath, chest pain, fever.  Home Medications Prior to Admission medications   Medication Sig Start Date End Date Taking? Authorizing Provider  gabapentin (NEURONTIN) 100 MG capsule Take 1 capsule (100 mg total) by mouth 3 (three) times daily. 01/26/23  Yes Pricilla Loveless, MD  albuterol (PROVENTIL HFA;VENTOLIN HFA) 108 (90 Base) MCG/ACT inhaler Inhale 1-2 puffs into the lungs every 4 (four) hours as needed for wheezing or shortness of breath. 08/12/16   Cheri Fowler, PA-C  amLODipine (NORVASC) 10 MG tablet Take 1 tablet (10 mg total) by mouth daily. 07/12/20   Rolan Bucco, MD  amLODipine (NORVASC) 10 MG tablet Take 1 tablet (10 mg total) by mouth daily. 03/20/22 04/19/22  Darrick Grinder, PA-C  benzonatate (TESSALON) 100 MG capsule Take 1-2 caps PO TID PRN 01/22/23   Mayers, Cari S, PA-C  cefadroxil (DURICEF) 500 MG capsule Take 1 capsule (500 mg total) by mouth 2 (two) times daily. 07/19/22   Prosperi, Christian H, PA-C  diclofenac Sodium (VOLTAREN) 1 % GEL Apply 2 g topically 4 (four) times daily. 11/05/22   Jeannie Fend, PA-C  furosemide (LASIX) 20 MG tablet Take 1 tablet (20 mg total) by mouth daily. 01/22/23   Mayers, Cari S, PA-C  hydrochlorothiazide  (HYDRODIURIL) 25 MG tablet Take 1 tablet (25 mg total) by mouth daily. 07/12/20   Rolan Bucco, MD  hydrochlorothiazide (HYDRODIURIL) 25 MG tablet Take 1 tablet (25 mg total) by mouth daily. 03/20/22 04/19/22  Darrick Grinder, PA-C  HYDROcodone-acetaminophen (NORCO/VICODIN) 5-325 MG tablet Take 1 tablet by mouth every 6 (six) hours as needed for severe pain. 09/10/21   Zadie Rhine, MD  lidocaine (LIDODERM) 5 % Place 1 patch onto the skin daily. Remove & Discard patch within 12 hours or as directed by MD 03/25/19   Bill Salinas, PA-C  metFORMIN (GLUCOPHAGE) 500 MG tablet Take 1 tablet (500 mg total) by mouth 2 (two) times daily with a meal. 07/12/20   Rolan Bucco, MD  metoprolol succinate (TOPROL-XL) 100 MG 24 hr tablet Take 1 tablet (100 mg total) by mouth daily. Take with or immediately following a meal 07/12/20   Rolan Bucco, MD  Multiple Vitamin (MULTIVITAMIN WITH MINERALS) TABS tablet Take 1 tablet by mouth daily.     [provider]  naproxen sodium (ANAPROX) 220 MG tablet Take 220 mg by mouth 2 (two) times daily as needed (for pain).     [provider]  ondansetron (ZOFRAN) 4 MG tablet Take 1 tablet (4 mg total) by mouth every 6 (six) hours. 07/19/22   Prosperi, Christian H, PA-C  sodium chloride (OCEAN) 0.65 % SOLN nasal spray Place 1  spray into both nostrils as needed for congestion. 09/25/21   Mesner, Barbara Cower, MD  triamcinolone (NASACORT) 55 MCG/ACT AERO nasal inhaler Place 2 sprays into the nose daily. 04/12/22   Waldon Merl, PA-C  umeclidinium-vilanterol (ANORO ELLIPTA) 62.5-25 MCG/INH AEPB Inhale 1 puff into the lungs daily as needed (shortness of breath).     [provider]  valsartan (DIOVAN) 160 MG tablet Take 160 mg by mouth daily.    [provider]      Allergies    Codeine and Morphine and related    Review of Systems   Review of Systems  Respiratory:  Positive for cough. Negative for shortness of breath.    Cardiovascular:  Positive for leg swelling. Negative for chest pain.  Gastrointestinal:  Negative for abdominal pain.    Physical Exam Updated Vital Signs BP 133/82   Pulse 93   Temp 98.8 F (37.1 C) (Oral)   Resp 20   SpO2 100%  Physical Exam Vitals and nursing note reviewed.  Constitutional:      Appearance: She is well-developed.  HENT:     Head: Normocephalic and atraumatic.  Cardiovascular:     Rate and Rhythm: Normal rate and regular rhythm.     Pulses:          Dorsalis pedis pulses are 2+ on the right side and 2+ on the left side.     Heart sounds: Normal heart sounds.  Pulmonary:     Effort: Pulmonary effort is normal.     Breath sounds: Normal breath sounds. No wheezing, rhonchi or rales.  Abdominal:     General: There is no distension.     Palpations: Abdomen is soft.     Tenderness: There is no abdominal tenderness.  Musculoskeletal:     Right lower leg: Edema present.     Left lower leg: Edema present.     Right foot: Swelling present.     Left foot: Swelling present.     Comments: No focal tenderness to bilateral feet/lower legs. Overall mild tenderness while palpating edema. Both legs are warm/well perfused.  Skin:    General: Skin is warm and dry.  Neurological:     Mental Status: She is alert.     ED Results / Procedures / Treatments   Labs (all labs ordered are listed, but only abnormal results are displayed) Labs Reviewed  COMPREHENSIVE METABOLIC PANEL - Abnormal; Notable for the following components:      Result Value   Glucose, Bld 143 (*)    BUN 24 (*)    Creatinine, Ser 1.07 (*)    Albumin 3.4 (*)    GFR, Estimated 59 (*)    All other components within normal limits  CBC WITH DIFFERENTIAL/PLATELET - Abnormal; Notable for the following components:   Hemoglobin 11.5 (*)    All other components within normal limits  BRAIN NATRIURETIC PEPTIDE    EKG EKG Interpretation  Date/Time:  Sunday January 26 2023 15:07:19 EDT Ventricular Rate:   84 PR Interval:  147 QRS Duration: 84 QT Interval:  376 QTC Calculation: 445 R Axis:   35 Text Interpretation: Sinus rhythm Anteroseptal infarct, old nonspecific ST changes diffusely are similar to June 2023 Confirmed by Pricilla Loveless [phone]) on 01/26/2023 3:51:37 PM  Radiology DG Chest 2 View  Result Date: 01/26/2023 CLINICAL DATA:  BILATERAL feet and leg pain for 2 days, leg swelling, cough, history hypertension, type II diabetes mellitus EXAM: CHEST - 2 VIEW COMPARISON:  03/20/2022 FINDINGS: Normal  heart size, mediastinal contours, and pulmonary vascularity. Atherosclerotic calcification aorta. Lungs clear. No pulmonary infiltrate, pleural effusion, or pneumothorax. Osseous structures unremarkable. IMPRESSION: No acute abnormalities. Aortic Atherosclerosis (ICD10-I70.0). Electronically Signed   By: Ulyses Southward M.D.   On: 01/26/2023 14:41    Procedures Procedures    Medications Ordered in ED Medications  gabapentin (NEURONTIN) capsule 100 mg (has no administration in time range)  acetaminophen (TYLENOL) tablet 1,000 mg (1,000 mg Oral Given 01/26/23 1431)    ED Course/ Medical Decision Making/ A&P                             Medical Decision Making Amount and/or Complexity of Data Reviewed Labs: ordered.    Details: Mild anemia (11.5) compared to baseline of around 13, though last checked last year.  Normal BNP Radiology: ordered and independent interpretation performed.    Details: No CHF. ECG/medicine tests: ordered and independent interpretation performed.    Details: No significant change from baseline  Risk OTC drugs. Prescription drug management.   Patient is neurovascular intact on exam.  I suspect her pain is partially from the edema and partially from neuropathy.  Will start on some gabapentin.  Will recommend TED hose.  She does have edema and is already on Lasix and does have a history of diastolic heart failure, at the same time I think this is more of a  peripheral problem and I do not think she needs IV Lasix or admission.  At this point, she is ambulatory without difficulty and has had no trauma so do not think x-rays are warranted.  Think she is stable for an outpatient workup/treatment.  Will give return precautions.        Final Clinical Impression(s) / ED Diagnoses Final diagnoses:  Bilateral lower extremity edema  Peripheral neuropathic pain    Rx / DC Orders ED Discharge Orders          Ordered    gabapentin (NEURONTIN) 100 MG capsule  3 times daily        01/26/23 1610              Pricilla Loveless, MD 01/26/23 1636

## 2023-01-26 NOTE — ED Notes (Signed)
Patient transported to X-ray 

## 2023-07-31 ENCOUNTER — Other Ambulatory Visit: Payer: Self-pay | Admitting: *Deleted

## 2023-07-31 ENCOUNTER — Observation Stay (HOSPITAL_COMMUNITY)
Admission: EM | Admit: 2023-07-31 | Discharge: 2023-08-02 | Disposition: A | Discharge status: Home / Self Care | Payer: Medicare HMO | Attending: Internal Medicine | Admitting: Internal Medicine

## 2023-07-31 DIAGNOSIS — E1165 Type 2 diabetes mellitus with hyperglycemia: Secondary | ICD-10-CM | POA: Diagnosis present

## 2023-07-31 DIAGNOSIS — Z7984 Long term (current) use of oral hypoglycemic drugs: Secondary | ICD-10-CM | POA: Diagnosis not present

## 2023-07-31 DIAGNOSIS — F17210 Nicotine dependence, cigarettes, uncomplicated: Secondary | ICD-10-CM | POA: Diagnosis not present

## 2023-07-31 DIAGNOSIS — E669 Obesity, unspecified: Secondary | ICD-10-CM | POA: Diagnosis present

## 2023-07-31 DIAGNOSIS — I110 Hypertensive heart disease with heart failure: Secondary | ICD-10-CM | POA: Diagnosis not present

## 2023-07-31 DIAGNOSIS — I119 Hypertensive heart disease without heart failure: Secondary | ICD-10-CM | POA: Diagnosis present

## 2023-07-31 DIAGNOSIS — Z1152 Encounter for screening for COVID-19: Secondary | ICD-10-CM | POA: Diagnosis not present

## 2023-07-31 DIAGNOSIS — R0602 Shortness of breath: Principal | ICD-10-CM

## 2023-07-31 DIAGNOSIS — I2510 Atherosclerotic heart disease of native coronary artery without angina pectoris: Secondary | ICD-10-CM | POA: Diagnosis present

## 2023-07-31 DIAGNOSIS — R7989 Other specified abnormal findings of blood chemistry: Secondary | ICD-10-CM

## 2023-07-31 DIAGNOSIS — I739 Peripheral vascular disease, unspecified: Secondary | ICD-10-CM

## 2023-07-31 DIAGNOSIS — R079 Chest pain, unspecified: Principal | ICD-10-CM | POA: Insufficient documentation

## 2023-07-31 DIAGNOSIS — I5032 Chronic diastolic (congestive) heart failure: Secondary | ICD-10-CM | POA: Insufficient documentation

## 2023-07-31 DIAGNOSIS — Z79899 Other long term (current) drug therapy: Secondary | ICD-10-CM | POA: Insufficient documentation

## 2023-07-31 DIAGNOSIS — Z720 Tobacco use: Secondary | ICD-10-CM | POA: Diagnosis present

## 2023-07-31 DIAGNOSIS — I700 Atherosclerosis of aorta: Secondary | ICD-10-CM | POA: Diagnosis present

## 2023-08-01 ENCOUNTER — Other Ambulatory Visit: Payer: Self-pay

## 2023-08-01 ENCOUNTER — Observation Stay (HOSPITAL_BASED_OUTPATIENT_CLINIC_OR_DEPARTMENT_OTHER): Payer: Medicare HMO

## 2023-08-01 ENCOUNTER — Encounter (HOSPITAL_COMMUNITY): Payer: Self-pay

## 2023-08-01 ENCOUNTER — Emergency Department (HOSPITAL_COMMUNITY): Payer: Medicare HMO

## 2023-08-01 DIAGNOSIS — Z720 Tobacco use: Secondary | ICD-10-CM | POA: Diagnosis present

## 2023-08-01 DIAGNOSIS — R079 Chest pain, unspecified: Secondary | ICD-10-CM

## 2023-08-01 DIAGNOSIS — I2510 Atherosclerotic heart disease of native coronary artery without angina pectoris: Secondary | ICD-10-CM | POA: Diagnosis present

## 2023-08-01 LAB — CBC WITH DIFFERENTIAL/PLATELET
Abs Immature Granulocytes: 0.02 K/uL (ref 0.00–0.07)
Basophils Absolute: 0.1 K/uL (ref 0.0–0.1)
Basophils Relative: 1 %
Eosinophils Absolute: 0.1 K/uL (ref 0.0–0.5)
Eosinophils Relative: 1 %
HCT: 37.5 % (ref 36.0–46.0)
Hemoglobin: 11.6 g/dL — ABNORMAL LOW (ref 12.0–15.0)
Immature Granulocytes: 0 %
Lymphocytes Relative: 26 %
Lymphs Abs: 2.1 K/uL (ref 0.7–4.0)
MCH: 27 pg (ref 26.0–34.0)
MCHC: 30.9 g/dL (ref 30.0–36.0)
MCV: 87.2 fL (ref 80.0–100.0)
Monocytes Absolute: 0.6 K/uL (ref 0.1–1.0)
Monocytes Relative: 7 %
Neutro Abs: 5.2 K/uL (ref 1.7–7.7)
Neutrophils Relative %: 65 %
Platelets: 207 K/uL (ref 150–400)
RBC: 4.3 MIL/uL (ref 3.87–5.11)
RDW: 15.9 % — ABNORMAL HIGH (ref 11.5–15.5)
WBC: 8.1 K/uL (ref 4.0–10.5)
nRBC: 0 % (ref 0.0–0.2)

## 2023-08-01 LAB — BASIC METABOLIC PANEL
Anion gap: 13 (ref 5–15)
BUN: 24 mg/dL — ABNORMAL HIGH (ref 8–23)
CO2: 24 mmol/L (ref 22–32)
Calcium: 8.8 mg/dL — ABNORMAL LOW (ref 8.9–10.3)
Chloride: 101 mmol/L (ref 98–111)
Creatinine, Ser: 0.99 mg/dL (ref 0.44–1.00)
GFR, Estimated: >60 mL/min (ref >60)
Glucose, Bld: 393 mg/dL — ABNORMAL HIGH (ref 70–99)
Potassium: 3.8 mmol/L (ref 3.5–5.1)
Sodium: 138 mmol/L (ref 135–145)

## 2023-08-01 LAB — ECHOCARDIOGRAM COMPLETE
Area-P 1/2: 3.27 cm2
Est EF: >75
S' Lateral: 2.2 cm

## 2023-08-01 LAB — TROPONIN I (HIGH SENSITIVITY)
Troponin I (High Sensitivity): 32 ng/L — ABNORMAL HIGH
Troponin I (High Sensitivity): 36 ng/L — ABNORMAL HIGH (ref <18)
Troponin I (High Sensitivity): 33 ng/L — ABNORMAL HIGH (ref <18)

## 2023-08-01 LAB — RESP PANEL BY RT-PCR (RSV, FLU A&B, COVID)  RVPGX2
Influenza A by PCR: NEGATIVE
Influenza B by PCR: NEGATIVE
Resp Syncytial Virus by PCR: NEGATIVE
SARS Coronavirus 2 by RT PCR: NEGATIVE

## 2023-08-01 LAB — HEPATIC FUNCTION PANEL
ALT: 25 U/L (ref 0–44)
AST: 30 U/L (ref 15–41)
Albumin: 3.1 g/dL — ABNORMAL LOW (ref 3.5–5.0)
Alkaline Phosphatase: 72 U/L (ref 38–126)
Bilirubin, Direct: <0.1 mg/dL (ref 0.0–0.2)
Total Bilirubin: 0.5 mg/dL (ref 0.3–1.2)
Total Protein: 6.5 g/dL (ref 6.5–8.1)

## 2023-08-01 LAB — BRAIN NATRIURETIC PEPTIDE: B Natriuretic Peptide: 21.7 pg/mL (ref 0.0–100.0)

## 2023-08-01 LAB — GLUCOSE, CAPILLARY
Glucose-Capillary: 241 mg/dL — ABNORMAL HIGH (ref 70–99)
Glucose-Capillary: 206 mg/dL — ABNORMAL HIGH (ref 70–99)

## 2023-08-01 LAB — CBG MONITORING, ED
Glucose-Capillary: 358 mg/dL — ABNORMAL HIGH (ref 70–99)
Glucose-Capillary: 370 mg/dL — ABNORMAL HIGH (ref 70–99)

## 2023-08-01 MED ORDER — IRBESARTAN 150 MG PO TABS
150.0000 mg | ORAL_TABLET | Freq: Every day | ORAL | Status: DC
  Administered 2023-08-02: 150 mg via ORAL
  Filled 2023-08-01: qty 1

## 2023-08-01 MED ORDER — BUSPIRONE HCL 10 MG PO TABS
10.0000 mg | ORAL_TABLET | Freq: Two times a day (BID) | ORAL | Status: DC | PRN
  Administered 2023-08-01: 10 mg via ORAL
  Filled 2023-08-01: qty 1

## 2023-08-01 MED ORDER — ACETAMINOPHEN 325 MG PO TABS
650.0000 mg | ORAL_TABLET | Freq: Four times a day (QID) | ORAL | Status: DC | PRN
  Administered 2023-08-02: 650 mg via ORAL
  Filled 2023-08-01: qty 2

## 2023-08-01 MED ORDER — AMLODIPINE BESYLATE 10 MG PO TABS
10.0000 mg | ORAL_TABLET | Freq: Every day | ORAL | Status: DC
  Administered 2023-08-02: 10 mg via ORAL
  Filled 2023-08-01: qty 1

## 2023-08-01 MED ORDER — AMLODIPINE BESYLATE-VALSARTAN 10-160 MG PO TABS: 1.0000 | ORAL_TABLET | Freq: Every day | ORAL | Status: DC

## 2023-08-01 MED ORDER — GABAPENTIN 300 MG PO CAPS
300.0000 mg | ORAL_CAPSULE | Freq: Three times a day (TID) | ORAL | Status: DC
  Administered 2023-08-01 – 2023-08-02 (×2): 300 mg via ORAL
  Filled 2023-08-01 (×2): qty 1

## 2023-08-01 MED ORDER — INFLUENZA VIRUS VACC SPLIT PF (FLUZONE) 0.5 ML IM SUSY
0.5000 mL | PREFILLED_SYRINGE | INTRAMUSCULAR | Status: DC
  Filled 2023-08-01: qty 0.5

## 2023-08-01 MED ORDER — MONTELUKAST SODIUM 10 MG PO TABS
10.0000 mg | ORAL_TABLET | Freq: Every day | ORAL | Status: DC
  Administered 2023-08-01: 10 mg via ORAL
  Filled 2023-08-01: qty 1

## 2023-08-01 MED ORDER — MOMETASONE FURO-FORMOTEROL FUM 200-5 MCG/ACT IN AERO
2.0000 | INHALATION_SPRAY | Freq: Two times a day (BID) | RESPIRATORY_TRACT | Status: DC
  Administered 2023-08-02: 2 via RESPIRATORY_TRACT
  Filled 2023-08-01: qty 8.8

## 2023-08-01 MED ORDER — ENOXAPARIN SODIUM 40 MG/0.4ML IJ SOSY
40.0000 mg | PREFILLED_SYRINGE | INTRAMUSCULAR | Status: DC
  Administered 2023-08-01: 40 mg via SUBCUTANEOUS
  Filled 2023-08-01: qty 0.4

## 2023-08-01 MED ORDER — HYDROCHLOROTHIAZIDE 12.5 MG PO TABS: 25.0000 mg | ORAL_TABLET | Freq: Every day | ORAL | Status: DC

## 2023-08-01 MED ORDER — GLIMEPIRIDE 2 MG PO TABS: 2.0000 mg | ORAL_TABLET | Freq: Every day | ORAL | Status: DC

## 2023-08-01 MED ORDER — BUSPIRONE HCL 10 MG PO TABS
10.0000 mg | ORAL_TABLET | Freq: Once | ORAL | Status: AC
  Administered 2023-08-01: 10 mg via ORAL
  Filled 2023-08-01: qty 1

## 2023-08-01 MED ORDER — ACETAMINOPHEN 650 MG RE SUPP: 650.0000 mg | Freq: Four times a day (QID) | RECTAL | Status: DC | PRN

## 2023-08-01 MED ORDER — METOPROLOL SUCCINATE ER 50 MG PO TB24
50.0000 mg | ORAL_TABLET | Freq: Every day | ORAL | Status: DC
  Administered 2023-08-01 – 2023-08-02 (×2): 50 mg via ORAL
  Filled 2023-08-01 (×2): qty 1

## 2023-08-01 MED ORDER — ASPIRIN 81 MG PO CHEW
324.0000 mg | CHEWABLE_TABLET | Freq: Once | ORAL | Status: AC
  Administered 2023-08-01: 324 mg via ORAL
  Filled 2023-08-01: qty 4

## 2023-08-01 MED ORDER — ENOXAPARIN SODIUM 40 MG/0.4ML IJ SOSY: 40.0000 mg | PREFILLED_SYRINGE | INTRAMUSCULAR | Status: DC

## 2023-08-01 MED ORDER — METFORMIN HCL 500 MG PO TABS
500.0000 mg | ORAL_TABLET | Freq: Two times a day (BID) | ORAL | Status: DC
  Administered 2023-08-01 – 2023-08-02 (×2): 500 mg via ORAL
  Filled 2023-08-01 (×2): qty 1

## 2023-08-01 MED ORDER — ATORVASTATIN CALCIUM 10 MG PO TABS
20.0000 mg | ORAL_TABLET | Freq: Every day | ORAL | Status: DC
  Administered 2023-08-01 – 2023-08-02 (×2): 20 mg via ORAL
  Filled 2023-08-01 (×2): qty 2

## 2023-08-01 MED ORDER — ASPIRIN 325 MG PO TABS: 325.0000 mg | ORAL_TABLET | Freq: Once | ORAL | Status: DC

## 2023-08-01 MED ORDER — INSULIN ASPART 100 UNIT/ML IJ SOLN
0.0000 [IU] | Freq: Three times a day (TID) | INTRAMUSCULAR | Status: DC
  Administered 2023-08-01: 15 [IU] via SUBCUTANEOUS
  Administered 2023-08-01: 5 [IU] via SUBCUTANEOUS
  Administered 2023-08-02 (×2): 8 [IU] via SUBCUTANEOUS
  Filled 2023-08-01: qty 0.15

## 2023-08-01 NOTE — Progress Notes (Signed)
Echo attempted. Pt care ongoing. Will attempt again later.

## 2023-08-01 NOTE — H&P (Addendum)
History and Physical    Patient: Debbie Davis:[id number] DOB: Sep 13, 1961 DOA: 07/31/2023 DOS: the patient was seen and examined on 08/01/2023 PCP: Scifres, Dorothy, PA-C (Inactive)  Patient coming from: Home  Chief Complaint:  Chief Complaint  Patient presents with   Shortness of Breath   HPI: Debbie Davis is a 62 y.o. female with medical history significant of aortic atherosclerosis, hyperlipidemia, chronic diastolic heart failure, type 2 diabetes, hypertension, hypertensive heart disease, class I obesity, tobacco use who presented to the emergency department with complaints of dyspnea associated with mild pressure-like chest pain.   No palpitations, diaphoresis, PND, orthopnea, but occasionally has pitting edema of the lower extremities.  She denied fever, chills, rhinorrhea, sore throat, wheezing or hemoptysis. No abdominal pain, nausea, emesis, diarrhea, constipation, melena or hematochezia.  No flank pain, dysuria, frequency or hematuria.  No polyuria, polydipsia, polyphagia or blurred vision.   Lab work: CBC showed white count 8.1, hemoglobin 11.6 g/dL platelets 811.  Troponin was 32 and 36 then 33 ng/L and BNP 21.7 pg/mL.  Negative coronavirus, influenza and RSV PCR.  BMP showed a glucose of 393, BUN 24, creatinine 0.99 and calcium 8.8 mg/dL.  The rest of the electrolytes were normal.  Imaging: 2 view chest radiograph showing aortic atherosclerosis, but no active cardiopulmonary disease.   ED course: Initial vital signs were temperature 97.9 F, pulse 99, respiration 16, BP 152/88 mmHg O2 sat 100% on room air.  The patient received 324 mg of chewable aspirin.  Review of Systems: As mentioned in the history of present illness. All other systems reviewed and are negative.  Past Medical History:  Diagnosis Date   Atherosclerosis of aorta (HCC) 11/25/2016   Chronic diastolic heart failure (HCC) 11/25/2016   Diabetes type 2, uncontrolled 08/22/2016   Hypertension    Hypertensive  heart disease 08/20/2016   Obesity (BMI 30-39.9) 11/25/2016   Past Surgical History:  Procedure Laterality Date   KIDNEY SURGERY Right    removal of kidney stone   RIGHT/LEFT HEART CATH AND CORONARY ANGIOGRAPHY N/A 11/26/2016   Procedure: Right/Left Heart Cath and Coronary Angiography;  Surgeon: Marykay Lex, MD;  Location: [facility] INVASIVE CV LAB;  Service: Cardiovascular;  Laterality: N/A;   TUBAL LIGATION     Social History:  reports that she has been smoking cigarettes. She has a 15 pack-year smoking history. She has never used smokeless tobacco. She reports current alcohol use. She reports that she does not use drugs.  Allergies  Allergen Reactions   Codeine Rash   Morphine And Codeine Rash    Family History  Problem Relation Age of Onset   Cancer Mother    Heart disease Father    Diabetes Sister    Diabetes Brother    Diabetes Sister    Diabetes Sister    Diabetes Sister     Prior to Admission medications   Medication Sig Start Date End Date Taking? Authorizing Provider  amLODipine-valsartan (EXFORGE) 10-160 MG tablet Take 1 tablet by mouth daily. 05/16/23  Yes [provider]  azithromycin (ZITHROMAX) 250 MG tablet Take 250 mg by mouth. 05/16/23  Yes [provider]  fluticasone (FLONASE) 50 MCG/ACT nasal spray Place 2 sprays into both nostrils daily as needed for allergies. 05/16/23  Yes [provider]  fluticasone-salmeterol (ADVAIR) 250-50 MCG/ACT AEPB Inhale 1 puff into the lungs 2 (two) times daily. 06/13/23  Yes [provider]  gabapentin (NEURONTIN) 300 MG capsule Take 300 mg by mouth 3 (three) times daily.  07/15/23  Yes [provider]  glimepiride (AMARYL) 2 MG tablet Take 2 mg by mouth daily. 05/17/23  Yes [provider]  metoprolol-hydrochlorothiazide (LOPRESSOR HCT) 100-50 MG tablet Take 1 tablet by mouth daily. 07/24/23  Yes [provider]  montelukast (SINGULAIR) 10 MG tablet Take 10 mg by mouth  daily. 05/29/23  Yes [provider]  albuterol (PROVENTIL HFA;VENTOLIN HFA) 108 (90 Base) MCG/ACT inhaler Inhale 1-2 puffs into the lungs every 4 (four) hours as needed for wheezing or shortness of breath. 08/12/16   Cheri Fowler, PA-C  amLODipine (NORVASC) 10 MG tablet Take 1 tablet (10 mg total) by mouth daily. 07/12/20   Rolan Bucco, MD  amLODipine (NORVASC) 10 MG tablet Take 1 tablet (10 mg total) by mouth daily. 03/20/22 04/19/22  Darrick Grinder, PA-C  atorvastatin (LIPITOR) 20 MG tablet Take 20 mg by mouth daily.    [provider]  benzonatate (TESSALON) 100 MG capsule Take 1-2 caps PO TID PRN 01/22/23   Mayers, Cari S, PA-C  cefadroxil (DURICEF) 500 MG capsule Take 1 capsule (500 mg total) by mouth 2 (two) times daily. 07/19/22   Prosperi, Christian H, PA-C  diclofenac Sodium (VOLTAREN) 1 % GEL Apply 2 g topically 4 (four) times daily. 11/05/22   Jeannie Fend, PA-C  furosemide (LASIX) 20 MG tablet Take 1 tablet (20 mg total) by mouth daily. 01/22/23   Mayers, Cari S, PA-C  hydrochlorothiazide (HYDRODIURIL) 25 MG tablet Take 1 tablet (25 mg total) by mouth daily. 07/12/20   Rolan Bucco, MD  hydrochlorothiazide (HYDRODIURIL) 25 MG tablet Take 1 tablet (25 mg total) by mouth daily. 03/20/22 04/19/22  Darrick Grinder, PA-C  HYDROcodone-acetaminophen (NORCO/VICODIN) 5-325 MG tablet Take 1 tablet by mouth every 6 (six) hours as needed for severe pain. 09/10/21   Zadie Rhine, MD  lidocaine (LIDODERM) 5 % Place 1 patch onto the skin daily. Remove & Discard patch within 12 hours or as directed by MD 03/25/19   Bill Salinas, PA-C  metFORMIN (GLUCOPHAGE) 500 MG tablet Take 1 tablet (500 mg total) by mouth 2 (two) times daily with a meal. 07/12/20   Rolan Bucco, MD  metoprolol succinate (TOPROL-XL) 100 MG 24 hr tablet Take 1 tablet (100 mg total) by mouth daily. Take with or immediately following a meal 07/12/20   Rolan Bucco, MD  Multiple Vitamin (MULTIVITAMIN WITH  MINERALS) TABS tablet Take 1 tablet by mouth daily.     [provider]  naproxen sodium (ANAPROX) 220 MG tablet Take 220 mg by mouth 2 (two) times daily as needed (for pain).     [provider]  ondansetron (ZOFRAN) 4 MG tablet Take 1 tablet (4 mg total) by mouth every 6 (six) hours. 07/19/22   Prosperi, Christian H, PA-C  sodium chloride (OCEAN) 0.65 % SOLN nasal spray Place 1 spray into both nostrils as needed for congestion. 09/25/21   Mesner, Barbara Cower, MD  triamcinolone (NASACORT) 55 MCG/ACT AERO nasal inhaler Place 2 sprays into the nose daily. 04/12/22   Waldon Merl, PA-C  umeclidinium-vilanterol (ANORO ELLIPTA) 62.5-25 MCG/INH AEPB Inhale 1 puff into the lungs daily as needed (shortness of breath).     [provider]  valsartan (DIOVAN) 160 MG tablet Take 160 mg by mouth daily.    [provider]    Physical Exam: Vitals:   08/01/23 0012 08/01/23 0334  BP: (!) 152/88 (!) 147/91  Pulse: (!) 109 (!) 105  Resp: 16 18  Temp: 97.9 F (  36.6 C) 97.7 F (36.5 C)  TempSrc: Oral Oral  SpO2: 100% 99%   Physical Exam Vitals and nursing note reviewed.  Constitutional:      Appearance: She is well-developed. She is obese.  HENT:     Head: Normocephalic.     Nose: No rhinorrhea.     Mouth/Throat:     Mouth: Mucous membranes are moist.     Dentition: Abnormal dentition. Dental caries present.  Eyes:     General: No scleral icterus.    Pupils: Pupils are equal, round, and reactive to light.  Neck:     Vascular: No JVD.  Cardiovascular:     Rate and Rhythm: Normal rate and regular rhythm.  Pulmonary:     Effort: No tachypnea or accessory muscle usage.     Breath sounds: No wheezing, rhonchi or rales.  Abdominal:     General: Bowel sounds are normal.     Palpations: Abdomen is soft.     Tenderness: There is no abdominal tenderness.  Musculoskeletal:     Cervical back: Neck supple.     Right lower leg: No edema.     Left lower leg: No edema.   Skin:    General: Skin is warm and dry.  Neurological:     General: No focal deficit present.     Mental Status: She is alert and oriented to person, place, and time.  Psychiatric:        Mood and Affect: Mood is anxious.        Behavior: Behavior normal.     Data Reviewed:  Results are pending, will review when available. 08/21/2016 transthoracic echocardiogram Study Conclusions  - Left ventricle: The cavity size was normal. Wall thickness was   normal. Systolic function was normal. The estimated ejection   fraction was in the range of 60% to 65%. Wall motion was normal;   there were no regional wall motion abnormalities. Doppler   parameters are consistent with abnormal left ventricular   relaxation (grade 1 diastolic dysfunction). - Pericardium, extracardiac: A trivial pericardial effusion was   identified.  11/26/2016 right/left heart cath with coronary angiography Conclusion   Angiographically normal coronary arteries, albeit extensive or tortuosity noted. Hemodynamic findings consistent with mild pulmonary hypertension - secondary to diastolic dysfunction LV end diastolic pressure is moderately elevated. The left ventricular systolic function is normal. The left ventricular ejection fraction is greater than 65% by visual estimate.   Hypertensive heart disease with angiographic normal coronary arteries. Poorly controlled hypertension with pressures in the 170s on initial presentation.   Plan: Return to short stay TR band removal. Plan to give 1 more dose of IV labetalol upon arrival short stay and give by mouth dose of amlodipine along with her home dose PO Lasix. She'll be discharged after bedrest to follow-up with Dr. Donnie Aho  EKG: Vent. rate 107 BPM PR interval 138 ms QRS duration 87 ms QT/QTcB 347/463 ms P-R-T axes 66 20 18 Sinus tachycardia Anteroseptal infarct, old Minimal ST depression, lateral leads No significant changes from previous.  Assessment  and Plan: Principal Problem:   Chest pain History of nonobstructive   Coronary atherosclerosis No troponin uptrend. No significant EKG changes. Observation/telemetry. Supplemental oxygen as needed. Continue daily aspirin and statin. Continue metoprolol succinate 50 mg p.o. daily. Serial EKG. Obtain echocardiogram. Smoking cessation advised multiple times. Needs to follow-up with cardiology. -Did not follow after 2018 cardiac cath  Active Problems:   Hypertensive heart disease Continue amlodipine 10 mg p.o. daily. Continue  valsartan 160 mg p.o. daily. Continue hydrochlorothiazide but decrease to 25 mg p.o. daily. Continue metoprolol succinate 50 mg p.o. daily.    Type 2 diabetes mellitus with hyperglycemia (HCC) Carbohydrate modified diet. Continue annual metformin 500 mg p.o. twice daily. Continue glimepiride 2 mg p.o. daily CBG monitoring with RI SS. Check hemoglobin A1c.    Hyperlipidemia   Atherosclerosis of aorta (HCC) Continue atorvastatin 20 mg p.o. daily Smoking cessation.    Chronic diastolic heart failure (HCC) No signs of volume overload. Continue ARB, beta-blocker and diuretic    Obesity (BMI 30-39.9) Current weight pending. Will calculate BMI once available.    Tobacco use Tobacco cessation advised. Nicotine replacement therapy ordered.    Situational anxiety The patient was anxious about being in the hospital. Buspirone 10 mg p.o. x 1 given and then ordered as needed.    Advance Care Planning:   Code Status: Full Code   Consults:   Family Communication:   Severity of Illness: The appropriate patient status for this patient is OBSERVATION. Observation status is judged to be reasonable and necessary in order to provide the required intensity of service to ensure the patient's safety. The patient's presenting symptoms, physical exam findings, and initial radiographic and laboratory data in the context of their medical condition is felt to place  them at decreased risk for further clinical deterioration. Furthermore, it is anticipated that the patient will be medically stable for discharge from the hospital within 2 midnights of admission.   Author: Bobette Mo, MD 08/01/2023 7:14 AM  For on call review [email or web address].   This document was prepared using Dragon voice recognition software and may contain some unintended transcription errors.

## 2023-08-01 NOTE — ED Provider Notes (Signed)
EMERGENCY DEPARTMENT AT [facility] Provider Note   CSN: [id number] Arrival date & time: 07/31/23  2346     History  Chief Complaint  Patient presents with   Shortness of Breath    Debbie Davis is a 62 y.o. female.  The history is provided by the patient and medical records.  Shortness of Breath  62 y.o. female with history of congestive heart failure, diabetes, hypertension, obesity, coronary atherosclerosis, presenting to the ED with shortness of breath.  She was walking around at the [employer] today when she began feeling short of breath.  She reports "a little bit" of chest pain but not severe.  She did not have any diaphoresis, nausea, vomiting, or near syncopal symptoms.  States now she feels like she is losing her voice and she is coughing.  She denies any fever or chills.  No sick contacts known.  Patient admits she did not take any of her medications yesterday because she "forgot".  Home Medications Prior to Admission medications   Medication Sig Start Date End Date Taking? Authorizing Provider  albuterol (PROVENTIL HFA;VENTOLIN HFA) 108 (90 Base) MCG/ACT inhaler Inhale 1-2 puffs into the lungs every 4 (four) hours as needed for wheezing or shortness of breath. 08/12/16   Cheri Fowler, PA-C  amLODipine (NORVASC) 10 MG tablet Take 1 tablet (10 mg total) by mouth daily. 07/12/20   Rolan Bucco, MD  amLODipine (NORVASC) 10 MG tablet Take 1 tablet (10 mg total) by mouth daily. 03/20/22 04/19/22  Darrick Grinder, PA-C  benzonatate (TESSALON) 100 MG capsule Take 1-2 caps PO TID PRN 01/22/23   Mayers, Cari S, PA-C  cefadroxil (DURICEF) 500 MG capsule Take 1 capsule (500 mg total) by mouth 2 (two) times daily. 07/19/22   Prosperi, Christian H, PA-C  diclofenac Sodium (VOLTAREN) 1 % GEL Apply 2 g topically 4 (four) times daily. 11/05/22   Jeannie Fend, PA-C  furosemide (LASIX) 20 MG tablet Take 1 tablet (20 mg total) by mouth daily. 01/22/23    Mayers, Cari S, PA-C  gabapentin (NEURONTIN) 100 MG capsule Take 1 capsule (100 mg total) by mouth 3 (three) times daily. 01/26/23   Pricilla Loveless, MD  hydrochlorothiazide (HYDRODIURIL) 25 MG tablet Take 1 tablet (25 mg total) by mouth daily. 07/12/20   Rolan Bucco, MD  hydrochlorothiazide (HYDRODIURIL) 25 MG tablet Take 1 tablet (25 mg total) by mouth daily. 03/20/22 04/19/22  Darrick Grinder, PA-C  HYDROcodone-acetaminophen (NORCO/VICODIN) 5-325 MG tablet Take 1 tablet by mouth every 6 (six) hours as needed for severe pain. 09/10/21   Zadie Rhine, MD  lidocaine (LIDODERM) 5 % Place 1 patch onto the skin daily. Remove & Discard patch within 12 hours or as directed by MD 03/25/19   Bill Salinas, PA-C  metFORMIN (GLUCOPHAGE) 500 MG tablet Take 1 tablet (500 mg total) by mouth 2 (two) times daily with a meal. 07/12/20   Rolan Bucco, MD  metoprolol succinate (TOPROL-XL) 100 MG 24 hr tablet Take 1 tablet (100 mg total) by mouth daily. Take with or immediately following a meal 07/12/20   Rolan Bucco, MD  Multiple Vitamin (MULTIVITAMIN WITH MINERALS) TABS tablet Take 1 tablet by mouth daily.     [provider]  naproxen sodium (ANAPROX) 220 MG tablet Take 220 mg by mouth 2 (two) times daily as needed (for pain).     [provider]  ondansetron (ZOFRAN) 4 MG tablet Take 1 tablet (4 mg total) by mouth  every 6 (six) hours. 07/19/22   Prosperi, Christian H, PA-C  sodium chloride (OCEAN) 0.65 % SOLN nasal spray Place 1 spray into both nostrils as needed for congestion. 09/25/21   Mesner, Barbara Cower, MD  triamcinolone (NASACORT) 55 MCG/ACT AERO nasal inhaler Place 2 sprays into the nose daily. 04/12/22   Waldon Merl, PA-C  umeclidinium-vilanterol (ANORO ELLIPTA) 62.5-25 MCG/INH AEPB Inhale 1 puff into the lungs daily as needed (shortness of breath).     [provider]  valsartan (DIOVAN) 160 MG tablet Take 160 mg by mouth daily.    [provider]       Allergies    Codeine and Morphine and codeine    Review of Systems   Review of Systems  Respiratory:  Positive for shortness of breath.   All other systems reviewed and are negative.   Physical Exam Updated Vital Signs BP (!) 152/88 (BP Location: Left Arm)   Pulse (!) 109   Temp 97.9 F (36.6 C) (Oral)   Resp 16   SpO2 100%   Physical Exam Vitals and nursing note reviewed.  Constitutional:      Appearance: She is well-developed.  HENT:     Head: Normocephalic and atraumatic.     Mouth/Throat:     Comments: Airway clear, voice is raspy, no stridor Eyes:     Conjunctiva/sclera: Conjunctivae normal.     Pupils: Pupils are equal, round, and reactive to light.  Cardiovascular:     Rate and Rhythm: Normal rate and regular rhythm.     Heart sounds: Normal heart sounds.  Pulmonary:     Effort: Pulmonary effort is normal.     Breath sounds: Normal breath sounds. No wheezing or rhonchi.     Comments: Normal WOB, no audible wheezes or rhonchi, no acute distress, voice is hoarse but able to speak in full sentences without difficulty Abdominal:     General: Bowel sounds are normal.     Palpations: Abdomen is soft.  Musculoskeletal:        General: Normal range of motion.     Cervical back: Normal range of motion.  Skin:    General: Skin is warm and dry.  Neurological:     Mental Status: She is alert and oriented to person, place, and time.     ED Results / Procedures / Treatments   Labs (all labs ordered are listed, but only abnormal results are displayed) Labs Reviewed  CBC WITH DIFFERENTIAL/PLATELET - Abnormal; Notable for the following components:      Result Value   Hemoglobin 11.6 (*)    RDW 15.9 (*)    All other components within normal limits  BASIC METABOLIC PANEL - Abnormal; Notable for the following components:   Glucose, Bld 393 (*)    BUN 24 (*)    Calcium 8.8 (*)    All other components within normal limits  TROPONIN I (HIGH SENSITIVITY) - Abnormal;  Notable for the following components:   Troponin I (High Sensitivity) 32 (*)    All other components within normal limits  RESP PANEL BY RT-PCR (RSV, FLU A&B, COVID)  RVPGX2  BRAIN NATRIURETIC PEPTIDE    EKG EKG Interpretation Date/Time:  Friday August 01 2023 00:14:09 EDT Ventricular Rate:  107 PR Interval:  138 QRS Duration:  87 QT Interval:  347 QTC Calculation: 463 R Axis:   20  Text Interpretation: Sinus tachycardia Anteroseptal infarct, old Minimal ST depression, lateral leads Confirmed by Tilden Fossa [phone]) on 08/01/2023 2:08:42 AM  Radiology DG Chest 2 View  Result Date: 08/01/2023 CLINICAL DATA:  Shortness of breath EXAM: CHEST - 2 VIEW COMPARISON:  01/26/2023 FINDINGS: Low lung volumes accentuate pulmonary vascularity and cardiomediastinal silhouette. Aortic atherosclerotic calcification. No focal consolidation, pleural effusion, or pneumothorax. No displaced rib fractures. IMPRESSION: No active cardiopulmonary disease. Electronically Signed   By: Minerva Fester M.D.   On: 08/01/2023 02:24    Procedures Procedures    Medications Ordered in ED Medications - No data to display  ED Course/ Medical Decision Making/ A&P                                 Medical Decision Making Amount and/or Complexity of Data Reviewed Labs: ordered. Radiology: ordered and independent interpretation performed. ECG/medicine tests: ordered and independent interpretation performed.  Risk OTC drugs. Decision regarding hospitalization.   62 y.o. F here with SOB, started today while talking around the [place].  Reports "a little" chest pain but subsided, no associated symptoms really.  She is having cough and has now lost her voice. Denies sick contacts.  Afebrile, non-toxic.  She sounds hoarse on exam but no stridor or airway compromise. Lungs grossly clear.  Also admits she "forgot" to take all her home meds today.  BP mildly elevated, low grade tachy but likely from not having her  B-blocker.  CXR obtained from triage without acute findings.  Will check labs, RVP.  Labs as above--no leukocytosis.  She does have hyperglycemia without findings of DKA.  Normal BNP.  Troponin is elevated at 32.  All priors have been negative.  Chest x-ray without acute findings.  RVP also negative.    Patient remains without any active chest pain here.  Her EKG is unchanged from prior.  She did have prior cardiac cath in 2018 with essentially normal coronaries and EF but looks like she has been lost to follow-up since then.  Her symptoms are quite atypical, seems more consistent with a viral process but given her elevated troponin, will admit for further workup.    Discussed with Dr. Margo Aye-- will admit for ongoing care.  Final Clinical Impression(s) / ED Diagnoses Final diagnoses:  SOB (shortness of breath)  Elevated troponin    Rx / DC Orders ED Discharge Orders     None         Garlon Hatchet, PA-C 08/01/23 1610    Tilden Fossa, MD 08/02/23 2247

## 2023-08-01 NOTE — ED Notes (Signed)
ED TO INPATIENT HANDOFF REPORT  Name/Age/Gender Debbie Davis 62 y.o. female  Code Status    Code Status Orders  (From admission, onward)           Start     Ordered   08/01/23 0803  Full code  Continuous       Question:  By:  Answer:  Consent: discussion documented in EHR   08/01/23 0806           Code Status History     Date Active Date Inactive Code Status Order ID Comments User Context   08/01/2023 0806 08/01/2023 0806 Full Code [id number]  Bobette Mo, MD ED   11/26/2016 1210 11/26/2016 1907 Full Code [id number]  Marykay Lex, MD Inpatient   08/20/2016 2211 08/24/2016 1555 Full Code [id number]  Eduard Clos, MD Inpatient       Home/SNF/Other Home  Chief Complaint Chest pain [R07.9]  Level of Care/Admitting Diagnosis ED Disposition     ED Disposition  Admit   Condition  --   Comment  Hospital Area: [facility] [[id number]]  Level of Care: Telemetry [5]  Admit to tele based on following criteria: Monitor for Ischemic changes  May place patient in observation at [facility] or [facility] if equivalent level of care is available:: Yes  Covid Evaluation: Asymptomatic - no recent exposure (last 10 days) testing not required  Diagnosis: Chest pain [010932]  Admitting Physician: Darlin Drop [[id number]]  Attending Physician: Darlin Drop [[id number]]          Medical History Past Medical History:  Diagnosis Date   Atherosclerosis of aorta (HCC) 11/25/2016   Chronic diastolic heart failure (HCC) 11/25/2016   Diabetes type 2, uncontrolled 08/22/2016   Hypertension    Hypertensive heart disease 08/20/2016   Obesity (BMI 30-39.9) 11/25/2016    Allergies Allergies  Allergen Reactions   Codeine Rash   Morphine And Codeine Rash    IV Location/Drains/Wounds Patient Lines/Drains/Airways Status     Active Line/Drains/Airways     Name Placement date Placement time Site Days   Peripheral IV 08/01/23 20 G Left  Antecubital 08/01/23  0808  Antecubital  less than 1            Labs/Imaging Results for orders placed or performed during the hospital encounter of 07/31/23 (from the past 48 hour(s))  CBC with Differential     Status: Abnormal   Collection Time: 08/01/23  3:29 AM  Result Value Ref Range   WBC 8.1 4.0 - 10.5 K/uL   RBC 4.30 3.87 - 5.11 MIL/uL   Hemoglobin 11.6 (L) 12.0 - 15.0 g/dL   HCT 62.3 76.2 - 83.1 %   MCV 87.2 80.0 - 100.0 fL   MCH 27.0 26.0 - 34.0 pg   MCHC 30.9 30.0 - 36.0 g/dL   RDW 51.7 (H) 61.6 - 07.3 %   Platelets 207 150 - 400 K/uL   nRBC 0.0 0.0 - 0.2 %   Neutrophils Relative % 65 %   Neutro Abs 5.2 1.7 - 7.7 K/uL   Lymphocytes Relative 26 %   Lymphs Abs 2.1 0.7 - 4.0 K/uL   Monocytes Relative 7 %   Monocytes Absolute 0.6 0.1 - 1.0 K/uL   Eosinophils Relative 1 %   Eosinophils Absolute 0.1 0.0 - 0.5 K/uL   Basophils Relative 1 %   Basophils Absolute 0.1 0.0 - 0.1 K/uL   Immature Granulocytes 0 %   Abs Immature Granulocytes 0.02  0.00 - 0.07 K/uL    Comment: Performed at [facility], [address] [place], [place] [address]  Basic metabolic panel     Status: Abnormal   Collection Time: 08/01/23  3:29 AM  Result Value Ref Range   Sodium 138 135 - 145 mmol/L   Potassium 3.8 3.5 - 5.1 mmol/L   Chloride 101 98 - 111 mmol/L   CO2 24 22 - 32 mmol/L   Glucose, Bld 393 (H) 70 - 99 mg/dL    Comment: Glucose reference range applies only to samples taken after fasting for at least 8 hours.   BUN 24 (H) 8 - 23 mg/dL   Creatinine, Ser 6.04 0.44 - 1.00 mg/dL   Calcium 8.8 (L) 8.9 - 10.3 mg/dL   GFR, Estimated >54 >09 mL/min    Comment: (NOTE) Calculated using the CKD-EPI Creatinine Equation (2021)    Anion gap 13 5 - 15    Comment: Performed at [facility], [address], [place], [place] [address]  Brain natriuretic peptide     Status: None   Collection Time: 08/01/23  3:29 AM  Result Value Ref Range   B Natriuretic  Peptide 21.7 0.0 - 100.0 pg/mL    Comment: Performed at [facility], [address], [place], [place] [address]  Troponin I (High Sensitivity)     Status: Abnormal   Collection Time: 08/01/23  3:29 AM  Result Value Ref Range   Troponin I (High Sensitivity) 32 (H) <18 ng/L    Comment: (NOTE) Elevated high sensitivity troponin I (hsTnI) values and significant  changes across serial measurements may suggest ACS but many other  chronic and acute conditions are known to elevate hsTnI results.  Refer to the "Links" section for chest pain algorithms and additional  guidance. Performed at [facility], [address] [place], [place] [address]   Resp panel by RT-PCR (RSV, Flu A&B, Covid) Anterior Nasal Swab     Status: None   Collection Time: 08/01/23  3:29 AM   Specimen: Anterior Nasal Swab  Result Value Ref Range   SARS Coronavirus 2 by RT PCR NEGATIVE NEGATIVE    Comment: (NOTE) SARS-CoV-2 target nucleic acids are NOT DETECTED.  The SARS-CoV-2 RNA is generally detectable in upper respiratory specimens during the acute phase of infection. The lowest concentration of SARS-CoV-2 viral copies this assay can detect is 138 copies/mL. A negative result does not preclude SARS-Cov-2 infection and should not be used as the sole basis for treatment or other patient management decisions. A negative result may occur with  improper specimen collection/handling, submission of specimen other than nasopharyngeal swab, presence of viral mutation(s) within the areas targeted by this assay, and inadequate number of viral copies(<138 copies/mL). A negative result must be combined with clinical observations, patient history, and epidemiological information. The expected result is Negative.  Fact Sheet for Patients:  BloggerCourse.com  Fact Sheet for Healthcare Providers:  SeriousBroker.it  This test is no t yet  approved or cleared by the [place] FDA and  has been authorized for detection and/or diagnosis of SARS-CoV-2 by FDA under an Emergency Use Authorization (EUA). This EUA will remain  in effect (meaning this test can be used) for the duration of the COVID-19 declaration under Section 564(b)(1) of the Act, 21 U.S.C.section 360bbb-3(b)(1), unless the authorization is terminated  or revoked sooner.       Influenza A by PCR NEGATIVE NEGATIVE   Influenza B by PCR  NEGATIVE NEGATIVE    Comment: (NOTE) The Xpert Xpress SARS-CoV-2/FLU/RSV plus assay is intended as an aid in the diagnosis of influenza from Nasopharyngeal swab specimens and should not be used as a sole basis for treatment. Nasal washings and aspirates are unacceptable for Xpert Xpress SARS-CoV-2/FLU/RSV testing.  Fact Sheet for Patients: BloggerCourse.com  Fact Sheet for Healthcare Providers: SeriousBroker.it  This test is not yet approved or cleared by the [place] FDA and has been authorized for detection and/or diagnosis of SARS-CoV-2 by FDA under an Emergency Use Authorization (EUA). This EUA will remain in effect (meaning this test can be used) for the duration of the COVID-19 declaration under Section 564(b)(1) of the Act, 21 U.S.C. section 360bbb-3(b)(1), unless the authorization is terminated or revoked.     Resp Syncytial Virus by PCR NEGATIVE NEGATIVE    Comment: (NOTE) Fact Sheet for Patients: BloggerCourse.com  Fact Sheet for Healthcare Providers: SeriousBroker.it  This test is not yet approved or cleared by the [place] FDA and has been authorized for detection and/or diagnosis of SARS-CoV-2 by FDA under an Emergency Use Authorization (EUA). This EUA will remain in effect (meaning this test can be used) for the duration of the COVID-19 declaration under Section 564(b)(1) of the Act, 21  U.S.C. section 360bbb-3(b)(1), unless the authorization is terminated or revoked.  Performed at [facility], [address] [place], [place] [address]   Troponin I (High Sensitivity)     Status: Abnormal   Collection Time: 08/01/23  6:22 AM  Result Value Ref Range   Troponin I (High Sensitivity) 36 (H) <18 ng/L    Comment: (NOTE) Elevated high sensitivity troponin I (hsTnI) values and significant  changes across serial measurements may suggest ACS but many other  chronic and acute conditions are known to elevate hsTnI results.  Refer to the "Links" section for chest pain algorithms and additional  guidance. Performed at [facility], [address], [place], [place] [address]   CBG monitoring, ED     Status: Abnormal   Collection Time: 08/01/23  8:06 AM  Result Value Ref Range   Glucose-Capillary 358 (H) 70 - 99 mg/dL    Comment: Glucose reference range applies only to samples taken after fasting for at least 8 hours.  Troponin I (High Sensitivity)     Status: Abnormal   Collection Time: 08/01/23  8:07 AM  Result Value Ref Range   Troponin I (High Sensitivity) 33 (H) <18 ng/L    Comment: (NOTE) Elevated high sensitivity troponin I (hsTnI) values and significant  changes across serial measurements may suggest ACS but many other  chronic and acute conditions are known to elevate hsTnI results.  Refer to the "Links" section for chest pain algorithms and additional  guidance. Performed at [facility], [address] [place], [place] [address]   Hepatic function panel     Status: Abnormal   Collection Time: 08/01/23  8:07 AM  Result Value Ref Range   Total Protein 6.5 6.5 - 8.1 g/dL   Albumin 3.1 (L) 3.5 - 5.0 g/dL   AST 30 15 - 41 U/L   ALT 25 0 - 44 U/L   Alkaline Phosphatase 72 38 - 126 U/L   Total Bilirubin 0.5 0.3 - 1.2 mg/dL   Bilirubin, Direct <9.5 0.0 - 0.2 mg/dL   Indirect Bilirubin NOT CALCULATED 0.3 -  0.9 mg/dL    Comment: Performed at [facility], [address] [place], [place]  [address]  CBG monitoring, ED     Status: Abnormal   Collection Time: 08/01/23 12:37 PM  Result Value Ref Range   Glucose-Capillary 370 (H) 70 - 99 mg/dL    Comment: Glucose reference range applies only to samples taken after fasting for at least 8 hours.   DG Chest 2 View  Result Date: 08/01/2023 CLINICAL DATA:  Shortness of breath EXAM: CHEST - 2 VIEW COMPARISON:  01/26/2023 FINDINGS: Low lung volumes accentuate pulmonary vascularity and cardiomediastinal silhouette. Aortic atherosclerotic calcification. No focal consolidation, pleural effusion, or pneumothorax. No displaced rib fractures. IMPRESSION: No active cardiopulmonary disease. Electronically Signed   By: Minerva Fester M.D.   On: 08/01/2023 02:24    Pending Labs Unresulted Labs (From admission, onward)     Start     Ordered   08/02/23 0500  HIV Antibody (routine testing w rflx)  (HIV Antibody (Routine testing w reflex) panel)  Tomorrow morning,   R        08/01/23 0806   08/01/23 0800  Hemoglobin A1c  Add-on,   AD       Comments: To assess prior glycemic control    08/01/23 0800            Vitals/Pain Today's Vitals   08/01/23 1145 08/01/23 1200 08/01/23 1215 08/01/23 1240  BP:    133/86  Pulse: (!) 121 (!) 124 (!) 120 92  Resp: 16 (!) 21 17 15   Temp:    97.7 F (36.5 C)  TempSrc:    Oral  SpO2: 100% 99% 100% 100%  PainSc:        Isolation Precautions No active isolations  Medications Medications  insulin aspart (novoLOG) injection 0-15 Units (15 Units Subcutaneous Given 08/01/23 1258)  enoxaparin (LOVENOX) injection 40 mg (has no administration in time range)  acetaminophen (TYLENOL) tablet 650 mg (has no administration in time range)    Or  acetaminophen (TYLENOL) suppository 650 mg (has no administration in time range)  metFORMIN (GLUCOPHAGE) tablet 500 mg (has no administration in time range)   hydrochlorothiazide (HYDRODIURIL) tablet 25 mg (has no administration in time range)  metoprolol succinate (TOPROL-XL) 24 hr tablet 50 mg (has no administration in time range)  montelukast (SINGULAIR) tablet 10 mg (has no administration in time range)  amLODipine-valsartan (EXFORGE) 10-160 MG per tablet 1 tablet (has no administration in time range)  gabapentin (NEURONTIN) capsule 300 mg (has no administration in time range)  mometasone-formoterol (DULERA) 200-5 MCG/ACT inhaler 2 puff (has no administration in time range)  atorvastatin (LIPITOR) tablet 20 mg (has no administration in time range)  glimepiride (AMARYL) tablet 2 mg (has no administration in time range)  busPIRone (BUSPAR) tablet 10 mg (has no administration in time range)  aspirin chewable tablet 324 mg (324 mg Oral Given 08/01/23 0547)  busPIRone (BUSPAR) tablet 10 mg (10 mg Oral Given 08/01/23 1305)    Mobility walks

## 2023-08-01 NOTE — Progress Notes (Signed)
  Echocardiogram 2D Echocardiogram has been performed.  Debbie Davis 08/01/2023, 3:29 PM

## 2023-08-01 NOTE — ED Triage Notes (Signed)
Pt to ED by EMS from [facility] c/o SOB. Pt lung sounds clear bilaterally, VSS, NADN.

## 2023-08-02 DIAGNOSIS — R079 Chest pain, unspecified: Secondary | ICD-10-CM | POA: Diagnosis not present

## 2023-08-02 LAB — GLUCOSE, CAPILLARY
Glucose-Capillary: 298 mg/dL — ABNORMAL HIGH (ref 70–99)
Glucose-Capillary: 287 mg/dL — ABNORMAL HIGH (ref 70–99)

## 2023-08-02 NOTE — Progress Notes (Signed)
Patient's daughter provided her with an uber. Discharge instructions provided and discussed with patient. All questions answered. Pt dressed self and was taken by NT to main entrance for discharge @1320 .

## 2023-08-02 NOTE — Plan of Care (Signed)
  Problem: Education: Goal: Ability to describe self-care measures that may prevent or decrease complications (Diabetes Survival Skills Education) will improve Outcome: Progressing Goal: Individualized Educational Video(s) Outcome: Progressing   Problem: Coping: Goal: Ability to adjust to condition or change in health will improve Outcome: Progressing   

## 2023-08-02 NOTE — Discharge Summary (Signed)
Physician Discharge Summary  Debbie Davis NWG:[id number] DOB: 12/21/1960 DOA: 07/31/2023  PCP: Orbie Hurst, Dorothy, PA-C (Inactive)  Admit date: 07/31/2023 Discharge date: 08/02/2023  Admitted From: Home Disposition: Home  Recommendations for Outpatient Follow-up:  Follow up with PCP Follow-up with cardiology, has seen Dr. Herbie Baltimore in 2018.  Will place new referral order to reestablish.  Discharge Condition: Stable CODE STATUS: Full code Diet recommendation: Carb modified diet  Brief/Interim Summary: From H&P: Debbie Davis is a 62 y.o. female with medical history significant of aortic atherosclerosis, hyperlipidemia, chronic diastolic heart failure, type 2 diabetes, hypertension, hypertensive heart disease, class I obesity, tobacco use who presented to the emergency department with complaints of dyspnea associated with mild pressure-like chest pain.   No palpitations, diaphoresis, PND, orthopnea, but occasionally has pitting edema of the lower extremities.  She denied fever, chills, rhinorrhea, sore throat, wheezing or hemoptysis. No abdominal pain, nausea, emesis, diarrhea, constipation, melena or hematochezia.  No flank pain, dysuria, frequency or hematuria.  No polyuria, polydipsia, polyphagia or blurred vision.    Lab work: CBC showed white count 8.1, hemoglobin 11.6 g/dL platelets 578.  Troponin was 32 and 36 then 33 ng/L and BNP 21.7 pg/mL.  Negative coronavirus, influenza and RSV PCR.  BMP showed a glucose of 393, BUN 24, creatinine 0.99 and calcium 8.8 mg/dL.  The rest of the electrolytes were normal.   Imaging: 2 view chest radiograph showing aortic atherosclerosis, but no active cardiopulmonary disease.   ED course: Initial vital signs were temperature 97.9 F, pulse 99, respiration 16, BP 152/88 mmHg O2 sat 100% on room air.  The patient received 324 mg of chewable aspirin.   Subjective on day of discharge: Patient's troponin remained flat and trend 32, 36, 33.  Chest pain  resolved.  Echocardiogram results as below.  Patient remained chest pain-free and was discharged home in stable condition.  She was advised to follow-up closely with her primary care physician for elevated blood sugar and diabetes control.  Hemoglobin A1c is pending at time of discharge.  She was also advised to reestablish with cardiology on discharge for close follow-up.  Discharge Diagnoses:   Principal Problem:   Chest pain Active Problems:   Hypertensive heart disease   Type 2 diabetes mellitus with hyperglycemia (HCC)   Atherosclerosis of aorta (HCC)   Chronic diastolic heart failure (HCC)   Obesity (BMI 30-39.9)   Tobacco use   Coronary atherosclerosis   Discharge Instructions  Discharge Instructions     (HEART FAILURE PATIENTS) Call MD:  Anytime you have any of the following symptoms: 1) 3 pound weight gain in 24 hours or 5 pounds in 1 week 2) shortness of breath, with or without a dry hacking cough 3) swelling in the hands, feet or stomach 4) if you have to sleep on extra pillows at night in order to breathe.   Complete by: As directed    Ambulatory referral to Cardiology   Complete by: As directed    Saw Dr. Herbie Baltimore back in 2018 and lost to follow up   Call MD for:  difficulty breathing, headache or visual disturbances   Complete by: As directed    Call MD for:  extreme fatigue   Complete by: As directed    Call MD for:  persistant dizziness or light-headedness   Complete by: As directed    Call MD for:  persistant nausea and vomiting   Complete by: As directed    Call MD for:  severe uncontrolled pain  Complete by: As directed    Call MD for:  temperature >100.4   Complete by: As directed    Diet - low sodium heart healthy   Complete by: As directed    Discharge instructions   Complete by: As directed    You were cared for by a hospitalist during your hospital stay. If you have any questions about your discharge medications or the care you received while you were  in the hospital after you are discharged, you can call the unit and ask to speak with the hospitalist on call if the hospitalist that took care of you is not available. Once you are discharged, your primary care physician will handle any further medical issues. Please note that NO REFILLS for any discharge medications will be authorized once you are discharged, as it is imperative that you return to your primary care physician (or establish a relationship with a primary care physician if you do not have one) for your aftercare needs so that they can reassess your need for medications and monitor your lab values.   Increase activity slowly   Complete by: As directed       Allergies as of 08/02/2023       Reactions   Morphine And Codeine Rash        Medication List     STOP taking these medications    amLODipine 10 MG tablet Commonly known as: NORVASC   benzonatate 100 MG capsule Commonly known as: TESSALON   cefadroxil 500 MG capsule Commonly known as: DURICEF   diclofenac Sodium 1 % Gel Commonly known as: Voltaren   furosemide 20 MG tablet Commonly known as: LASIX   hydrochlorothiazide 25 MG tablet Commonly known as: HYDRODIURIL   HYDROcodone-acetaminophen 5-325 MG tablet Commonly known as: NORCO/VICODIN   lidocaine 5 % Commonly known as: Lidoderm   metoprolol succinate 100 MG 24 hr tablet Commonly known as: TOPROL-XL   ondansetron 4 MG tablet Commonly known as: ZOFRAN   sodium chloride 0.65 % Soln nasal spray Commonly known as: OCEAN   triamcinolone 55 MCG/ACT Aero nasal inhaler Commonly known as: NASACORT       TAKE these medications    albuterol 108 (90 Base) MCG/ACT inhaler Commonly known as: VENTOLIN HFA Inhale 1-2 puffs into the lungs every 4 (four) hours as needed for wheezing or shortness of breath.   amLODipine-valsartan 10-160 MG tablet Commonly known as: EXFORGE Take 1 tablet by mouth daily.   Anoro Ellipta 62.5-25 MCG/INH Aepb Generic  drug: umeclidinium-vilanterol Inhale 1 puff into the lungs daily as needed (shortness of breath).   aspirin 81 MG chewable tablet Chew 81 mg by mouth daily.   atorvastatin 20 MG tablet Commonly known as: LIPITOR Take 20 mg by mouth daily.   fluticasone 50 MCG/ACT nasal spray Commonly known as: FLONASE Place 2 sprays into both nostrils daily as needed for allergies.   fluticasone-salmeterol 250-50 MCG/ACT Aepb Commonly known as: ADVAIR Inhale 1 puff into the lungs 2 (two) times daily.   gabapentin 300 MG capsule Commonly known as: NEURONTIN Take 300 mg by mouth 3 (three) times daily as needed (for pain).   glimepiride 2 MG tablet Commonly known as: AMARYL Take 2 mg by mouth daily.   metFORMIN 500 MG tablet Commonly known as: Glucophage Take 1 tablet (500 mg total) by mouth 2 (two) times daily with a meal.   metoprolol-hydrochlorothiazide 100-50 MG tablet Commonly known as: LOPRESSOR HCT Take 1 tablet by mouth daily.   montelukast 10  MG tablet Commonly known as: SINGULAIR Take 10 mg by mouth at bedtime.   multivitamin with minerals Tabs tablet Take 1 tablet by mouth daily with breakfast.   TYLENOL 500 MG tablet Generic drug: acetaminophen Take 500-1,000 mg by mouth every 6 (six) hours as needed for mild pain (pain score 1-3) or headache.        Follow-up Information     Scifres, Nicole Cella, PA-C Follow up.   Specialty: Physician Assistant Contact information: [address] [place] [address] [phone]         Marykay Lex, MD Follow up.   Specialty: Cardiology Contact information: [address] [place] [address] [phone]                Allergies  Allergen Reactions   Morphine And Codeine Rash      Procedures/Studies: ECHOCARDIOGRAM COMPLETE  Result Date: 08/01/2023    ECHOCARDIOGRAM REPORT   Patient Name:   Fabio Asa Date of Exam: 08/01/2023 Medical Rec #:  [id number]      Height:        60.0 in Accession #:    [id number]     Weight:       160.0 lb Date of Birth:  02/09/1961       BSA:          1.698 m Patient Age:    62 years       BP:           133/86 mmHg Patient Gender: F              HR:           89 bpm. Exam Location:  Inpatient Procedure: 2D Echo, Cardiac Doppler and Color Doppler Indications:    Chest pain  History:        Patient has prior history of Echocardiogram examinations, most                 recent 08/20/2016. CHF, Signs/Symptoms:Shortness of Breath,                 Dyspnea and Chest Pain; Risk Factors:Hypertension, Diabetes,                 Dyslipidemia and Obesity. Hypertensive heart disease.  Sonographer:    Milda Smart Referring Phys: [id number] CAROLE N HALL  Sonographer Comments: Suboptimal parasternal window. Image acquisition challenging due to patient body habitus. IMPRESSIONS  1. Vigorous LV function with near cavity obliteration during systole. . Left ventricular ejection fraction, by estimation, is >75%. The left ventricle has hyperdynamic function. The left ventricle has no regional wall motion abnormalities. There is mild  left ventricular hypertrophy. Left ventricular diastolic parameters are indeterminate.  2. Right ventricular systolic function is normal. The right ventricular size is normal.  3. Trivial mitral valve regurgitation.  4. The aortic valve is tricuspid. Aortic valve regurgitation is not visualized. Aortic valve sclerosis is present, with no evidence of aortic valve stenosis.  5. The inferior vena cava is normal in size with greater than 50% respiratory variability, suggesting right atrial pressure of 3 mmHg. FINDINGS  Left Ventricle: Vigorous LV function with near cavity obliteration during systole. Left ventricular ejection fraction, by estimation, is >75%. The left ventricle has hyperdynamic function. The left ventricle has no regional wall motion abnormalities. The left ventricular internal cavity size was normal in size. There is mild left  ventricular hypertrophy. Left ventricular diastolic parameters are indeterminate. Right Ventricle: The  right ventricular size is normal. Right vetricular wall thickness was not assessed. Right ventricular systolic function is normal. Left Atrium: Left atrial size was normal in size. Right Atrium: Right atrial size was normal in size. Pericardium: There is no evidence of pericardial effusion. Mitral Valve: There is mild thickening of the mitral valve leaflet(s). Trivial mitral valve regurgitation. Tricuspid Valve: The tricuspid valve is normal in structure. Tricuspid valve regurgitation is trivial. Aortic Valve: The aortic valve is tricuspid. Aortic valve regurgitation is not visualized. Aortic valve sclerosis is present, with no evidence of aortic valve stenosis. Pulmonic Valve: The pulmonic valve was not well visualized. Pulmonic valve regurgitation is not visualized. Aorta: The aortic root and ascending aorta are structurally normal, with no evidence of dilitation. Venous: The inferior vena cava is normal in size with greater than 50% respiratory variability, suggesting right atrial pressure of 3 mmHg. IAS/Shunts: No atrial level shunt detected by color flow Doppler.  LEFT VENTRICLE PLAX 2D LVIDd:         3.40 cm   Diastology LVIDs:         2.20 cm   LV e' medial:    5.98 cm/s LV PW:         1.10 cm   LV E/e' medial:  12.6 LV IVS:        1.40 cm   LV e' lateral:   6.74 cm/s LVOT diam:     1.90 cm   LV E/e' lateral: 11.2 LV SV:         75 LV SV Index:   44 LVOT Area:     2.84 cm  RIGHT VENTRICLE             IVC RV S prime:     11.60 cm/s  IVC diam: 1.30 cm TAPSE (M-mode): 1.6 cm LEFT ATRIUM           Index        RIGHT ATRIUM          Index LA diam:      2.80 cm 1.65 cm/m   RA Area:     6.65 cm LA Vol (A4C): 23.9 ml 14.08 ml/m  RA Volume:   10.06 ml 5.92 ml/m  AORTIC VALVE LVOT Vmax:   132.00 cm/s LVOT Vmean:  101.000 cm/s LVOT VTI:    0.264 m  AORTA Ao Root diam: 2.50 cm Ao Asc diam:  3.10 cm MITRAL VALVE MV  Area (PHT): 3.27 cm     SHUNTS MV Decel Time: 232 msec     Systemic VTI:  0.26 m MV E velocity: 75.50 cm/s   Systemic Diam: 1.90 cm MV A velocity: 112.00 cm/s MV E/A ratio:  0.67 Dietrich Pates MD Electronically signed by Dietrich Pates MD Signature Date/Time: 08/01/2023/4:59:14 PM    Final    DG Chest 2 View  Result Date: 08/01/2023 CLINICAL DATA:  Shortness of breath EXAM: CHEST - 2 VIEW COMPARISON:  01/26/2023 FINDINGS: Low lung volumes accentuate pulmonary vascularity and cardiomediastinal silhouette. Aortic atherosclerotic calcification. No focal consolidation, pleural effusion, or pneumothorax. No displaced rib fractures. IMPRESSION: No active cardiopulmonary disease. Electronically Signed   By: Minerva Fester M.D.   On: 08/01/2023 02:24       Discharge Exam: Vitals:   08/02/23 0518 08/02/23 0757  BP: 122/83 (!) 144/88  Pulse: 97 98  Resp:  16  Temp: 98.3 F (36.8 C) 98.6 F (37 C)  SpO2: 98% 100%    General: Pt is alert, awake, not in  acute distress Cardiovascular: RRR, S1/S2 +, no edema Respiratory: CTA bilaterally, no wheezing, no rhonchi, no respiratory distress, no conversational dyspnea  Abdominal: Soft, NT, ND, bowel sounds + Extremities: no edema, no cyanosis Psych: Normal mood and affect, stable judgement and insight     The results of significant diagnostics from this hospitalization (including imaging, microbiology, ancillary and laboratory) are listed below for reference.     Microbiology: Recent Results (from the past 240 hour(s))  Resp panel by RT-PCR (RSV, Flu A&B, Covid) Anterior Nasal Swab     Status: None   Collection Time: 08/01/23  3:29 AM   Specimen: Anterior Nasal Swab  Result Value Ref Range Status   SARS Coronavirus 2 by RT PCR NEGATIVE NEGATIVE Final    Comment: (NOTE) SARS-CoV-2 target nucleic acids are NOT DETECTED.  The SARS-CoV-2 RNA is generally detectable in upper respiratory specimens during the acute phase of infection. The  lowest concentration of SARS-CoV-2 viral copies this assay can detect is 138 copies/mL. A negative result does not preclude SARS-Cov-2 infection and should not be used as the sole basis for treatment or other patient management decisions. A negative result may occur with  improper specimen collection/handling, submission of specimen other than nasopharyngeal swab, presence of viral mutation(s) within the areas targeted by this assay, and inadequate number of viral copies(<138 copies/mL). A negative result must be combined with clinical observations, patient history, and epidemiological information. The expected result is Negative.  Fact Sheet for Patients:  BloggerCourse.com  Fact Sheet for Healthcare Providers:  SeriousBroker.it  This test is no t yet approved or cleared by the [place] FDA and  has been authorized for detection and/or diagnosis of SARS-CoV-2 by FDA under an Emergency Use Authorization (EUA). This EUA will remain  in effect (meaning this test can be used) for the duration of the COVID-19 declaration under Section 564(b)(1) of the Act, 21 U.S.C.section 360bbb-3(b)(1), unless the authorization is terminated  or revoked sooner.       Influenza A by PCR NEGATIVE NEGATIVE Final   Influenza B by PCR NEGATIVE NEGATIVE Final    Comment: (NOTE) The Xpert Xpress SARS-CoV-2/FLU/RSV plus assay is intended as an aid in the diagnosis of influenza from Nasopharyngeal swab specimens and should not be used as a sole basis for treatment. Nasal washings and aspirates are unacceptable for Xpert Xpress SARS-CoV-2/FLU/RSV testing.  Fact Sheet for Patients: BloggerCourse.com  Fact Sheet for Healthcare Providers: SeriousBroker.it  This test is not yet approved or cleared by the [place] FDA and has been authorized for detection and/or diagnosis of SARS-CoV-2 by FDA under  an Emergency Use Authorization (EUA). This EUA will remain in effect (meaning this test can be used) for the duration of the COVID-19 declaration under Section 564(b)(1) of the Act, 21 U.S.C. section 360bbb-3(b)(1), unless the authorization is terminated or revoked.     Resp Syncytial Virus by PCR NEGATIVE NEGATIVE Final    Comment: (NOTE) Fact Sheet for Patients: BloggerCourse.com  Fact Sheet for Healthcare Providers: SeriousBroker.it  This test is not yet approved or cleared by the [place] FDA and has been authorized for detection and/or diagnosis of SARS-CoV-2 by FDA under an Emergency Use Authorization (EUA). This EUA will remain in effect (meaning this test can be used) for the duration of the COVID-19 declaration under Section 564(b)(1) of the Act, 21 U.S.C. section 360bbb-3(b)(1), unless the authorization is terminated or revoked.  Performed at [facility], [address] [place], [place] [address]  Labs: BNP (last 3 results) Recent Labs    01/22/23 1021 01/26/23 1435 08/01/23 0329  BNP 8.3 14.7 21.7   Basic Metabolic Panel: Recent Labs  Lab 08/01/23 0329  NA 138  K 3.8  CL 101  CO2 24  GLUCOSE 393*  BUN 24*  CREATININE 0.99  CALCIUM 8.8*   Liver Function Tests: Recent Labs  Lab 08/01/23 0807  AST 30  ALT 25  ALKPHOS 72  BILITOT 0.5  PROT 6.5  ALBUMIN 3.1*   No results for input(s): "LIPASE", "AMYLASE" in the last 168 hours. No results for input(s): "AMMONIA" in the last 168 hours. CBC: Recent Labs  Lab 08/01/23 0329  WBC 8.1  NEUTROABS 5.2  HGB 11.6*  HCT 37.5  MCV 87.2  PLT 207   Cardiac Enzymes: No results for input(s): "CKTOTAL", "CKMB", "CKMBINDEX", "TROPONINI" in the last 168 hours. BNP: Invalid input(s): "POCBNP" CBG: Recent Labs  Lab 08/01/23 0806 08/01/23 1237 08/01/23 1755 08/01/23 2203 08/02/23 0753  GLUCAP 358* 370* 241* 206* 298*    D-Dimer No results for input(s): "DDIMER" in the last 72 hours. Hgb A1c No results for input(s): "HGBA1C" in the last 72 hours. Lipid Profile No results for input(s): "CHOL", "HDL", "LDLCALC", "TRIG", "CHOLHDL", "LDLDIRECT" in the last 72 hours. Thyroid function studies No results for input(s): "TSH", "T4TOTAL", "T3FREE", "THYROIDAB" in the last 72 hours.  Invalid input(s): "FREET3" Anemia work up No results for input(s): "VITAMINB12", "FOLATE", "FERRITIN", "TIBC", "IRON", "RETICCTPCT" in the last 72 hours. Urinalysis    Component Value Date/Time   COLORURINE YELLOW 07/19/2022 0521   APPEARANCEUR CLOUDY (A) 07/19/2022 0521   LABSPEC 1.024 07/19/2022 0521   PHURINE 5.0 07/19/2022 0521   GLUCOSEU NEGATIVE 07/19/2022 0521   HGBUR NEGATIVE 07/19/2022 0521   BILIRUBINUR NEGATIVE 07/19/2022 0521   BILIRUBINUR neg 05/03/2014 1011   KETONESUR NEGATIVE 07/19/2022 0521   PROTEINUR NEGATIVE 07/19/2022 0521   UROBILINOGEN 0.2 05/03/2014 1011   NITRITE NEGATIVE 07/19/2022 0521   LEUKOCYTESUR LARGE (A) 07/19/2022 0521   Sepsis Labs Recent Labs  Lab 08/01/23 0329  WBC 8.1   Microbiology Recent Results (from the past 240 hour(s))  Resp panel by RT-PCR (RSV, Flu A&B, Covid) Anterior Nasal Swab     Status: None   Collection Time: 08/01/23  3:29 AM   Specimen: Anterior Nasal Swab  Result Value Ref Range Status   SARS Coronavirus 2 by RT PCR NEGATIVE NEGATIVE Final    Comment: (NOTE) SARS-CoV-2 target nucleic acids are NOT DETECTED.  The SARS-CoV-2 RNA is generally detectable in upper respiratory specimens during the acute phase of infection. The lowest concentration of SARS-CoV-2 viral copies this assay can detect is 138 copies/mL. A negative result does not preclude SARS-Cov-2 infection and should not be used as the sole basis for treatment or other patient management decisions. A negative result may occur with  improper specimen collection/handling, submission of specimen  other than nasopharyngeal swab, presence of viral mutation(s) within the areas targeted by this assay, and inadequate number of viral copies(<138 copies/mL). A negative result must be combined with clinical observations, patient history, and epidemiological information. The expected result is Negative.  Fact Sheet for Patients:  BloggerCourse.com  Fact Sheet for Healthcare Providers:  SeriousBroker.it  This test is no t yet approved or cleared by the [place] FDA and  has been authorized for detection and/or diagnosis of SARS-CoV-2 by FDA under an Emergency Use Authorization (EUA). This EUA will remain  in effect (meaning this test can be used)  for the duration of the COVID-19 declaration under Section 564(b)(1) of the Act, 21 U.S.C.section 360bbb-3(b)(1), unless the authorization is terminated  or revoked sooner.       Influenza A by PCR NEGATIVE NEGATIVE Final   Influenza B by PCR NEGATIVE NEGATIVE Final    Comment: (NOTE) The Xpert Xpress SARS-CoV-2/FLU/RSV plus assay is intended as an aid in the diagnosis of influenza from Nasopharyngeal swab specimens and should not be used as a sole basis for treatment. Nasal washings and aspirates are unacceptable for Xpert Xpress SARS-CoV-2/FLU/RSV testing.  Fact Sheet for Patients: BloggerCourse.com  Fact Sheet for Healthcare Providers: SeriousBroker.it  This test is not yet approved or cleared by the [place] FDA and has been authorized for detection and/or diagnosis of SARS-CoV-2 by FDA under an Emergency Use Authorization (EUA). This EUA will remain in effect (meaning this test can be used) for the duration of the COVID-19 declaration under Section 564(b)(1) of the Act, 21 U.S.C. section 360bbb-3(b)(1), unless the authorization is terminated or revoked.     Resp Syncytial Virus by PCR NEGATIVE NEGATIVE Final     Comment: (NOTE) Fact Sheet for Patients: BloggerCourse.com  Fact Sheet for Healthcare Providers: SeriousBroker.it  This test is not yet approved or cleared by the [place] FDA and has been authorized for detection and/or diagnosis of SARS-CoV-2 by FDA under an Emergency Use Authorization (EUA). This EUA will remain in effect (meaning this test can be used) for the duration of the COVID-19 declaration under Section 564(b)(1) of the Act, 21 U.S.C. section 360bbb-3(b)(1), unless the authorization is terminated or revoked.  Performed at [facility], [address] [place], [place] [address]      Patient was seen and examined on the day of discharge and was found to be in stable condition. Time coordinating discharge: 35 minutes including assessment and coordination of care, as well as examination of the patient.   SIGNED:  Noralee Stain, DO [facility] 08/02/2023, 10:32 AM

## 2023-08-02 NOTE — Progress Notes (Signed)
Patient reports her ride will be here after 3 pm, will contact [facility] to help with transportation needs.

## 2023-08-04 LAB — HEMOGLOBIN A1C
Hgb A1c MFr Bld: 13 % — ABNORMAL HIGH (ref 4.8–5.6)
Mean Plasma Glucose: 326 mg/dL

## 2023-08-05 NOTE — Progress Notes (Deleted)
Office Note     CC: Pain and burning in the feet Requesting Provider:  Sarita Bottom, NP  HPI: Debbie Davis is a 62 y.o. ([birth year]) female presenting at the request of .Patient, No Pcp Per ***  The pt is *** on a statin for cholesterol management.  The pt is *** on a daily aspirin.   Other AC:  *** The pt is *** on medication for hypertension.   The pt is *** diabetic.  Tobacco hx:  ***  Past Medical History:  Diagnosis Date   Atherosclerosis of aorta (HCC) 11/25/2016   Chronic diastolic heart failure (HCC) 11/25/2016   Diabetes type 2, uncontrolled 08/22/2016   Hypertension    Hypertensive heart disease 08/20/2016   Obesity (BMI 30-39.9) 11/25/2016    Past Surgical History:  Procedure Laterality Date   KIDNEY SURGERY Right    removal of kidney stone   RIGHT/LEFT HEART CATH AND CORONARY ANGIOGRAPHY N/A 11/26/2016   Procedure: Right/Left Heart Cath and Coronary Angiography;  Surgeon: Marykay Lex, MD;  Location: [facility] INVASIVE CV LAB;  Service: Cardiovascular;  Laterality: N/A;   TUBAL LIGATION      Social History   Socioeconomic History   Marital status: Single    Spouse name: Not on file   Number of children: Not on file   Years of education: Not on file   Highest education level: Not on file  Occupational History   Not on file  Tobacco Use   Smoking status: Every Day    Current packs/day: 0.50    Average packs/day: 0.5 packs/day for 30.0 years (15.0 ttl pk-yrs)    Types: Cigarettes   Smokeless tobacco: Never  Vaping Use   Vaping status: Never Used  Substance and Sexual Activity   Alcohol use: Yes   Drug use: No   Sexual activity: Not on file  Other Topics Concern   Not on file  Social History Narrative   Not on file   Social Determinants of Health   Financial Resource Strain: Not on file  Food Insecurity: No Food Insecurity (08/01/2023)   Hunger Vital Sign    Worried About Running Out of Food in the Last Year: Never true    Ran Out of Food in the  Last Year: Never true  Transportation Needs: No Transportation Needs (08/01/2023)   PRAPARE - Administrator, Civil Service (Medical): No    Lack of Transportation (Non-Medical): No  Physical Activity: Not on file  Stress: Not on file  Social Connections: Not on file  Intimate Partner Violence: Not At Risk (08/01/2023)   Humiliation, Afraid, Rape, and Kick questionnaire    Fear of Current or Ex-Partner: No    Emotionally Abused: No    Physically Abused: No    Sexually Abused: No   *** Family History  Problem Relation Age of Onset   Cancer Mother    Heart disease Father    Diabetes Sister    Diabetes Brother    Diabetes Sister    Diabetes Sister    Diabetes Sister     Current Outpatient Medications  Medication Sig Dispense Refill   albuterol (PROVENTIL HFA;VENTOLIN HFA) 108 (90 Base) MCG/ACT inhaler Inhale 1-2 puffs into the lungs every 4 (four) hours as needed for wheezing or shortness of breath. 1 Inhaler 0   amLODipine-valsartan (EXFORGE) 10-160 MG tablet Take 1 tablet by mouth daily.     aspirin 81 MG chewable tablet Chew 81 mg by mouth daily.  atorvastatin (LIPITOR) 20 MG tablet Take 20 mg by mouth daily.     fluticasone (FLONASE) 50 MCG/ACT nasal spray Place 2 sprays into both nostrils daily as needed for allergies.     fluticasone-salmeterol (ADVAIR) 250-50 MCG/ACT AEPB Inhale 1 puff into the lungs 2 (two) times daily.     gabapentin (NEURONTIN) 300 MG capsule Take 300 mg by mouth 3 (three) times daily as needed (for pain).     glimepiride (AMARYL) 2 MG tablet Take 2 mg by mouth daily.     metFORMIN (GLUCOPHAGE) 500 MG tablet Take 1 tablet (500 mg total) by mouth 2 (two) times daily with a meal. 60 tablet 1   metoprolol-hydrochlorothiazide (LOPRESSOR HCT) 100-50 MG tablet Take 1 tablet by mouth daily.     montelukast (SINGULAIR) 10 MG tablet Take 10 mg by mouth at bedtime.     Multiple Vitamin (MULTIVITAMIN WITH MINERALS) TABS tablet Take 1 tablet by mouth  daily with breakfast.     TYLENOL 500 MG tablet Take 500-1,000 mg by mouth every 6 (six) hours as needed for mild pain (pain score 1-3) or headache.     umeclidinium-vilanterol (ANORO ELLIPTA) 62.5-25 MCG/INH AEPB Inhale 1 puff into the lungs daily as needed (shortness of breath).      No current facility-administered medications for this visit.    Allergies  Allergen Reactions   Morphine And Codeine Rash     REVIEW OF SYSTEMS:  *** [X]  denotes positive finding, [ ]  denotes negative finding Cardiac  Comments:  Chest pain or chest pressure:    Shortness of breath upon exertion:    Short of breath when lying flat:    Irregular heart rhythm:        Vascular    Pain in calf, thigh, or hip brought on by ambulation:    Pain in feet at night that wakes you up from your sleep:     Blood clot in your veins:    Leg swelling:         Pulmonary    Oxygen at home:    Productive cough:     Wheezing:         Neurologic    Sudden weakness in arms or legs:     Sudden numbness in arms or legs:     Sudden onset of difficulty speaking or slurred speech:    Temporary loss of vision in one eye:     Problems with dizziness:         Gastrointestinal    Blood in stool:     Vomited blood:         Genitourinary    Burning when urinating:     Blood in urine:        Psychiatric    Major depression:         Hematologic    Bleeding problems:    Problems with blood clotting too easily:        Skin    Rashes or ulcers:        Constitutional    Fever or chills:      PHYSICAL EXAMINATION:  There were no vitals filed for this visit.  General:  WDWN in NAD; vital signs documented above Gait: Not observed HENT: WNL, normocephalic Pulmonary: normal non-labored breathing , without wheezing Cardiac: {Desc; regular/irreg:14544} HR Abdomen: soft, NT, no masses Skin: {With/Without:20273} rashes Vascular Exam/Pulses:  Right Left  Radial {Exam; arterial pulse strength 0-4:30167} {Exam;  arterial pulse strength 0-4:30167}  Ulnar {Exam;  arterial pulse strength 0-4:30167} {Exam; arterial pulse strength 0-4:30167}  Femoral {Exam; arterial pulse strength 0-4:30167} {Exam; arterial pulse strength 0-4:30167}  Popliteal {Exam; arterial pulse strength 0-4:30167} {Exam; arterial pulse strength 0-4:30167}  DP {Exam; arterial pulse strength 0-4:30167} {Exam; arterial pulse strength 0-4:30167}  PT {Exam; arterial pulse strength 0-4:30167} {Exam; arterial pulse strength 0-4:30167}   Extremities: {With/Without:20273} ischemic changes, {With/Without:20273} Gangrene , {With/Without:20273} cellulitis; {With/Without:20273} open wounds;  Musculoskeletal: no muscle wasting or atrophy  Neurologic: A&O X 3;  No focal weakness or paresthesias are detected Psychiatric:  The pt has {Desc; normal/abnormal:11317::"Normal"} affect.   Non-Invasive Vascular Imaging:   ***    ASSESSMENT/PLAN: Debbie Davis is a 62 y.o. female presenting with ***   ***   Victorino Sparrow, MD [facility] [phone]

## 2023-08-07 ENCOUNTER — Ambulatory Visit (HOSPITAL_COMMUNITY): Payer: Medicare HMO | Attending: Vascular Surgery

## 2023-08-07 ENCOUNTER — Encounter: Payer: Medicare HMO | Admitting: Vascular Surgery

## 2023-10-08 ENCOUNTER — Ambulatory Visit: Payer: Medicare HMO | Attending: Cardiology | Admitting: Cardiology

## 2023-10-30 ENCOUNTER — Emergency Department (HOSPITAL_COMMUNITY): Payer: Medicare HMO

## 2023-10-30 ENCOUNTER — Encounter (HOSPITAL_COMMUNITY): Payer: Self-pay

## 2023-10-30 ENCOUNTER — Inpatient Hospital Stay (HOSPITAL_COMMUNITY)
Admission: EM | Admit: 2023-10-30 | Discharge: 2023-11-02 | DRG: 193 | Disposition: A | Discharge status: Home / Self Care | Payer: Medicare HMO | Attending: Family Medicine | Admitting: Family Medicine

## 2023-10-30 ENCOUNTER — Other Ambulatory Visit: Payer: Self-pay

## 2023-10-30 DIAGNOSIS — N183 Chronic kidney disease, stage 3 unspecified: Secondary | ICD-10-CM | POA: Insufficient documentation

## 2023-10-30 DIAGNOSIS — E1165 Type 2 diabetes mellitus with hyperglycemia: Secondary | ICD-10-CM | POA: Diagnosis not present

## 2023-10-30 DIAGNOSIS — Z720 Tobacco use: Secondary | ICD-10-CM | POA: Diagnosis present

## 2023-10-30 DIAGNOSIS — N182 Chronic kidney disease, stage 2 (mild): Secondary | ICD-10-CM | POA: Diagnosis present

## 2023-10-30 DIAGNOSIS — R000 Tachycardia, unspecified: Secondary | ICD-10-CM | POA: Insufficient documentation

## 2023-10-30 DIAGNOSIS — R7989 Other specified abnormal findings of blood chemistry: Secondary | ICD-10-CM | POA: Insufficient documentation

## 2023-10-30 DIAGNOSIS — J189 Pneumonia, unspecified organism: Secondary | ICD-10-CM | POA: Diagnosis present

## 2023-10-30 DIAGNOSIS — E1151 Type 2 diabetes mellitus with diabetic peripheral angiopathy without gangrene: Secondary | ICD-10-CM | POA: Diagnosis present

## 2023-10-30 DIAGNOSIS — Z6832 Body mass index (BMI) 32.0-32.9, adult: Secondary | ICD-10-CM

## 2023-10-30 DIAGNOSIS — E1122 Type 2 diabetes mellitus with diabetic chronic kidney disease: Secondary | ICD-10-CM | POA: Diagnosis present

## 2023-10-30 DIAGNOSIS — Z1152 Encounter for screening for COVID-19: Secondary | ICD-10-CM

## 2023-10-30 DIAGNOSIS — Z885 Allergy status to narcotic agent status: Secondary | ICD-10-CM

## 2023-10-30 DIAGNOSIS — E669 Obesity, unspecified: Secondary | ICD-10-CM | POA: Diagnosis present

## 2023-10-30 DIAGNOSIS — R079 Chest pain, unspecified: Principal | ICD-10-CM

## 2023-10-30 DIAGNOSIS — I700 Atherosclerosis of aorta: Secondary | ICD-10-CM | POA: Diagnosis present

## 2023-10-30 DIAGNOSIS — I5032 Chronic diastolic (congestive) heart failure: Secondary | ICD-10-CM | POA: Diagnosis present

## 2023-10-30 DIAGNOSIS — Z833 Family history of diabetes mellitus: Secondary | ICD-10-CM

## 2023-10-30 DIAGNOSIS — J1000 Influenza due to other identified influenza virus with unspecified type of pneumonia: Principal | ICD-10-CM | POA: Diagnosis present

## 2023-10-30 DIAGNOSIS — F17210 Nicotine dependence, cigarettes, uncomplicated: Secondary | ICD-10-CM | POA: Diagnosis present

## 2023-10-30 DIAGNOSIS — E785 Hyperlipidemia, unspecified: Secondary | ICD-10-CM | POA: Diagnosis present

## 2023-10-30 DIAGNOSIS — Z7982 Long term (current) use of aspirin: Secondary | ICD-10-CM

## 2023-10-30 DIAGNOSIS — J9601 Acute respiratory failure with hypoxia: Secondary | ICD-10-CM | POA: Diagnosis not present

## 2023-10-30 DIAGNOSIS — E1142 Type 2 diabetes mellitus with diabetic polyneuropathy: Secondary | ICD-10-CM | POA: Diagnosis present

## 2023-10-30 DIAGNOSIS — Z794 Long term (current) use of insulin: Secondary | ICD-10-CM | POA: Diagnosis not present

## 2023-10-30 DIAGNOSIS — Z7984 Long term (current) use of oral hypoglycemic drugs: Secondary | ICD-10-CM

## 2023-10-30 DIAGNOSIS — I2510 Atherosclerotic heart disease of native coronary artery without angina pectoris: Secondary | ICD-10-CM | POA: Diagnosis present

## 2023-10-30 DIAGNOSIS — Z79899 Other long term (current) drug therapy: Secondary | ICD-10-CM

## 2023-10-30 DIAGNOSIS — I119 Hypertensive heart disease without heart failure: Secondary | ICD-10-CM | POA: Diagnosis present

## 2023-10-30 DIAGNOSIS — N179 Acute kidney failure, unspecified: Secondary | ICD-10-CM | POA: Diagnosis present

## 2023-10-30 DIAGNOSIS — J45909 Unspecified asthma, uncomplicated: Secondary | ICD-10-CM | POA: Diagnosis present

## 2023-10-30 DIAGNOSIS — J101 Influenza due to other identified influenza virus with other respiratory manifestations: Secondary | ICD-10-CM | POA: Diagnosis not present

## 2023-10-30 DIAGNOSIS — Z7951 Long term (current) use of inhaled steroids: Secondary | ICD-10-CM

## 2023-10-30 DIAGNOSIS — I272 Pulmonary hypertension, unspecified: Secondary | ICD-10-CM | POA: Diagnosis present

## 2023-10-30 DIAGNOSIS — Z8249 Family history of ischemic heart disease and other diseases of the circulatory system: Secondary | ICD-10-CM

## 2023-10-30 DIAGNOSIS — I130 Hypertensive heart and chronic kidney disease with heart failure and stage 1 through stage 4 chronic kidney disease, or unspecified chronic kidney disease: Secondary | ICD-10-CM | POA: Diagnosis present

## 2023-10-30 DIAGNOSIS — Z809 Family history of malignant neoplasm, unspecified: Secondary | ICD-10-CM

## 2023-10-30 DIAGNOSIS — Z87442 Personal history of urinary calculi: Secondary | ICD-10-CM

## 2023-10-30 DIAGNOSIS — I2489 Other forms of acute ischemic heart disease: Secondary | ICD-10-CM | POA: Diagnosis present

## 2023-10-30 LAB — BRAIN NATRIURETIC PEPTIDE: B Natriuretic Peptide: 45.7 pg/mL (ref 0.0–100.0)

## 2023-10-30 LAB — TROPONIN I (HIGH SENSITIVITY)
Troponin I (High Sensitivity): 35 ng/L — ABNORMAL HIGH (ref <18)
Troponin I (High Sensitivity): 36 ng/L — ABNORMAL HIGH (ref <18)

## 2023-10-30 LAB — CBC
HCT: 39.8 % (ref 36.0–46.0)
Hemoglobin: 12.6 g/dL (ref 12.0–15.0)
MCH: 27.8 pg (ref 26.0–34.0)
MCHC: 31.7 g/dL (ref 30.0–36.0)
MCV: 87.7 fL (ref 80.0–100.0)
Platelets: 197 10*3/uL (ref 150–400)
RBC: 4.54 MIL/uL (ref 3.87–5.11)
RDW: 14.8 % (ref 11.5–15.5)
WBC: 8.5 10*3/uL (ref 4.0–10.5)
nRBC: 0 % (ref 0.0–0.2)

## 2023-10-30 LAB — BASIC METABOLIC PANEL
Anion gap: 13 (ref 5–15)
BUN: 20 mg/dL (ref 8–23)
CO2: 23 mmol/L (ref 22–32)
Calcium: 9.1 mg/dL (ref 8.9–10.3)
Chloride: 104 mmol/L (ref 98–111)
Creatinine, Ser: 1.23 mg/dL — ABNORMAL HIGH (ref 0.44–1.00)
GFR, Estimated: 50 mL/min — ABNORMAL LOW (ref >60)
Glucose, Bld: 203 mg/dL — ABNORMAL HIGH (ref 70–99)
Potassium: 3.3 mmol/L — ABNORMAL LOW (ref 3.5–5.1)
Sodium: 140 mmol/L (ref 135–145)

## 2023-10-30 LAB — RESP PANEL BY RT-PCR (RSV, FLU A&B, COVID)  RVPGX2
Influenza A by PCR: POSITIVE — AB
Influenza B by PCR: NEGATIVE
Resp Syncytial Virus by PCR: NEGATIVE
SARS Coronavirus 2 by RT PCR: NEGATIVE

## 2023-10-30 MED ORDER — GABAPENTIN 300 MG PO CAPS
300.0000 mg | ORAL_CAPSULE | Freq: Three times a day (TID) | ORAL | Status: DC | PRN
  Administered 2023-11-01: 300 mg via ORAL
  Filled 2023-10-30: qty 1

## 2023-10-30 MED ORDER — ACETAMINOPHEN 325 MG PO TABS
650.0000 mg | ORAL_TABLET | Freq: Once | ORAL | Status: AC | PRN
  Administered 2023-10-30: 650 mg via ORAL
  Filled 2023-10-30: qty 2

## 2023-10-30 MED ORDER — IPRATROPIUM-ALBUTEROL 0.5-2.5 (3) MG/3ML IN SOLN
9.0000 mL | Freq: Once | RESPIRATORY_TRACT | Status: AC
  Administered 2023-10-30: 9 mL via RESPIRATORY_TRACT
  Filled 2023-10-30: qty 3

## 2023-10-30 MED ORDER — OXYCODONE HCL 5 MG PO TABS
5.0000 mg | ORAL_TABLET | Freq: Once | ORAL | Status: AC
  Administered 2023-10-30: 5 mg via ORAL
  Filled 2023-10-30: qty 1

## 2023-10-30 MED ORDER — AZITHROMYCIN 500 MG PO TABS
500.0000 mg | ORAL_TABLET | Freq: Every day | ORAL | Status: DC
  Administered 2023-10-31 (×2): 500 mg via ORAL
  Filled 2023-10-30: qty 1
  Filled 2023-10-30: qty 2
  Filled 2023-10-30: qty 1

## 2023-10-30 MED ORDER — ADULT MULTIVITAMIN W/MINERALS CH
1.0000 | ORAL_TABLET | Freq: Every day | ORAL | Status: DC
  Administered 2023-10-31 – 2023-11-02 (×3): 1 via ORAL
  Filled 2023-10-30 (×4): qty 1

## 2023-10-30 MED ORDER — SODIUM CHLORIDE 0.9 % IV SOLN
2.0000 g | INTRAVENOUS | Status: DC
  Administered 2023-10-31 (×2): 2 g via INTRAVENOUS
  Filled 2023-10-30 (×2): qty 20

## 2023-10-30 MED ORDER — AMLODIPINE BESYLATE-VALSARTAN 10-160 MG PO TABS: 1.0000 | ORAL_TABLET | Freq: Every day | ORAL | Status: DC

## 2023-10-30 MED ORDER — METOPROLOL-HYDROCHLOROTHIAZIDE 100-50 MG PO TABS: 1.0000 | ORAL_TABLET | Freq: Every day | ORAL | Status: DC

## 2023-10-30 MED ORDER — MOMETASONE FURO-FORMOTEROL FUM 200-5 MCG/ACT IN AERO
2.0000 | INHALATION_SPRAY | Freq: Two times a day (BID) | RESPIRATORY_TRACT | Status: DC
  Administered 2023-10-31 – 2023-11-02 (×4): 2 via RESPIRATORY_TRACT
  Filled 2023-10-30: qty 8.8

## 2023-10-30 MED ORDER — ENOXAPARIN SODIUM 40 MG/0.4ML IJ SOSY
40.0000 mg | PREFILLED_SYRINGE | INTRAMUSCULAR | Status: DC
  Filled 2023-10-30: qty 0.4

## 2023-10-30 MED ORDER — ACETAMINOPHEN 500 MG PO TABS: 1000.0000 mg | ORAL_TABLET | Freq: Once | ORAL | Status: DC

## 2023-10-30 MED ORDER — OSELTAMIVIR PHOSPHATE 75 MG PO CAPS
75.0000 mg | ORAL_CAPSULE | ORAL | Status: AC
  Administered 2023-10-31: 75 mg via ORAL
  Filled 2023-10-30: qty 1

## 2023-10-30 MED ORDER — MONTELUKAST SODIUM 10 MG PO TABS
10.0000 mg | ORAL_TABLET | Freq: Every day | ORAL | Status: DC
  Administered 2023-10-31 – 2023-11-01 (×3): 10 mg via ORAL
  Filled 2023-10-30 (×3): qty 1

## 2023-10-30 MED ORDER — ACETAMINOPHEN 650 MG RE SUPP: 650.0000 mg | Freq: Four times a day (QID) | RECTAL | Status: DC | PRN

## 2023-10-30 MED ORDER — ACETAMINOPHEN 325 MG PO TABS
650.0000 mg | ORAL_TABLET | Freq: Four times a day (QID) | ORAL | Status: DC | PRN
  Administered 2023-10-31 – 2023-11-01 (×3): 650 mg via ORAL
  Filled 2023-10-30 (×3): qty 2

## 2023-10-30 MED ORDER — ATORVASTATIN CALCIUM 40 MG PO TABS
40.0000 mg | ORAL_TABLET | Freq: Every day | ORAL | Status: DC
Start: 2023-10-31 — End: 2023-11-02
  Administered 2023-10-31 – 2023-11-02 (×3): 40 mg via ORAL
  Filled 2023-10-30 (×3): qty 1

## 2023-10-30 MED ORDER — GABAPENTIN 300 MG PO CAPS
600.0000 mg | ORAL_CAPSULE | Freq: Once | ORAL | Status: AC
  Administered 2023-10-30: 600 mg via ORAL
  Filled 2023-10-30: qty 2

## 2023-10-30 MED ORDER — IOHEXOL 350 MG/ML SOLN
75.0000 mL | Freq: Once | INTRAVENOUS | Status: AC | PRN
  Administered 2023-10-30: 75 mL via INTRAVENOUS

## 2023-10-30 MED ORDER — ASPIRIN 81 MG PO CHEW
81.0000 mg | CHEWABLE_TABLET | Freq: Every day | ORAL | Status: DC
  Administered 2023-10-31 – 2023-11-02 (×3): 81 mg via ORAL
  Filled 2023-10-30 (×3): qty 1

## 2023-10-30 NOTE — ED Triage Notes (Addendum)
Patient BIB [facility]. Feels short of breath when she lays flat. Complaining of chest pain under left breast. Used inhaler last night and this morning with no relief. Pain when she coughs. Headache for 3 days.   EMS 20G left AC 5 albuterol 0.5mg  atrovent 125mg  solumedrol 2G mag

## 2023-10-30 NOTE — ED Provider Triage Note (Signed)
Emergency Medicine Provider Triage Evaluation Note  Debbie Davis , a 63 y.o. female  was evaluated in triage.  Pt complains of chest pain and shortness of breath.  She reports that she has been feeling unwell for last 3 days with subjective fever and chills at home.  Also noticing some worsening orthopnea.  Does endorse that she has a history of CHF but states that she is not current on any diuretic.  She has endorsed some mild leg swelling over the last several days.  Review of Systems  Positive: As above Negative: As above  Physical Exam  BP 126/74 (BP Location: Left Arm)   Pulse (!) 123   Temp (!) 100.4 F (38 C) (Oral)   Resp 20   Ht 5' (1.524 m)   Wt 75.3 kg   SpO2 98%   BMI 32.42 kg/m  Gen:   Awake, no distress   Resp:  Slightly increased work of breathing and rhonchorous lung sounds but no rales or wheezing noted MSK:   Moves extremities without difficulty  Other:  Trace edema in bilateral lower extremities  Medical Decision Making  Medically screening exam initiated at 12:27 PM.  Appropriate orders placed.  Debbie Davis was informed that the remainder of the evaluation will be completed by another provider, this initial triage assessment does not replace that evaluation, and the importance of remaining in the ED until their evaluation is complete.     Smitty Knudsen, PA-C 10/30/23 1228

## 2023-10-30 NOTE — H&P (Signed)
History and Physical    Debbie Davis ZOX:[id number] DOB: [birth year] DOA: 10/30/2023  Patient coming from: Home.  Chief Complaint: Shortness of breath.  HPI: Debbie PIGGOTT is a 63 y.o. female with history of diastolic CHF, hypertension, diabetes mellitus type 2, ongoing tobacco abuse with possible COPD presents to the ER with complaints of having shortness of breath productive cough with left-sided chest pain.  Symptoms have been persistent.  So patient presents to the ER.  ED Course: In the ER patient is tachypneic and tachycardic.  Patient was febrile with temperature of 100 F.  Influenza A came back positive.  Patient was requiring 2 L oxygen to maintain sats.  Troponins were 35 and 36 proBNP was 45.7.  Creatinine 1.2 WBC 8.5.  Patient admitted for acute respiratory failure with hypoxia in the setting of influenza A positive.  Review of Systems: As per HPI, rest all negative.   Past Medical History:  Diagnosis Date   Atherosclerosis of aorta (HCC) 11/25/2016   Chronic diastolic heart failure (HCC) 11/25/2016   Diabetes type 2, uncontrolled 08/22/2016   Hypertension    Hypertensive heart disease 08/20/2016   Obesity (BMI 30-39.9) 11/25/2016    Past Surgical History:  Procedure Laterality Date   KIDNEY SURGERY Right    removal of kidney stone   RIGHT/LEFT HEART CATH AND CORONARY ANGIOGRAPHY N/A 11/26/2016   Procedure: Right/Left Heart Cath and Coronary Angiography;  Surgeon: Marykay Lex, MD;  Location: [facility] INVASIVE CV LAB;  Service: Cardiovascular;  Laterality: N/A;   TUBAL LIGATION       reports that she has been smoking cigarettes. She has a 15 pack-year smoking history. She has never used smokeless tobacco. She reports current alcohol use. She reports that she does not use drugs.  Allergies  Allergen Reactions   Morphine And Codeine Rash    Family History  Problem Relation Age of Onset   Cancer Mother    Heart disease Father    Diabetes Sister    Diabetes Brother     Diabetes Sister    Diabetes Sister    Diabetes Sister     Prior to Admission medications   Medication Sig Start Date End Date Taking? Authorizing Provider  albuterol (PROVENTIL HFA;VENTOLIN HFA) 108 (90 Base) MCG/ACT inhaler Inhale 1-2 puffs into the lungs every 4 (four) hours as needed for wheezing or shortness of breath. 08/12/16   Cheri Fowler, PA-C  amLODipine-valsartan (EXFORGE) 10-160 MG tablet Take 1 tablet by mouth daily. 05/16/23   [provider]  aspirin 81 MG chewable tablet Chew 81 mg by mouth daily.    [provider]  atorvastatin (LIPITOR) 20 MG tablet Take 20 mg by mouth daily.    [provider]  fluticasone (FLONASE) 50 MCG/ACT nasal spray Place 2 sprays into both nostrils daily as needed for allergies. 05/16/23   [provider]  fluticasone-salmeterol (ADVAIR) 250-50 MCG/ACT AEPB Inhale 1 puff into the lungs 2 (two) times daily. 06/13/23   [provider]  gabapentin (NEURONTIN) 300 MG capsule Take 300 mg by mouth 3 (three) times daily as needed (for pain). 07/15/23   [provider]  glimepiride (AMARYL) 2 MG tablet Take 2 mg by mouth daily. 05/17/23   [provider]  metFORMIN (GLUCOPHAGE) 500 MG tablet Take 1 tablet (500 mg total) by mouth 2 (two) times daily with a meal. 07/12/20   Rolan Bucco, MD  metoprolol-hydrochlorothiazide (LOPRESSOR HCT) 100-50 MG tablet Take 1 tablet by mouth daily.  [provider]  montelukast (SINGULAIR) 10 MG tablet Take 10 mg by mouth at bedtime. 05/29/23   [provider]  Multiple Vitamin (MULTIVITAMIN WITH MINERALS) TABS tablet Take 1 tablet by mouth daily with breakfast.    [provider]  TYLENOL 500 MG tablet Take 500-1,000 mg by mouth every 6 (six) hours as needed for mild pain (pain score 1-3) or headache.    [provider]  umeclidinium-vilanterol (ANORO ELLIPTA) 62.5-25 MCG/INH AEPB Inhale 1 puff into the lungs daily as needed  (shortness of breath).     [provider]    Physical Exam: Constitutional: Moderately built and nourished. Vitals:   10/30/23 2200 10/30/23 2211 10/30/23 2227 10/30/23 2230  BP: 113/74   116/69  Pulse: (!) 120 (!) 128 (!) 112 (!) 110  Resp:  16 (!) 21 20  Temp: 98.4 F (36.9 C)     TempSrc: Oral     SpO2: 93% 99% 98% 98%  Weight:      Height:       Eyes: Anicteric no pallor. ENMT: No discharge from the ears eyes nose or mouth. Neck: No mass felt.  No neck rigidity. Respiratory: No rhonchi or crepitations. Cardiovascular: S1-S2 heard. Abdomen: Soft nontender bowel sound present. Musculoskeletal: No edema. Skin: No rash. Neurologic: Alert awake oriented to time place and person.  Moves all extremities. Psychiatric: Appears normal.  Normal affect.   Labs on Admission: I have personally reviewed following labs and imaging studies  CBC: Recent Labs  Lab 10/30/23 1149  WBC 8.5  HGB 12.6  HCT 39.8  MCV 87.7  PLT 197   Basic Metabolic Panel: Recent Labs  Lab 10/30/23 1149  NA 140  K 3.3*  CL 104  CO2 23  GLUCOSE 203*  BUN 20  CREATININE 1.23*  CALCIUM 9.1   GFR: Estimated Creatinine Clearance: 43 mL/min (A) (by C-G formula based on SCr of 1.23 mg/dL (H)). Liver Function Tests: No results for input(s): "AST", "ALT", "ALKPHOS", "BILITOT", "PROT", "ALBUMIN" in the last 168 hours. No results for input(s): "LIPASE", "AMYLASE" in the last 168 hours. No results for input(s): "AMMONIA" in the last 168 hours. Coagulation Profile: No results for input(s): "INR", "PROTIME" in the last 168 hours. Cardiac Enzymes: No results for input(s): "CKTOTAL", "CKMB", "CKMBINDEX", "TROPONINI" in the last 168 hours. BNP (last 3 results) No results for input(s): "PROBNP" in the last 8760 hours. HbA1C: No results for input(s): "HGBA1C" in the last 72 hours. CBG: No results for input(s): "GLUCAP" in the last 168 hours. Lipid Profile: No results for input(s): "CHOL",  "HDL", "LDLCALC", "TRIG", "CHOLHDL", "LDLDIRECT" in the last 72 hours. Thyroid Function Tests: No results for input(s): "TSH", "T4TOTAL", "FREET4", "T3FREE", "THYROIDAB" in the last 72 hours. Anemia Panel: No results for input(s): "VITAMINB12", "FOLATE", "FERRITIN", "TIBC", "IRON", "RETICCTPCT" in the last 72 hours. Urine analysis:    Component Value Date/Time   COLORURINE YELLOW 07/19/2022 0521   APPEARANCEUR CLOUDY (A) 07/19/2022 0521   LABSPEC 1.024 07/19/2022 0521   PHURINE 5.0 07/19/2022 0521   GLUCOSEU NEGATIVE 07/19/2022 0521   HGBUR NEGATIVE 07/19/2022 0521   BILIRUBINUR NEGATIVE 07/19/2022 0521   BILIRUBINUR neg 05/03/2014 1011   KETONESUR NEGATIVE 07/19/2022 0521   PROTEINUR NEGATIVE 07/19/2022 0521   UROBILINOGEN 0.2 05/03/2014 1011   NITRITE NEGATIVE 07/19/2022 0521   LEUKOCYTESUR LARGE (A) 07/19/2022 0521   Sepsis Labs: @LABRCNTIP (procalcitonin:4,lacticidven:4) ) Recent Results (from the past 240 hours)  Resp panel by RT-PCR (RSV, Flu A&B, Covid) Anterior Nasal Swab  Status: Abnormal   Collection Time: 10/30/23 12:39 PM   Specimen: Anterior Nasal Swab  Result Value Ref Range Status   SARS Coronavirus 2 by RT PCR NEGATIVE NEGATIVE Final    Comment: (NOTE) SARS-CoV-2 target nucleic acids are NOT DETECTED.  The SARS-CoV-2 RNA is generally detectable in upper respiratory specimens during the acute phase of infection. The lowest concentration of SARS-CoV-2 viral copies this assay can detect is 138 copies/mL. A negative result does not preclude SARS-Cov-2 infection and should not be used as the sole basis for treatment or other patient management decisions. A negative result may occur with  improper specimen collection/handling, submission of specimen other than nasopharyngeal swab, presence of viral mutation(s) within the areas targeted by this assay, and inadequate number of viral copies(<138 copies/mL). A negative result must be combined with clinical  observations, patient history, and epidemiological information. The expected result is Negative.  Fact Sheet for Patients:  BloggerCourse.com  Fact Sheet for Healthcare Providers:  SeriousBroker.it  This test is no t yet approved or cleared by the [place] FDA and  has been authorized for detection and/or diagnosis of SARS-CoV-2 by FDA under an Emergency Use Authorization (EUA). This EUA will remain  in effect (meaning this test can be used) for the duration of the COVID-19 declaration under Section 564(b)(1) of the Act, 21 U.S.C.section 360bbb-3(b)(1), unless the authorization is terminated  or revoked sooner.       Influenza A by PCR POSITIVE (A) NEGATIVE Final   Influenza B by PCR NEGATIVE NEGATIVE Final    Comment: (NOTE) The Xpert Xpress SARS-CoV-2/FLU/RSV plus assay is intended as an aid in the diagnosis of influenza from Nasopharyngeal swab specimens and should not be used as a sole basis for treatment. Nasal washings and aspirates are unacceptable for Xpert Xpress SARS-CoV-2/FLU/RSV testing.  Fact Sheet for Patients: BloggerCourse.com  Fact Sheet for Healthcare Providers: SeriousBroker.it  This test is not yet approved or cleared by the [place] FDA and has been authorized for detection and/or diagnosis of SARS-CoV-2 by FDA under an Emergency Use Authorization (EUA). This EUA will remain in effect (meaning this test can be used) for the duration of the COVID-19 declaration under Section 564(b)(1) of the Act, 21 U.S.C. section 360bbb-3(b)(1), unless the authorization is terminated or revoked.     Resp Syncytial Virus by PCR NEGATIVE NEGATIVE Final    Comment: (NOTE) Fact Sheet for Patients: BloggerCourse.com  Fact Sheet for Healthcare Providers: SeriousBroker.it  This test is not yet approved or cleared  by the [place] FDA and has been authorized for detection and/or diagnosis of SARS-CoV-2 by FDA under an Emergency Use Authorization (EUA). This EUA will remain in effect (meaning this test can be used) for the duration of the COVID-19 declaration under Section 564(b)(1) of the Act, 21 U.S.C. section 360bbb-3(b)(1), unless the authorization is terminated or revoked.  Performed at [facility], [address] [place], [place] [address]      Radiological Exams on Admission: CT Angio Chest PE W and/or Wo Contrast Result Date: 10/30/2023 CLINICAL DATA:  Pulmonary embolism suspected, high probability. Shortness of breath while lying flat. Chest pain under left breast. Pain with coughing. EXAM: CT ANGIOGRAPHY CHEST WITH CONTRAST TECHNIQUE: Multidetector CT imaging of the chest was performed using the standard protocol during bolus administration of intravenous contrast. Multiplanar CT image reconstructions and MIPs were obtained to evaluate the vascular anatomy. RADIATION DOSE REDUCTION: This exam was performed according to the departmental dose-optimization program which includes automated  exposure control, adjustment of the mA and/or kV according to patient size and/or use of iterative reconstruction technique. CONTRAST:  75mL OMNIPAQUE IOHEXOL 350 MG/ML SOLN COMPARISON:  08/20/2016. FINDINGS: Cardiovascular: The heart is normal in size and there is no pericardial effusion. Scattered coronary artery calcifications are noted. There is atherosclerotic calcification of the aorta without evidence of aneurysm. The pulmonary trunk is normal in caliber. No definite evidence of pulmonary embolism. Examination is limited due to mixing artifact. Mediastinum/Nodes: No mediastinal, hilar, or axillary lymphadenopathy by size criteria. The thyroid gland, trachea, and esophagus are within normal limits. Lungs/Pleura: Mild centrilobular emphysematous changes are noted at the lung apices.  Atelectasis is noted bilaterally. No effusion or pneumothorax is seen. Upper Abdomen: Fatty infiltration of the liver is noted. No acute abnormality. Musculoskeletal: Degenerative changes are noted in the thoracic spine. No acute osseous abnormality is seen. Review of the MIP images confirms the above findings. IMPRESSION: 1. No evidence of pulmonary embolism or other acute process. 2. Emphysema. 3. Hepatic steatosis. 4. Coronary artery calcifications. 5. Aortic atherosclerosis. Electronically Signed   By: Thornell Sartorius M.D.   On: 10/30/2023 20:17   DG Chest 2 View Result Date: 10/30/2023 CLINICAL DATA:  Chest pain. EXAM: CHEST - 2 VIEW COMPARISON:  Chest radiograph dated 08/01/2023. FINDINGS: Mild central vascular congestion. No focal consolidation, pleural effusion, or pneumothorax. The cardiac silhouette is within normal limits. Atherosclerotic calcification of the aorta. No acute osseous pathology. IMPRESSION: Mild central vascular congestion. No focal consolidation. Electronically Signed   By: Elgie Collard M.D.   On: 10/30/2023 13:29    EKG: Independently reviewed.  Sinus tachycardia.  Assessment/Plan Active Problems:   Hypertensive heart disease   Type 2 diabetes mellitus with hyperglycemia (HCC)   Atherosclerosis of aorta (HCC)   Chronic diastolic heart failure (HCC)   Obesity (BMI 30-39.9)   Tobacco use   Acute respiratory failure with hypoxia (HCC)   Influenza A    Acute respiratory failure with hypoxia secondary to influenza A infection will keep patient on Tamiflu due to respiratory failure and also on antibiotics for community-acquired pneumonia.  Closely monitor respiratory status. Sinus tachycardia has been persistently tachycardic.  Likely from respiratory failure.  Check TSH and D-dimer. Chronic diastolic CHF appears compensated.  Last 2D echo done in November 2024 showed EF more than 75% with indeterminant diastolic dysfunction. Hypertension on ARB amlodipine and  metoprolol and hydrochlorothiazide. Elevated troponin but has remained flat.  Likely due to demand ischemia in the setting of influenza.  Will check 2D echo D-dimer.  Cardiac cath in 2018 was unremarkable. Tobacco use with possible COPD continue inhalers.  Advised about quitting.  Not actively wheezing at the time of my exam. Possible chronic kidney disease stage III.  Closely monitor metabolic panel if any further worsening may have to hold hydrochlorothiazide and ARB. Diabetes mellitus type II with hyperglycemia last hemoglobin A1c 3 months ago was 13.  Takes metformin and glimepiride at home.  Presently on sliding scale coverage may add long-acting insulin if persistently hyperglycemic. Neuropathy on gabapentin.  Patient has acute respiratory failure in the setting of influenza will need close monitoring and more than 2 midnight stay.   DVT prophylaxis: Lovenox. Code Status: Full code. Family Communication: Discussed with patient. Disposition Plan: Monitored bed. Consults called: None. Admission status: Observation.

## 2023-10-30 NOTE — ED Provider Notes (Signed)
[place] EMERGENCY DEPARTMENT AT [facility] Provider Note  CSN: [id number] Arrival date & time: 10/30/23 1137  Chief Complaint(s) Chest Pain  HPI Debbie Davis is a 63 y.o. female with PMH T2DM, HTN, obesity, CAD who presents emergency department for evaluation of chest pain cough, shortness of breath and lower extremity pain.  States symptoms been worsening over the last 3 days.  Received 5 of albuterol, 0.5 of Atrovent, 125 Solu-Medrol and 2 g of mag prior to arrival due to wheezing heard by EMS.  Also states that her neuropathy in her lower extremities is causing significant pain.  Patient arrives tachypneic and tachycardic.  Denies associated nausea, vomiting, headache or other systemic symptoms.   Past Medical History Past Medical History:  Diagnosis Date   Atherosclerosis of aorta (HCC) 11/25/2016   Chronic diastolic heart failure (HCC) 11/25/2016   Diabetes type 2, uncontrolled 08/22/2016   Hypertension    Hypertensive heart disease 08/20/2016   Obesity (BMI 30-39.9) 11/25/2016   Patient Active Problem List   Diagnosis Date Noted   Elevated troponin 10/31/2023   CKD (chronic kidney disease) stage 3, GFR 30-59 ml/min (HCC) 10/31/2023   Sinus tachycardia 10/31/2023   Acute respiratory failure with hypoxia (HCC) 10/30/2023   Influenza A 10/30/2023   Chest pain 08/01/2023   Tobacco use 08/01/2023   Coronary atherosclerosis 08/01/2023   Progressive angina (HCC) 11/25/2016   Atherosclerosis of aorta (HCC) 11/25/2016   Chronic diastolic heart failure (HCC) 11/25/2016   Obesity (BMI 30-39.9) 11/25/2016   Dyspnea 11/25/2016   Type 2 diabetes mellitus with hyperglycemia (HCC) 08/22/2016   Hypertensive heart disease 08/20/2016   Home Medication(s) Prior to Admission medications   Medication Sig Start Date End Date Taking? Authorizing Provider  albuterol (PROVENTIL HFA;VENTOLIN HFA) 108 (90 Base) MCG/ACT inhaler Inhale 1-2 puffs into the lungs every 4 (four) hours as  needed for wheezing or shortness of breath. 08/12/16  Yes Rose, Kayla, PA-C  amLODipine-valsartan (EXFORGE) 10-160 MG tablet Take 1 tablet by mouth daily. 05/16/23  Yes [provider]  aspirin 81 MG chewable tablet Chew 81 mg by mouth daily.   Yes [provider]  atorvastatin (LIPITOR) 40 MG tablet Take 40 mg by mouth daily.   Yes [provider]  fluticasone (FLONASE) 50 MCG/ACT nasal spray Place 2 sprays into both nostrils daily as needed for allergies. 05/16/23  Yes [provider]  fluticasone-salmeterol (ADVAIR) 250-50 MCG/ACT AEPB Inhale 1 puff into the lungs 2 (two) times daily. 06/13/23  Yes [provider]  gabapentin (NEURONTIN) 300 MG capsule Take 300 mg by mouth 3 (three) times daily as needed (for pain). 07/15/23  Yes [provider]  metFORMIN (GLUCOPHAGE) 500 MG tablet Take 1 tablet (500 mg total) by mouth 2 (two) times daily with a meal. 07/12/20  Yes Rolan Bucco, MD  metoprolol-hydrochlorothiazide (LOPRESSOR HCT) 100-50 MG tablet Take 1 tablet by mouth daily.   Yes [provider]  montelukast (SINGULAIR) 10 MG tablet Take 10 mg by mouth at bedtime. 05/29/23  Yes [provider]  Multiple Vitamin (MULTIVITAMIN WITH MINERALS) TABS tablet Take 1 tablet by mouth daily with breakfast.   Yes [provider]  TYLENOL 500 MG tablet Take 500-1,000 mg by mouth every 6 (six) hours as needed for mild pain (pain score 1-3) or headache.   Yes [provider]  umeclidinium-vilanterol (ANORO ELLIPTA) 62.5-25 MCG/INH AEPB Inhale 1 puff into the lungs daily as needed (shortness of breath).    Yes [provider]                                                                                                                                    Past Surgical History Past Surgical History:  Procedure Laterality Date   KIDNEY SURGERY Right    removal of kidney stone   RIGHT/LEFT HEART CATH AND CORONARY  ANGIOGRAPHY N/A 11/26/2016   Procedure: Right/Left Heart Cath and Coronary Angiography;  Surgeon: Marykay Lex, MD;  Location: [facility] INVASIVE CV LAB;  Service: Cardiovascular;  Laterality: N/A;   TUBAL LIGATION     Family History Family History  Problem Relation Age of Onset   Cancer Mother    Heart disease Father    Diabetes Sister    Diabetes Brother    Diabetes Sister    Diabetes Sister    Diabetes Sister     Social History Social History   Tobacco Use   Smoking status: Every Day    Current packs/day: 0.50    Average packs/day: 0.5 packs/day for 30.0 years (15.0 ttl pk-yrs)    Types: Cigarettes   Smokeless tobacco: Never  Vaping Use   Vaping status: Never Used  Substance Use Topics   Alcohol use: Yes   Drug use: No   Allergies Morphine and codeine  Review of Systems Review of Systems  Respiratory:  Positive for cough, shortness of breath and wheezing.   Musculoskeletal:  Positive for arthralgias and myalgias.    Physical Exam Vital Signs  I have reviewed the triage vital signs BP 93/63   Pulse 77   Temp 98.7 F (37.1 C) (Oral)   Resp 15   Ht 5' (1.524 m)   Wt 75.3 kg   SpO2 99%   BMI 32.42 kg/m   Physical Exam Vitals and nursing note reviewed.  Constitutional:      General: She is not in acute distress.    Appearance: She is well-developed.  HENT:     Head: Normocephalic and atraumatic.  Eyes:     Conjunctiva/sclera: Conjunctivae normal.  Cardiovascular:     Rate and Rhythm: Normal rate and regular rhythm.     Heart sounds: No murmur heard. Pulmonary:     Effort: Pulmonary effort is normal. No respiratory distress.     Breath sounds: Wheezing present.  Abdominal:     Palpations: Abdomen is soft.     Tenderness: There is no abdominal tenderness.  Musculoskeletal:        General: No swelling.     Cervical back: Neck supple.  Skin:    General: Skin is warm and dry.     Capillary Refill: Capillary refill takes less than 2 seconds.   Neurological:     Mental Status: She is alert.  Psychiatric:        Mood and Affect: Mood normal.     ED Results and Treatments Labs (all labs ordered are listed, but  only abnormal results are displayed) Labs Reviewed  RESP PANEL BY RT-PCR (RSV, FLU A&B, COVID)  RVPGX2 - Abnormal; Notable for the following components:      Result Value   Influenza A by PCR POSITIVE (*)    All other components within normal limits  BASIC METABOLIC PANEL - Abnormal; Notable for the following components:   Potassium 3.3 (*)    Glucose, Bld 203 (*)    Creatinine, Ser 1.23 (*)    GFR, Estimated 50 (*)    All other components within normal limits  CBC - Abnormal; Notable for the following components:   Hemoglobin 11.4 (*)    All other components within normal limits  CREATININE, SERUM - Abnormal; Notable for the following components:   Creatinine, Ser 1.76 (*)    GFR, Estimated 32 (*)    All other components within normal limits  CBC - Abnormal; Notable for the following components:   Hemoglobin 11.0 (*)    HCT 35.8 (*)    All other components within normal limits  TSH - Abnormal; Notable for the following components:   TSH 0.129 (*)    All other components within normal limits  BASIC METABOLIC PANEL - Abnormal; Notable for the following components:   Sodium 132 (*)    Glucose, Bld 503 (*)    BUN 35 (*)    Creatinine, Ser 2.00 (*)    Calcium 8.4 (*)    GFR, Estimated 28 (*)    All other components within normal limits  CBG MONITORING, ED - Abnormal; Notable for the following components:   Glucose-Capillary 404 (*)    All other components within normal limits  CBG MONITORING, ED - Abnormal; Notable for the following components:   Glucose-Capillary 447 (*)    All other components within normal limits  CBG MONITORING, ED - Abnormal; Notable for the following components:   Glucose-Capillary 354 (*)    All other components within normal limits  TROPONIN I (HIGH SENSITIVITY) - Abnormal;  Notable for the following components:   Troponin I (High Sensitivity) 35 (*)    All other components within normal limits  TROPONIN I (HIGH SENSITIVITY) - Abnormal; Notable for the following components:   Troponin I (High Sensitivity) 36 (*)    All other components within normal limits  EXPECTORATED SPUTUM ASSESSMENT W GRAM STAIN, RFLX TO RESP C  CBC  BRAIN NATRIURETIC PEPTIDE  HIV ANTIBODY (ROUTINE TESTING W REFLEX)  D-DIMER, QUANTITATIVE  RAPID URINE DRUG SCREEN, HOSP PERFORMED  BASIC METABOLIC PANEL                                                                                                                          Radiology CT Angio Chest PE W and/or Wo Contrast Result Date: 10/30/2023 CLINICAL DATA:  Pulmonary embolism suspected, high probability. Shortness of breath while lying flat. Chest pain under left breast. Pain with coughing. EXAM: CT ANGIOGRAPHY CHEST WITH CONTRAST TECHNIQUE: Multidetector CT imaging of the chest  was performed using the standard protocol during bolus administration of intravenous contrast. Multiplanar CT image reconstructions and MIPs were obtained to evaluate the vascular anatomy. RADIATION DOSE REDUCTION: This exam was performed according to the departmental dose-optimization program which includes automated exposure control, adjustment of the mA and/or kV according to patient size and/or use of iterative reconstruction technique. CONTRAST:  75mL OMNIPAQUE IOHEXOL 350 MG/ML SOLN COMPARISON:  08/20/2016. FINDINGS: Cardiovascular: The heart is normal in size and there is no pericardial effusion. Scattered coronary artery calcifications are noted. There is atherosclerotic calcification of the aorta without evidence of aneurysm. The pulmonary trunk is normal in caliber. No definite evidence of pulmonary embolism. Examination is limited due to mixing artifact. Mediastinum/Nodes: No mediastinal, hilar, or axillary lymphadenopathy by size criteria. The thyroid gland,  trachea, and esophagus are within normal limits. Lungs/Pleura: Mild centrilobular emphysematous changes are noted at the lung apices. Atelectasis is noted bilaterally. No effusion or pneumothorax is seen. Upper Abdomen: Fatty infiltration of the liver is noted. No acute abnormality. Musculoskeletal: Degenerative changes are noted in the thoracic spine. No acute osseous abnormality is seen. Review of the MIP images confirms the above findings. IMPRESSION: 1. No evidence of pulmonary embolism or other acute process. 2. Emphysema. 3. Hepatic steatosis. 4. Coronary artery calcifications. 5. Aortic atherosclerosis. Electronically Signed   By: Thornell Sartorius M.D.   On: 10/30/2023 20:17   DG Chest 2 View Result Date: 10/30/2023 CLINICAL DATA:  Chest pain. EXAM: CHEST - 2 VIEW COMPARISON:  Chest radiograph dated 08/01/2023. FINDINGS: Mild central vascular congestion. No focal consolidation, pleural effusion, or pneumothorax. The cardiac silhouette is within normal limits. Atherosclerotic calcification of the aorta. No acute osseous pathology. IMPRESSION: Mild central vascular congestion. No focal consolidation. Electronically Signed   By: Elgie Collard M.D.   On: 10/30/2023 13:29    Pertinent labs & imaging results that were available during my care of the patient were reviewed by me and considered in my medical decision making (see MDM for details).  Medications Ordered in ED Medications  aspirin chewable tablet 81 mg (81 mg Oral Given 10/31/23 0909)  atorvastatin (LIPITOR) tablet 40 mg (40 mg Oral Given 10/31/23 0909)  gabapentin (NEURONTIN) capsule 300 mg (has no administration in time range)  multivitamin with minerals tablet 1 tablet (1 tablet Oral Given 10/31/23 0745)  mometasone-formoterol (DULERA) 200-5 MCG/ACT inhaler 2 puff (2 puffs Inhalation Not Given 10/31/23 1116)  montelukast (SINGULAIR) tablet 10 mg (10 mg Oral Given 10/31/23 0200)  acetaminophen (TYLENOL) tablet 650 mg (has no administration in  time range)    Or  acetaminophen (TYLENOL) suppository 650 mg (has no administration in time range)  cefTRIAXone (ROCEPHIN) 2 g in sodium chloride 0.9 % 100 mL IVPB (0 g Intravenous Stopped 10/31/23 0132)  azithromycin (ZITHROMAX) tablet 500 mg (500 mg Oral Given 10/31/23 0103)  oseltamivir (TAMIFLU) capsule 30 mg (30 mg Oral Given 10/31/23 1127)  amLODipine (NORVASC) tablet 10 mg (10 mg Oral Given 10/31/23 0909)  metoprolol tartrate (LOPRESSOR) tablet 100 mg (100 mg Oral Given 10/31/23 0909)  insulin aspart (novoLOG) injection 3 Units (3 Units Subcutaneous Given 10/31/23 1126)  insulin aspart (novoLOG) injection 0-9 Units (9 Units Subcutaneous Given 10/31/23 1126)  insulin glargine-yfgn (SEMGLEE) injection 8 Units (8 Units Subcutaneous Given 10/31/23 0803)  0.9 %  sodium chloride infusion ( Intravenous New Bag/Given 10/31/23 0957)  enoxaparin (LOVENOX) injection 30 mg (30 mg Subcutaneous Given 10/31/23 1125)  acetaminophen (TYLENOL) tablet 650 mg (650 mg Oral Given 10/30/23 1235)  ipratropium-albuterol (  DUONEB) 0.5-2.5 (3) MG/3ML nebulizer solution 9 mL (9 mLs Nebulization Given 10/30/23 1738)  oxyCODONE (Oxy IR/ROXICODONE) immediate release tablet 5 mg (5 mg Oral Given 10/30/23 1747)  gabapentin (NEURONTIN) capsule 600 mg (600 mg Oral Given 10/30/23 1933)  iohexol (OMNIPAQUE) 350 MG/ML injection 75 mL (75 mLs Intravenous Contrast Given 10/30/23 1917)  oseltamivir (TAMIFLU) capsule 75 mg (75 mg Oral Given 10/31/23 0104)  potassium chloride (KLOR-CON) packet 20 mEq (20 mEq Oral Given 10/31/23 0545)  insulin aspart (novoLOG) injection 18 Units (18 Units Subcutaneous Given 10/31/23 0948)                                                                                                                                     Procedures .Critical Care  Performed by: Glendora Score, MD Authorized by: Glendora Score, MD   Critical care provider statement:    Critical care time (minutes):  30   Critical care was  necessary to treat or prevent imminent or life-threatening deterioration of the following conditions:  Respiratory failure   Critical care was time spent personally by me on the following activities:  Development of treatment plan with patient or surrogate, discussions with consultants, evaluation of patient's response to treatment, examination of patient, ordering and review of laboratory studies, ordering and review of radiographic studies, ordering and performing treatments and interventions, pulse oximetry, re-evaluation of patient's condition and review of old charts   (including critical care time)  Medical Decision Making / ED Course   This patient presents to the ED for concern of shortness of breath, chest pain, this involves an extensive number of treatment options, and is a complaint that carries with it a high risk of complications and morbidity.  The differential diagnosis includes Pe, PTX, Pulmonary Edema, ARDS, COPD/Asthma, ACS, CHF exacerbation, Arrhythmia, Pericardial Effusion/Tamponade, Anemia, Sepsis, Acidosis/Hypercapnia, Anxiety, Viral URI  MDM: Seen emergency room for evaluation of shortness of breath and chest pain.  Physical exam with tachypnea and rapid tachycardia, diffuse wheezing bilaterally.  Patient received 3 DuoNebs with mild improvement of the wheezing but shortness of breath is persistent.  Laboratory evaluation with creatinine 1.23 but is otherwise unremarkable.  High-sensitivity troponin 35 but delta is flat at 36.  BNP is normal.  Patient is influenza positive which may have triggered her underlying reactive airway disease.  Chest x-ray with mild vascular congestion but is otherwise unremarkable.  On my reevaluation, patient is continuing to have persistent centralized chest pain with tachycardia and thus a PE study was obtained that was reassuringly negative for PE or acute pathology in the chest.  Patient has a new 2 L oxygen requirement and will require hospital  admission for persistent tachycardia, persistent chest pain in the setting of influenza.  Patient admitted   Additional history obtained:  -External records from outside source obtained and reviewed including: Chart review including previous notes, labs, imaging, consultation notes   Lab Tests: -I ordered,  reviewed, and interpreted labs.   The pertinent results include:   Labs Reviewed  RESP PANEL BY RT-PCR (RSV, FLU A&B, COVID)  RVPGX2 - Abnormal; Notable for the following components:      Result Value   Influenza A by PCR POSITIVE (*)    All other components within normal limits  BASIC METABOLIC PANEL - Abnormal; Notable for the following components:   Potassium 3.3 (*)    Glucose, Bld 203 (*)    Creatinine, Ser 1.23 (*)    GFR, Estimated 50 (*)    All other components within normal limits  CBC - Abnormal; Notable for the following components:   Hemoglobin 11.4 (*)    All other components within normal limits  CREATININE, SERUM - Abnormal; Notable for the following components:   Creatinine, Ser 1.76 (*)    GFR, Estimated 32 (*)    All other components within normal limits  CBC - Abnormal; Notable for the following components:   Hemoglobin 11.0 (*)    HCT 35.8 (*)    All other components within normal limits  TSH - Abnormal; Notable for the following components:   TSH 0.129 (*)    All other components within normal limits  BASIC METABOLIC PANEL - Abnormal; Notable for the following components:   Sodium 132 (*)    Glucose, Bld 503 (*)    BUN 35 (*)    Creatinine, Ser 2.00 (*)    Calcium 8.4 (*)    GFR, Estimated 28 (*)    All other components within normal limits  CBG MONITORING, ED - Abnormal; Notable for the following components:   Glucose-Capillary 404 (*)    All other components within normal limits  CBG MONITORING, ED - Abnormal; Notable for the following components:   Glucose-Capillary 447 (*)    All other components within normal limits  CBG MONITORING, ED -  Abnormal; Notable for the following components:   Glucose-Capillary 354 (*)    All other components within normal limits  TROPONIN I (HIGH SENSITIVITY) - Abnormal; Notable for the following components:   Troponin I (High Sensitivity) 35 (*)    All other components within normal limits  TROPONIN I (HIGH SENSITIVITY) - Abnormal; Notable for the following components:   Troponin I (High Sensitivity) 36 (*)    All other components within normal limits  EXPECTORATED SPUTUM ASSESSMENT W GRAM STAIN, RFLX TO RESP C  CBC  BRAIN NATRIURETIC PEPTIDE  HIV ANTIBODY (ROUTINE TESTING W REFLEX)  D-DIMER, QUANTITATIVE  RAPID URINE DRUG SCREEN, HOSP PERFORMED  BASIC METABOLIC PANEL      EKG   EKG Interpretation Date/Time:  Thursday October 30 2023 11:48:55 EST Ventricular Rate:  123 PR Interval:  139 QRS Duration:  85 QT Interval:  310 QTC Calculation: 444 R Axis:   20  Text Interpretation: Sinus tachycardia Probable anteroseptal infarct, recent Confirmed by [place] Luppino ([id number]) on 10/31/2023 11:35:31 AM         Imaging Studies ordered: I ordered imaging studies including chest x-ray, CT PE I independently visualized and interpreted imaging. I agree with the radiologist interpretation   Medicines ordered and prescription drug management: Meds ordered this encounter  Medications   acetaminophen (TYLENOL) tablet 650 mg   ipratropium-albuterol (DUONEB) 0.5-2.5 (3) MG/3ML nebulizer solution 9 mL   DISCONTD: acetaminophen (TYLENOL) tablet 1,000 mg   oxyCODONE (Oxy IR/ROXICODONE) immediate release tablet 5 mg    Refill:  0   gabapentin (NEURONTIN) capsule 600 mg   iohexol (OMNIPAQUE) 350 MG/ML  injection 75 mL   aspirin chewable tablet 81 mg   DISCONTD: amLODipine-valsartan (EXFORGE) 10-160 MG per tablet 1 tablet   atorvastatin (LIPITOR) tablet 40 mg   DISCONTD: metoprolol-hydrochlorothiazide (LOPRESSOR HCT) 100-50 MG per tablet 1 tablet   gabapentin (NEURONTIN) capsule 300 mg    multivitamin with minerals tablet 1 tablet   mometasone-formoterol (DULERA) 200-5 MCG/ACT inhaler 2 puff   montelukast (SINGULAIR) tablet 10 mg   DISCONTD: enoxaparin (LOVENOX) injection 40 mg   OR Linked Order Group    acetaminophen (TYLENOL) tablet 650 mg    acetaminophen (TYLENOL) suppository 650 mg   oseltamivir (TAMIFLU) capsule 75 mg   cefTRIAXone (ROCEPHIN) 2 g in sodium chloride 0.9 % 100 mL IVPB    Antibiotic Indication::   CAP   azithromycin (ZITHROMAX) tablet 500 mg   oseltamivir (TAMIFLU) capsule 30 mg   amLODipine (NORVASC) tablet 10 mg   DISCONTD: irbesartan (AVAPRO) tablet 150 mg   metoprolol tartrate (LOPRESSOR) tablet 100 mg   DISCONTD: hydrochlorothiazide (HYDRODIURIL) tablet 50 mg   potassium chloride (KLOR-CON) packet 20 mEq   insulin aspart (novoLOG) injection 3 Units   insulin aspart (novoLOG) injection 0-9 Units    Correction coverage::   Sensitive (thin, NPO, renal)    CBG < 70::   Implement Hypoglycemia Standing Orders and refer to Hypoglycemia Standing Orders sidebar report    CBG 70 - 120::   0 units    CBG 121 - 150::   1 unit    CBG 151 - 200::   2 units    CBG 201 - 250::   3 units    CBG 251 - 300::   5 units    CBG 301 - 350::   7 units    CBG 351 - 400:   9 units    CBG > 400:   call MD and obtain STAT lab verification   insulin glargine-yfgn (SEMGLEE) injection 8 Units   0.9 %  sodium chloride infusion   insulin aspart (novoLOG) injection 18 Units   enoxaparin (LOVENOX) injection 30 mg    -I have reviewed the patients home medicines and have made adjustments as needed  Critical interventions Multiple DuoNebs, supplemental oxygen    Cardiac Monitoring: The patient was maintained on a cardiac monitor.  I personally viewed and interpreted the cardiac monitored which showed an underlying rhythm of: Sinus tachycardia  Social Determinants of Health:  Factors impacting patients care include: none   Reevaluation: After the interventions noted  above, I reevaluated the patient and found that they have :improved  Co morbidities that complicate the patient evaluation  Past Medical History:  Diagnosis Date   Atherosclerosis of aorta (HCC) 11/25/2016   Chronic diastolic heart failure (HCC) 11/25/2016   Diabetes type 2, uncontrolled 08/22/2016   Hypertension    Hypertensive heart disease 08/20/2016   Obesity (BMI 30-39.9) 11/25/2016      Dispostion: I considered admission for this patient, and patient require hospital admission for new oxygen requirement and persistent chest pain in the setting of influenza     Final Clinical Impression(s) / ED Diagnoses Final diagnoses:  Chest pain, unspecified type  Influenza A     @PCDICTATION @    Glendora Score, MD 10/31/23 1136

## 2023-10-30 NOTE — ED Notes (Signed)
HR 126, O2 93% RA during ambulation trial, MD made aware.

## 2023-10-31 ENCOUNTER — Observation Stay (HOSPITAL_COMMUNITY): Payer: Medicare HMO

## 2023-10-31 DIAGNOSIS — R079 Chest pain, unspecified: Secondary | ICD-10-CM | POA: Diagnosis not present

## 2023-10-31 DIAGNOSIS — R000 Tachycardia, unspecified: Secondary | ICD-10-CM | POA: Insufficient documentation

## 2023-10-31 DIAGNOSIS — J9601 Acute respiratory failure with hypoxia: Secondary | ICD-10-CM | POA: Diagnosis present

## 2023-10-31 DIAGNOSIS — Z7982 Long term (current) use of aspirin: Secondary | ICD-10-CM | POA: Diagnosis not present

## 2023-10-31 DIAGNOSIS — I130 Hypertensive heart and chronic kidney disease with heart failure and stage 1 through stage 4 chronic kidney disease, or unspecified chronic kidney disease: Secondary | ICD-10-CM | POA: Diagnosis present

## 2023-10-31 DIAGNOSIS — J45909 Unspecified asthma, uncomplicated: Secondary | ICD-10-CM | POA: Diagnosis present

## 2023-10-31 DIAGNOSIS — E1142 Type 2 diabetes mellitus with diabetic polyneuropathy: Secondary | ICD-10-CM | POA: Diagnosis present

## 2023-10-31 DIAGNOSIS — R7989 Other specified abnormal findings of blood chemistry: Secondary | ICD-10-CM | POA: Insufficient documentation

## 2023-10-31 DIAGNOSIS — Z7951 Long term (current) use of inhaled steroids: Secondary | ICD-10-CM | POA: Diagnosis not present

## 2023-10-31 DIAGNOSIS — E1165 Type 2 diabetes mellitus with hyperglycemia: Secondary | ICD-10-CM | POA: Diagnosis present

## 2023-10-31 DIAGNOSIS — N183 Chronic kidney disease, stage 3 unspecified: Secondary | ICD-10-CM | POA: Insufficient documentation

## 2023-10-31 DIAGNOSIS — Z1152 Encounter for screening for COVID-19: Secondary | ICD-10-CM | POA: Diagnosis not present

## 2023-10-31 DIAGNOSIS — J189 Pneumonia, unspecified organism: Secondary | ICD-10-CM | POA: Diagnosis present

## 2023-10-31 DIAGNOSIS — I700 Atherosclerosis of aorta: Secondary | ICD-10-CM | POA: Diagnosis present

## 2023-10-31 DIAGNOSIS — I2510 Atherosclerotic heart disease of native coronary artery without angina pectoris: Secondary | ICD-10-CM | POA: Diagnosis present

## 2023-10-31 DIAGNOSIS — E1122 Type 2 diabetes mellitus with diabetic chronic kidney disease: Secondary | ICD-10-CM | POA: Diagnosis present

## 2023-10-31 DIAGNOSIS — I272 Pulmonary hypertension, unspecified: Secondary | ICD-10-CM | POA: Diagnosis present

## 2023-10-31 DIAGNOSIS — F17210 Nicotine dependence, cigarettes, uncomplicated: Secondary | ICD-10-CM | POA: Diagnosis present

## 2023-10-31 DIAGNOSIS — I5032 Chronic diastolic (congestive) heart failure: Secondary | ICD-10-CM | POA: Diagnosis present

## 2023-10-31 DIAGNOSIS — N179 Acute kidney failure, unspecified: Secondary | ICD-10-CM | POA: Diagnosis present

## 2023-10-31 DIAGNOSIS — E785 Hyperlipidemia, unspecified: Secondary | ICD-10-CM | POA: Diagnosis present

## 2023-10-31 DIAGNOSIS — J101 Influenza due to other identified influenza virus with other respiratory manifestations: Secondary | ICD-10-CM | POA: Diagnosis present

## 2023-10-31 DIAGNOSIS — E1151 Type 2 diabetes mellitus with diabetic peripheral angiopathy without gangrene: Secondary | ICD-10-CM | POA: Diagnosis present

## 2023-10-31 DIAGNOSIS — J1000 Influenza due to other identified influenza virus with unspecified type of pneumonia: Secondary | ICD-10-CM | POA: Diagnosis present

## 2023-10-31 DIAGNOSIS — N182 Chronic kidney disease, stage 2 (mild): Secondary | ICD-10-CM | POA: Diagnosis present

## 2023-10-31 DIAGNOSIS — E669 Obesity, unspecified: Secondary | ICD-10-CM | POA: Diagnosis present

## 2023-10-31 DIAGNOSIS — Z7984 Long term (current) use of oral hypoglycemic drugs: Secondary | ICD-10-CM | POA: Diagnosis not present

## 2023-10-31 DIAGNOSIS — Z6832 Body mass index (BMI) 32.0-32.9, adult: Secondary | ICD-10-CM | POA: Diagnosis not present

## 2023-10-31 DIAGNOSIS — I2489 Other forms of acute ischemic heart disease: Secondary | ICD-10-CM | POA: Diagnosis present

## 2023-10-31 LAB — RAPID URINE DRUG SCREEN, HOSP PERFORMED
Amphetamines: NOT DETECTED
Barbiturates: NOT DETECTED
Benzodiazepines: NOT DETECTED
Cocaine: NOT DETECTED
Opiates: NOT DETECTED
Tetrahydrocannabinol: NOT DETECTED

## 2023-10-31 LAB — BASIC METABOLIC PANEL
Anion gap: 8 (ref 5–15)
Anion gap: 14 (ref 5–15)
BUN: 35 mg/dL — ABNORMAL HIGH (ref 8–23)
BUN: 39 mg/dL — ABNORMAL HIGH (ref 8–23)
CO2: 24 mmol/L (ref 22–32)
CO2: 23 mmol/L (ref 22–32)
Calcium: 8.4 mg/dL — ABNORMAL LOW (ref 8.9–10.3)
Calcium: 9 mg/dL (ref 8.9–10.3)
Chloride: 100 mmol/L (ref 98–111)
Chloride: 101 mmol/L (ref 98–111)
Creatinine, Ser: 2 mg/dL — ABNORMAL HIGH (ref 0.44–1.00)
Creatinine, Ser: 2.09 mg/dL — ABNORMAL HIGH (ref 0.44–1.00)
GFR, Estimated: 28 mL/min — ABNORMAL LOW (ref >60)
GFR, Estimated: 26 mL/min — ABNORMAL LOW (ref >60)
Glucose, Bld: 503 mg/dL (ref 70–99)
Glucose, Bld: 349 mg/dL — ABNORMAL HIGH (ref 70–99)
Potassium: 4.7 mmol/L (ref 3.5–5.1)
Potassium: 4.7 mmol/L (ref 3.5–5.1)
Sodium: 132 mmol/L — ABNORMAL LOW (ref 135–145)
Sodium: 138 mmol/L (ref 135–145)

## 2023-10-31 LAB — CBG MONITORING, ED
Glucose-Capillary: 404 mg/dL — ABNORMAL HIGH (ref 70–99)
Glucose-Capillary: 447 mg/dL — ABNORMAL HIGH (ref 70–99)
Glucose-Capillary: 354 mg/dL — ABNORMAL HIGH (ref 70–99)

## 2023-10-31 LAB — CBC
HCT: 37.1 % (ref 36.0–46.0)
HCT: 35.8 % — ABNORMAL LOW (ref 36.0–46.0)
Hemoglobin: 11.4 g/dL — ABNORMAL LOW (ref 12.0–15.0)
Hemoglobin: 11 g/dL — ABNORMAL LOW (ref 12.0–15.0)
MCH: 26.8 pg (ref 26.0–34.0)
MCH: 26.8 pg (ref 26.0–34.0)
MCHC: 30.7 g/dL (ref 30.0–36.0)
MCHC: 30.7 g/dL (ref 30.0–36.0)
MCV: 87.1 fL (ref 80.0–100.0)
MCV: 87.1 fL (ref 80.0–100.0)
Platelets: 173 10*3/uL (ref 150–400)
Platelets: 173 10*3/uL (ref 150–400)
RBC: 4.26 MIL/uL (ref 3.87–5.11)
RBC: 4.11 MIL/uL (ref 3.87–5.11)
RDW: 15.1 % (ref 11.5–15.5)
RDW: 15.1 % (ref 11.5–15.5)
WBC: 5.2 10*3/uL (ref 4.0–10.5)
WBC: 5.2 10*3/uL (ref 4.0–10.5)
nRBC: 0 % (ref 0.0–0.2)
nRBC: 0 % (ref 0.0–0.2)

## 2023-10-31 LAB — ECHOCARDIOGRAM LIMITED
Area-P 1/2: 3.8 cm2
Calc EF: 66.2 %
Height: 60 in
S' Lateral: 1.83 cm
Single Plane A2C EF: 68.2 %
Single Plane A4C EF: 66.4 %
Weight: 2656 [oz_av]

## 2023-10-31 LAB — CREATININE, SERUM
Creatinine, Ser: 1.76 mg/dL — ABNORMAL HIGH (ref 0.44–1.00)
GFR, Estimated: 32 mL/min — ABNORMAL LOW (ref >60)

## 2023-10-31 LAB — TSH: TSH: 0.129 u[IU]/mL — ABNORMAL LOW (ref 0.350–4.500)

## 2023-10-31 LAB — HIV ANTIBODY (ROUTINE TESTING W REFLEX): HIV Screen 4th Generation wRfx: NONREACTIVE

## 2023-10-31 LAB — GLUCOSE, CAPILLARY
Glucose-Capillary: 73 mg/dL (ref 70–99)
Glucose-Capillary: 121 mg/dL — ABNORMAL HIGH (ref 70–99)

## 2023-10-31 LAB — D-DIMER, QUANTITATIVE: D-Dimer, Quant: 0.29 ug{FEU}/mL (ref 0.00–0.50)

## 2023-10-31 MED ORDER — INSULIN ASPART 100 UNIT/ML IJ SOLN
3.0000 [IU] | Freq: Three times a day (TID) | INTRAMUSCULAR | Status: DC
  Administered 2023-10-31 – 2023-11-02 (×7): 3 [IU] via SUBCUTANEOUS
  Filled 2023-10-31: qty 0.03

## 2023-10-31 MED ORDER — INSULIN GLARGINE-YFGN 100 UNIT/ML ~~LOC~~ SOLN
8.0000 [IU] | Freq: Every day | SUBCUTANEOUS | Status: DC
  Administered 2023-10-31 – 2023-11-01 (×2): 8 [IU] via SUBCUTANEOUS
  Filled 2023-10-31 (×3): qty 0.08

## 2023-10-31 MED ORDER — INSULIN ASPART 100 UNIT/ML IJ SOLN
18.0000 [IU] | Freq: Once | INTRAMUSCULAR | Status: AC
  Administered 2023-10-31: 18 [IU] via SUBCUTANEOUS
  Filled 2023-10-31: qty 0.18

## 2023-10-31 MED ORDER — OSELTAMIVIR PHOSPHATE 30 MG PO CAPS
30.0000 mg | ORAL_CAPSULE | Freq: Two times a day (BID) | ORAL | Status: DC
  Administered 2023-10-31 – 2023-11-02 (×5): 30 mg via ORAL
  Filled 2023-10-31 (×6): qty 1

## 2023-10-31 MED ORDER — METOPROLOL TARTRATE 50 MG PO TABS
100.0000 mg | ORAL_TABLET | Freq: Every day | ORAL | Status: DC
  Administered 2023-10-31 – 2023-11-02 (×3): 100 mg via ORAL
  Filled 2023-10-31: qty 4
  Filled 2023-10-31 (×2): qty 2

## 2023-10-31 MED ORDER — POTASSIUM CHLORIDE 20 MEQ PO PACK
20.0000 meq | PACK | Freq: Once | ORAL | Status: AC
  Administered 2023-10-31: 20 meq via ORAL
  Filled 2023-10-31: qty 1

## 2023-10-31 MED ORDER — AMLODIPINE BESYLATE 5 MG PO TABS
10.0000 mg | ORAL_TABLET | Freq: Every day | ORAL | Status: DC
  Administered 2023-10-31 – 2023-11-02 (×3): 10 mg via ORAL
  Filled 2023-10-31 (×3): qty 2

## 2023-10-31 MED ORDER — IRBESARTAN 150 MG PO TABS
150.0000 mg | ORAL_TABLET | Freq: Every day | ORAL | Status: DC
  Filled 2023-10-31: qty 1

## 2023-10-31 MED ORDER — INSULIN ASPART 100 UNIT/ML IJ SOLN
0.0000 [IU] | Freq: Three times a day (TID) | INTRAMUSCULAR | Status: DC
  Administered 2023-10-31: 9 [IU] via SUBCUTANEOUS
  Administered 2023-11-01: 3 [IU] via SUBCUTANEOUS
  Administered 2023-11-01 – 2023-11-02 (×4): 2 [IU] via SUBCUTANEOUS
  Filled 2023-10-31: qty 0.09

## 2023-10-31 MED ORDER — ENOXAPARIN SODIUM 30 MG/0.3ML IJ SOSY
30.0000 mg | PREFILLED_SYRINGE | INTRAMUSCULAR | Status: DC
  Administered 2023-10-31 – 2023-11-01 (×2): 30 mg via SUBCUTANEOUS
  Filled 2023-10-31 (×2): qty 0.3

## 2023-10-31 MED ORDER — SODIUM CHLORIDE 0.9 % IV SOLN: INTRAVENOUS | Status: DC

## 2023-10-31 MED ORDER — HYDROCHLOROTHIAZIDE 12.5 MG PO TABS: 50.0000 mg | ORAL_TABLET | Freq: Every day | ORAL | Status: DC

## 2023-10-31 NOTE — Progress Notes (Signed)
  Echocardiogram 2D Echocardiogram has been performed.  Janalyn Harder 10/31/2023, 3:19 PM

## 2023-10-31 NOTE — Progress Notes (Signed)
[facility]ING   NOTE Debbie Davis HQI:[id number]  DOB: 1960/12/20  DOA: 10/30/2023  PCP: Patient, No Pcp Per  10/31/2023,7:29 AM   LOS: 0 days      Code Status: Full code From: Home  current Dispo: Home   63 year old black female HTN HLD chronic tobacco abuse complicated by intermittent claudication follows with vascular HFpEF-cardiac cath 2018 mild pulmonary hypertension EF 65% DM TY 2  Events 1/30  Came to emergency room SOB DOE left chest pain + headache--influenza positive Tmax 100  troponin slightly elevated creatinine 1.2 BNP 45 potassium 3.3 WBC 8.5 hemoglobin 12.6 platelet 197 TSH 0.12  procedures CXR 2 view mild vascular congestion no focal consolidation CT angio chest shows no pulmonary embolism steatosis   Plan  Acute respiratory failure secondary to influenza A, possible bacterial pneumonia Continue at this time Tamiflu 30 twice daily, azithromycin 500 X5 days and ceftriaxone Narrow antibiotics probably in the next 24 hours We will need to do a desat screen when she reaches the floor-she does smoke so there may be difficulty weaning her off oxygen HFpEF last EF 75% 08/2023 No signs symptoms of worsening heart failure and fluid status seems stable Not on diuretics at home so just monitor at this time Meds adjusted as per below AKI on admission superimposed on CKD 2 BUN/creatinine bumped this is in the setting of patient taking combination medications with Valsartan as well as HCTZ which have been stopped  We will start on fluids saline at 40 cc/H and monitor Will only controlled with amlodipine 10 and metoprolol 100 and may need to hold for short period of time her diuretic and ARB component Elevated troponin Had some chest pain but I feel this is secondary to pleurisy-no further workup at this time as trend is flat Uncontrolled DM TY 2 with diabetic polyneuropathy CBGs ranging up to 500, placed on sliding scale, because of elevated creatinine holding metformin 500  twice daily and may need to reimplement as an outpatient Tobacco abuse Usually smokes less than 6 cigarettes a day not having cravings hold nicotine patch Previous mediastinal lymphadenopathy Does require outpatient follow-up with imaging etc.  DVT prophylaxis: Lovenox  Status is: Observation The patient will require care spanning > 2 midnights and should be moved to inpatient because:   Still sick      Subjective: Looks fair-cough causing abd pain, No sputum--has parozysms of cough No sputum No fever---feels a little better  Objective + exam Vitals:   10/31/23 0300 10/31/23 0400 10/31/23 0500 10/31/23 0600  BP: 118/74 107/67 112/76 113/76  Pulse: 92 85 87 89  Resp:  17 16   Temp:   98.1 F (36.7 C)   TempSrc:   Oral   SpO2: 99% 97% 98% 99%  Weight:      Height:       Filed Weights   10/30/23 1151  Weight: 75.3 kg    Examination: Awake coherent alert some cough and resp distress No wheeze no rales rhonchi Abd soft nt nd no rebound No LE edema  Power 5/5 grossly Psych euthymic  Data Reviewed: reviewed   CBC    Component Value Date/Time   WBC 5.2 10/31/2023 0540   RBC 4.11 10/31/2023 0540   HGB 11.0 (L) 10/31/2023 0540   HCT 35.8 (L) 10/31/2023 0540   PLT 173 10/31/2023 0540   MCV 87.1 10/31/2023 0540   MCH 26.8 10/31/2023 0540   MCHC 30.7 10/31/2023 0540   RDW 15.1 10/31/2023 0540  LYMPHSABS 2.1 08/01/2023 0329   MONOABS 0.6 08/01/2023 0329   EOSABS 0.1 08/01/2023 0329   BASOSABS 0.1 08/01/2023 0329      Latest Ref Rng & Units 10/31/2023    6:46 AM 10/31/2023    1:05 AM 10/30/2023   11:49 AM  CMP  Glucose 70 - 99 mg/dL 562   130   BUN 8 - 23 mg/dL 35   20   Creatinine 8.65 - 1.00 mg/dL 7.84  6.96  2.95   Sodium 135 - 145 mmol/L 132   140   Potassium 3.5 - 5.1 mmol/L 4.7   3.3   Chloride 98 - 111 mmol/L 100   104   CO2 22 - 32 mmol/L 24   23   Calcium 8.9 - 10.3 mg/dL 8.4   9.1     Scheduled Meds:  amLODipine  10 mg Oral Daily   aspirin   81 mg Oral Daily   atorvastatin  40 mg Oral Daily   azithromycin  500 mg Oral QHS   enoxaparin (LOVENOX) injection  40 mg Subcutaneous Q24H   insulin aspart  0-9 Units Subcutaneous TID WC   insulin aspart  3 Units Subcutaneous TID WC   insulin glargine-yfgn  8 Units Subcutaneous QHS   metoprolol tartrate  100 mg Oral Daily   mometasone-formoterol  2 puff Inhalation BID   montelukast  10 mg Oral QHS   multivitamin with minerals  1 tablet Oral Q breakfast   oseltamivir  30 mg Oral BID   Continuous Infusions:  cefTRIAXone (ROCEPHIN)  IV Stopped (10/31/23 0132)    Time  34  Rhetta Mura, MD  [facility]

## 2023-11-01 DIAGNOSIS — R079 Chest pain, unspecified: Secondary | ICD-10-CM | POA: Diagnosis not present

## 2023-11-01 LAB — CBC WITH DIFFERENTIAL/PLATELET
Abs Immature Granulocytes: 0.01 10*3/uL (ref 0.00–0.07)
Basophils Absolute: 0 10*3/uL (ref 0.0–0.1)
Basophils Relative: 1 %
Eosinophils Absolute: 0 10*3/uL (ref 0.0–0.5)
Eosinophils Relative: 1 %
HCT: 36.8 % (ref 36.0–46.0)
Hemoglobin: 11.3 g/dL — ABNORMAL LOW (ref 12.0–15.0)
Immature Granulocytes: 0 %
Lymphocytes Relative: 24 %
Lymphs Abs: 1.1 10*3/uL (ref 0.7–4.0)
MCH: 27.6 pg (ref 26.0–34.0)
MCHC: 30.7 g/dL (ref 30.0–36.0)
MCV: 90 fL (ref 80.0–100.0)
Monocytes Absolute: 0.6 10*3/uL (ref 0.1–1.0)
Monocytes Relative: 13 %
Neutro Abs: 2.7 10*3/uL (ref 1.7–7.7)
Neutrophils Relative %: 61 %
Platelets: 177 10*3/uL (ref 150–400)
RBC: 4.09 MIL/uL (ref 3.87–5.11)
RDW: 15.1 % (ref 11.5–15.5)
WBC: 4.4 10*3/uL (ref 4.0–10.5)
nRBC: 0 % (ref 0.0–0.2)

## 2023-11-01 LAB — GLUCOSE, CAPILLARY
Glucose-Capillary: 130 mg/dL — ABNORMAL HIGH (ref 70–99)
Glucose-Capillary: 180 mg/dL — ABNORMAL HIGH (ref 70–99)
Glucose-Capillary: 210 mg/dL — ABNORMAL HIGH (ref 70–99)
Glucose-Capillary: 118 mg/dL — ABNORMAL HIGH (ref 70–99)

## 2023-11-01 LAB — BASIC METABOLIC PANEL
Anion gap: 10 (ref 5–15)
BUN: 41 mg/dL — ABNORMAL HIGH (ref 8–23)
CO2: 22 mmol/L (ref 22–32)
Calcium: 8.4 mg/dL — ABNORMAL LOW (ref 8.9–10.3)
Chloride: 105 mmol/L (ref 98–111)
Creatinine, Ser: 1.66 mg/dL — ABNORMAL HIGH (ref 0.44–1.00)
GFR, Estimated: 35 mL/min — ABNORMAL LOW (ref >60)
Glucose, Bld: 116 mg/dL — ABNORMAL HIGH (ref 70–99)
Potassium: 3.6 mmol/L (ref 3.5–5.1)
Sodium: 137 mmol/L (ref 135–145)

## 2023-11-01 MED ORDER — ENOXAPARIN SODIUM 40 MG/0.4ML IJ SOSY
40.0000 mg | PREFILLED_SYRINGE | INTRAMUSCULAR | Status: DC
  Administered 2023-11-02: 40 mg via SUBCUTANEOUS
  Filled 2023-11-01: qty 0.4

## 2023-11-01 NOTE — Progress Notes (Signed)
[facility]   NOTE PAETYN PIETRZAK ION:[id number]  DOB: 23-Aug-1961  DOA: 10/30/2023  PCP: Patient, No Pcp Per  11/01/2023,10:04 AM   LOS: 1 day      Code Status: Full code From: Home  current Dispo: Home   63 year old black female HTN HLD chronic tobacco abuse complicated by intermittent claudication follows with vascular HFpEF-cardiac cath 2018 mild pulmonary hypertension EF 65% DM TY 2  Events 1/30  Came to emergency room SOB DOE left chest pain + headache--influenza positive Tmax 100  troponin slightly elevated creatinine 1.2 BNP 45 potassium 3.3 WBC 8.5 hemoglobin 12.6 platelet 197 TSH 0.12  procedures CXR 2 view mild vascular congestion no focal consolidation CT angio chest shows no pulmonary embolism steatosis   Plan  Acute respiratory failure secondary to influenza A, possible bacterial pneumonia Continue at this time Tamiflu 30 twice daily Broad-spectrum antibiotics stopped Set screen needed later today she is improving HFpEF last EF 75% 08/2023 No signs symptoms of worsening heart failure and fluid status seems stable Not on diuretics at home so just monitor at this time Meds adjusted as per below AKI on admission superimposed on CKD 2 BUN/creatinine bumped at admission likely secondary to sepsis in addition to thiazide and ARB which has been discontinued Now improved therefore stop IV fluid DC only on amlodipine 10 and metoprolol 100 and may need to hold for short period of time her diuretic and ARB component Elevated troponin Had some chest pain but I feel this is secondary to pleurisy-no further workup at this time as trend is flat Uncontrolled DM TY 2 with diabetic polyneuropathy CBGs ranging up to 500 at admission but much improved-CBG  70-1 30, continue 8 units long-acting insulin sliding scale sensitive and 3 units 3 times daily meals For neuropathy can continue gabapentin 300 3 times daily Tobacco abuse Usually smokes less than 6 cigarettes a day not having  cravings hold nicotine patch Previous mediastinal lymphadenopathy Does not seem to be present on CT scans this time around Outpatient follow-up  DVT prophylaxis: Lovenox  Status is: Observation The patient will require care spanning > 2 midnights and should be moved to inpatient because:   Improved but requires further management in hospital   Subjective: Overall improved no distress no pain no fever Less cough Looks overall better Some neuropathic pain  Objective + exam Vitals:   11/01/23 0341 11/01/23 0848 11/01/23 0855 11/01/23 0917  BP: 120/82 138/82    Pulse: 94 95    Resp: 20     Temp: 98 F (36.7 C)     TempSrc: Oral     SpO2: 99%  99% 100%  Weight:      Height:       Filed Weights   10/30/23 1151 10/31/23 1532  Weight: 75.3 kg 75.8 kg    Examination: Pleasant coherent awake alert no distress EOMI NCAT no focal deficit no icterus no pallor R ROM intact No wheeze no rales no rhonchi Abdomen soft no rebound No lower extremity edema  Data Reviewed: reviewed   CBC    Component Value Date/Time   WBC 4.4 11/01/2023 0612   RBC 4.09 11/01/2023 0612   HGB 11.3 (L) 11/01/2023 0612   HCT 36.8 11/01/2023 0612   PLT 177 11/01/2023 0612   MCV 90.0 11/01/2023 0612   MCH 27.6 11/01/2023 0612   MCHC 30.7 11/01/2023 0612   RDW 15.1 11/01/2023 0612   LYMPHSABS 1.1 11/01/2023 0612   MONOABS 0.6 11/01/2023 0612  EOSABS 0.0 11/01/2023 0612   BASOSABS 0.0 11/01/2023 0612      Latest Ref Rng & Units 11/01/2023    6:12 AM 10/31/2023   11:37 AM 10/31/2023    6:46 AM  CMP  Glucose 70 - 99 mg/dL 409  811  914   BUN 8 - 23 mg/dL 41  39  35   Creatinine 0.44 - 1.00 mg/dL 7.82  9.56  2.13   Sodium 135 - 145 mmol/L 137  138  132   Potassium 3.5 - 5.1 mmol/L 3.6  4.7  4.7   Chloride 98 - 111 mmol/L 105  101  100   CO2 22 - 32 mmol/L 22  23  24    Calcium 8.9 - 10.3 mg/dL 8.4  9.0  8.4     Scheduled Meds:  amLODipine  10 mg Oral Daily   aspirin  81 mg Oral Daily    atorvastatin  40 mg Oral Daily   enoxaparin (LOVENOX) injection  30 mg Subcutaneous Q24H   insulin aspart  0-9 Units Subcutaneous TID WC   insulin aspart  3 Units Subcutaneous TID WC   insulin glargine-yfgn  8 Units Subcutaneous QHS   metoprolol tartrate  100 mg Oral Daily   mometasone-formoterol  2 puff Inhalation BID   montelukast  10 mg Oral QHS   multivitamin with minerals  1 tablet Oral Q breakfast   oseltamivir  30 mg Oral BID   Continuous Infusions:    Time  34  Rhetta Mura, MD  [facility]

## 2023-11-01 NOTE — Plan of Care (Signed)
  Problem: Education: Goal: Ability to describe self-care measures that may prevent or decrease complications (Diabetes Survival Skills Education) will improve Outcome: Progressing Goal: Individualized Educational Video(s) Outcome: Progressing   Problem: Coping: Goal: Ability to adjust to condition or change in health will improve Outcome: Progressing   Problem: Health Behavior/Discharge Planning: Goal: Ability to identify and utilize available resources and services will improve Outcome: Progressing Goal: Ability to manage health-related needs will improve Outcome: Progressing   Problem: Fluid Volume: Goal: Ability to maintain a balanced intake and output will improve Outcome: Progressing

## 2023-11-01 NOTE — Plan of Care (Signed)

## 2023-11-01 NOTE — Plan of Care (Signed)

## 2023-11-01 NOTE — Progress Notes (Signed)
Mobility Specialist - Progress Note  (RA) Pre-mobility: 77 bpm HR, 97% SpO2 During mobility: 83 bpm HR, 94% SpO2 Post-mobility: 81 bpm HR, 96% SPO2   11/01/23 1027  Mobility  Activity Ambulated with assistance in hallway  Level of Assistance Contact guard assist, steadying assist  Assistive Device Front wheel walker  Distance Ambulated (ft) 20 ft  Range of Motion/Exercises Active Assistive  Activity Response Tolerated fair  Mobility Referral Yes  Mobility visit 1 Mobility  Mobility Specialist Start Time (ACUTE ONLY) 1015  Mobility Specialist Stop Time (ACUTE ONLY) 1027  Mobility Specialist Time Calculation (min) (ACUTE ONLY) 12 min   Pt was found in bed and agreeable to ambulate. Pt stated having weak BLE, which limited distance. Encouraged pursed lip breathing throughout session. At EOS returned to bed with all needs met. Call bell in reach.  Billey Chang Mobility Specialist

## 2023-11-02 DIAGNOSIS — R079 Chest pain, unspecified: Secondary | ICD-10-CM | POA: Diagnosis not present

## 2023-11-02 LAB — GLUCOSE, CAPILLARY
Glucose-Capillary: 145 mg/dL — ABNORMAL HIGH (ref 70–99)
Glucose-Capillary: 191 mg/dL — ABNORMAL HIGH (ref 70–99)

## 2023-11-02 LAB — CBC WITH DIFFERENTIAL/PLATELET
Abs Immature Granulocytes: 0.01 10*3/uL (ref 0.00–0.07)
Basophils Absolute: 0 10*3/uL (ref 0.0–0.1)
Basophils Relative: 1 %
Eosinophils Absolute: 0.1 10*3/uL (ref 0.0–0.5)
Eosinophils Relative: 1 %
HCT: 38 % (ref 36.0–46.0)
Hemoglobin: 11.9 g/dL — ABNORMAL LOW (ref 12.0–15.0)
Immature Granulocytes: 0 %
Lymphocytes Relative: 39 %
Lymphs Abs: 1.5 10*3/uL (ref 0.7–4.0)
MCH: 26.9 pg (ref 26.0–34.0)
MCHC: 31.3 g/dL (ref 30.0–36.0)
MCV: 86 fL (ref 80.0–100.0)
Monocytes Absolute: 0.4 10*3/uL (ref 0.1–1.0)
Monocytes Relative: 12 %
Neutro Abs: 1.7 10*3/uL (ref 1.7–7.7)
Neutrophils Relative %: 47 %
Platelets: 197 10*3/uL (ref 150–400)
RBC: 4.42 MIL/uL (ref 3.87–5.11)
RDW: 14.6 % (ref 11.5–15.5)
WBC: 3.7 10*3/uL — ABNORMAL LOW (ref 4.0–10.5)
nRBC: 0 % (ref 0.0–0.2)

## 2023-11-02 LAB — BASIC METABOLIC PANEL
Anion gap: 9 (ref 5–15)
BUN: 28 mg/dL — ABNORMAL HIGH (ref 8–23)
CO2: 26 mmol/L (ref 22–32)
Calcium: 8.8 mg/dL — ABNORMAL LOW (ref 8.9–10.3)
Chloride: 104 mmol/L (ref 98–111)
Creatinine, Ser: 0.99 mg/dL (ref 0.44–1.00)
GFR, Estimated: >60 mL/min (ref >60)
Glucose, Bld: 124 mg/dL — ABNORMAL HIGH (ref 70–99)
Potassium: 3.6 mmol/L (ref 3.5–5.1)
Sodium: 139 mmol/L (ref 135–145)

## 2023-11-02 MED ORDER — OSELTAMIVIR PHOSPHATE 30 MG PO CAPS: 30.0000 mg | ORAL_CAPSULE | Freq: Two times a day (BID) | ORAL | 0 refills | Status: AC

## 2023-11-02 MED ORDER — METOPROLOL TARTRATE 100 MG PO TABS: 100.0000 mg | ORAL_TABLET | Freq: Every day | ORAL | 1 refills | Status: AC

## 2023-11-02 MED ORDER — GABAPENTIN 300 MG PO CAPS: 300.0000 mg | ORAL_CAPSULE | Freq: Three times a day (TID) | ORAL | 1 refills | Status: AC | PRN

## 2023-11-02 MED ORDER — AMLODIPINE BESYLATE 10 MG PO TABS: 10.0000 mg | ORAL_TABLET | Freq: Every day | ORAL | 1 refills | Status: AC

## 2023-11-02 NOTE — Progress Notes (Signed)
Mobility Specialist - Progress Note  (RA) Pre-mobility: 93 bpm HR, 97% SpO2 During mobility: 110 bpm HR, 96% SpO2 Post-mobility: 92 bpm HR, 100% SPO2   11/02/23 0857  Mobility  Activity Ambulated with assistance in room  Level of Assistance Standby assist, set-up cues, supervision of patient - no hands on  Assistive Device Front wheel walker  Distance Ambulated (ft) 20 ft  Range of Motion/Exercises Active  Activity Response Tolerated fair  Mobility Referral Yes  Mobility visit 1 Mobility  Mobility Specialist Start Time (ACUTE ONLY) [id number]  Mobility Specialist Stop Time (ACUTE ONLY) 0857  Mobility Specialist Time Calculation (min) (ACUTE ONLY) 14 min   Pt was found in bed and agreeable to ambulate. Had some coughing during session. At EOS returned to recliner chair with all needs met. Call bell in reach and RN in room.  Billey Chang Mobility Specialist

## 2023-11-02 NOTE — Plan of Care (Signed)
  Problem: Education: Goal: Ability to describe self-care measures that may prevent or decrease complications (Diabetes Survival Skills Education) will improve Outcome: Adequate for Discharge Goal: Individualized Educational Video(s) Outcome: Adequate for Discharge   Problem: Coping: Goal: Ability to adjust to condition or change in health will improve Outcome: Adequate for Discharge   Problem: Fluid Volume: Goal: Ability to maintain a balanced intake and output will improve Outcome: Adequate for Discharge   Problem: Health Behavior/Discharge Planning: Goal: Ability to identify and utilize available resources and services will improve Outcome: Adequate for Discharge Goal: Ability to manage health-related needs will improve Outcome: Adequate for Discharge   Problem: Metabolic: Goal: Ability to maintain appropriate glucose levels will improve Outcome: Adequate for Discharge   Problem: Nutritional: Goal: Maintenance of adequate nutrition will improve Outcome: Adequate for Discharge Goal: Progress toward achieving an optimal weight will improve Outcome: Adequate for Discharge   Problem: Skin Integrity: Goal: Risk for impaired skin integrity will decrease Outcome: Adequate for Discharge   Problem: Tissue Perfusion: Goal: Adequacy of tissue perfusion will improve Outcome: Adequate for Discharge   Problem: Education: Goal: Knowledge of General Education information will improve Description: Including pain rating scale, medication(s)/side effects and non-pharmacologic comfort measures Outcome: Adequate for Discharge   Problem: Health Behavior/Discharge Planning: Goal: Ability to manage health-related needs will improve Outcome: Adequate for Discharge   Problem: Clinical Measurements: Goal: Ability to maintain clinical measurements within normal limits will improve Outcome: Adequate for Discharge Goal: Will remain free from infection Outcome: Adequate for Discharge Goal:  Diagnostic test results will improve Outcome: Adequate for Discharge Goal: Respiratory complications will improve Outcome: Adequate for Discharge Goal: Cardiovascular complication will be avoided Outcome: Adequate for Discharge   Problem: Activity: Goal: Risk for activity intolerance will decrease Outcome: Adequate for Discharge   Problem: Nutrition: Goal: Adequate nutrition will be maintained Outcome: Adequate for Discharge   Problem: Coping: Goal: Level of anxiety will decrease Outcome: Adequate for Discharge   Problem: Elimination: Goal: Will not experience complications related to bowel motility Outcome: Adequate for Discharge Goal: Will not experience complications related to urinary retention Outcome: Adequate for Discharge   Problem: Pain Managment: Goal: General experience of comfort will improve and/or be controlled Outcome: Adequate for Discharge   Problem: Safety: Goal: Ability to remain free from injury will improve Outcome: Adequate for Discharge   Problem: Skin Integrity: Goal: Risk for impaired skin integrity will decrease Outcome: Adequate for Discharge   Problem: Activity: Goal: Ability to tolerate increased activity will improve Outcome: Adequate for Discharge   Problem: Clinical Measurements: Goal: Ability to maintain a body temperature in the normal range will improve Outcome: Adequate for Discharge   Problem: Respiratory: Goal: Ability to maintain adequate ventilation will improve Outcome: Adequate for Discharge Goal: Ability to maintain a clear airway will improve Outcome: Adequate for Discharge

## 2023-11-02 NOTE — Discharge Summary (Signed)
Physician Discharge Summary  Debbie Davis VHQ:[id number] DOB: [birth year] DOA: 10/30/2023  PCP: Patient, No Pcp Per  Admit date: 10/30/2023 Discharge date: 11/02/2023  Time spent: 26 minutes  Recommendations for Outpatient Follow-up:  Complete Tamiflu Needs Chem-12 in 1 week-discontinued valsartan and HCTZ this hospital stay Needs outpatient continued tobacco cessation counseling Titrate meds for neuropathy as outpatient as tolerated  Discharge Diagnoses:  MAIN problem for hospitalization   Influenza A with  acute respiratory failure on admission  Please see below for itemized issues addressed in HOpsital- refer to other progress notes for clarity if needed  Discharge Condition: Improved  Diet recommendation: Heart healthy diabetic  Filed Weights   10/30/23 1151 10/31/23 1532  Weight: 75.3 kg 75.8 kg    History of present illness:  63 year old black female HTN HLD chronic tobacco abuse complicated by intermittent claudication follows with vascular HFpEF-cardiac cath 2018 mild pulmonary hypertension EF 65% DM TY 2   Events 1/30  Came to emergency room SOB DOE left chest pain + headache--influenza positive Tmax 100  troponin slightly elevated creatinine 1.2 BNP 45 potassium 3.3 WBC 8.5 hemoglobin 12.6 platelet 197 TSH 0.12   procedures CXR 2 view mild vascular congestion no focal consolidation CT angio chest shows no pulmonary embolism steatosis     Plan   Acute respiratory failure secondary to influenza A, possible bacterial pneumonia Continue at this time Tamiflu 30 twice daily Broad-spectrum antibiotics stopped earlier in hospital stay as no concern for superimposed bacterial process By day of discharge patient was ambulating about 20 feet O2 sat remained stable she was little tachycardic but she was stable to discharge home Should be followed in the outpatient setting for further management HFpEF last EF 75% 08/2023 No signs symptoms of worsening heart failure  and fluid status seems stable Not on diuretics at home so just monitor at this time Meds adjusted as per below AKI on admission superimposed on CKD 2 BUN/creatinine bumped at admission likely secondary to sepsis in addition to thiazide and ARB which has been discontinued Transiently on IV fluids and will discharge home on only amlodipine 10 and metoprolol 100 and may need to hold for short period of time her diuretic and ARB component Will need labs in about a week and may be able to increase meds back to her usual home regimen Elevated troponin Had some chest pain but I feel this is secondary to pleurisy-no further workup at this time as trend is flat Uncontrolled DM TY 2 with diabetic polyneuropathy CBGs ranging up to 500 at admission but much improved-CBG here ranging from 1 63-1 88 on day of discharge I discontinued Lantus and sliding scale and resumed her metformin at time of discharge and she will need close follow-up For neuropathy can continue gabapentin 300 3 times daily Tobacco abuse Usually smokes less than 6 cigarettes a day not having cravings hold nicotine patch Previous mediastinal lymphadenopathy Does not seem to be present on CT scans this time around Outpatient follow-up   Discharge Exam: Vitals:   11/02/23 0806 11/02/23 0855  BP:  (!) 142/88  Pulse:  97  Resp:    Temp:    SpO2: 97%     Subj on day of d/c   Awake coherent no distress looks well feels fair breathing okay no chest pain Ambulated without too much difficulty seems comfortable  General Exam on discharge  Awake coherent no distress Mallampati 4 S1-S2 no murmur no rub no gallop ROM intact Power 5/5 No wheeze rales  rhonchi and not on oxygen 63  Discharge Instructions   Discharge Instructions     Diet - low sodium heart healthy   Complete by: As directed    Discharge instructions   Complete by: As directed    This hospital stay you were diagnosed with the flu and seemed to recover  relatively quickly You are somewhat dehydrated on admission and this is probably because of the illness but also because of you taking combination medications which included valsartan and HCTZ-as such have discontinued those meds he will continue amlodipine and metoprolol only and take that and follow-up with your primary physician in about a week I have prescribed for you gabapentin 300 mg 3 times a day to help with your diabetic neuropathy Please finish the Tamiflu as this will help you recover better We will try to arrange for a ride for you to go home Follow-up with your primary physician for labs as discussed   Increase activity slowly   Complete by: As directed       Allergies as of 11/02/2023       Reactions   Morphine And Codeine Rash        Medication List     STOP taking these medications    amLODipine-valsartan 10-160 MG tablet Commonly known as: EXFORGE   metoprolol-hydrochlorothiazide 100-50 MG tablet Commonly known as: LOPRESSOR HCT       TAKE these medications    albuterol 108 (90 Base) MCG/ACT inhaler Commonly known as: VENTOLIN HFA Inhale 1-2 puffs into the lungs every 4 (four) hours as needed for wheezing or shortness of breath.   amLODipine 10 MG tablet Commonly known as: NORVASC Take 1 tablet (10 mg total) by mouth daily. Start taking on: November 03, 2023   Anoro Ellipta 62.5-25 MCG/INH Aepb Generic drug: umeclidinium-vilanterol Inhale 1 puff into the lungs daily as needed (shortness of breath).   aspirin 81 MG chewable tablet Chew 81 mg by mouth daily.   atorvastatin 40 MG tablet Commonly known as: LIPITOR Take 40 mg by mouth daily.   fluticasone 50 MCG/ACT nasal spray Commonly known as: FLONASE Place 2 sprays into both nostrils daily as needed for allergies.   fluticasone-salmeterol 250-50 MCG/ACT Aepb Commonly known as: ADVAIR Inhale 1 puff into the lungs 2 (two) times daily.   gabapentin 300 MG capsule Commonly known as:  NEURONTIN Take 1 capsule (300 mg total) by mouth 3 (three) times daily as needed (for pain).   metFORMIN 500 MG tablet Commonly known as: Glucophage Take 1 tablet (500 mg total) by mouth 2 (two) times daily with a meal.   metoprolol tartrate 100 MG tablet Commonly known as: LOPRESSOR Take 1 tablet (100 mg total) by mouth daily. Start taking on: November 03, 2023   montelukast 10 MG tablet Commonly known as: SINGULAIR Take 10 mg by mouth at bedtime.   multivitamin with minerals Tabs tablet Take 1 tablet by mouth daily with breakfast.   oseltamivir 30 MG capsule Commonly known as: TAMIFLU Take 1 capsule (30 mg total) by mouth 2 (two) times daily.   TYLENOL 500 MG tablet Generic drug: acetaminophen Take 500-1,000 mg by mouth every 6 (six) hours as needed for mild pain (pain score 1-3) or headache.       Allergies  Allergen Reactions   Morphine And Codeine Rash      The results of significant diagnostics from this hospitalization (including imaging, microbiology, ancillary and laboratory) are listed below for reference.    Significant Diagnostic Studies:  ECHOCARDIOGRAM LIMITED Result Date: 10/31/2023    ECHOCARDIOGRAM LIMITED REPORT   Patient Name:   Debbie Davis Date of Exam: 10/31/2023 Medical Rec #:  [id number]      Height:       60.0 in Accession #:    [id number]     Weight:       166.0 lb Date of Birth:  1961/09/15       BSA:          1.724 m Patient Age:    62 years       BP:           93/63 mmHg Patient Gender: F              HR:           88 bpm. Exam Location:  Inpatient Procedure: Limited Echo, Cardiac Doppler and Color Doppler Indications:    R07.9* Chest pain, unspecified  History:        Patient has prior history of Echocardiogram examinations, most                 recent 08/01/2023. CHF, Abnormal ECG, Arrythmias:Tachycardia,                 Signs/Symptoms:Dyspnea, Shortness of Breath and Chest Pain; Risk                 Factors:Diabetes. FLU positive.  Sonographer:     Sheralyn Boatman RDCS Referring Phys: [phone] Meryle Ready [facility]  Sonographer Comments: Technically difficult study due to poor echo windows. Image acquisition challenging due to patient body habitus and Image acquisition challenging due to respiratory motion. Wheezing artifact present in doppler signals. IMPRESSIONS  1. Left ventricular ejection fraction, by estimation, is 65 to 70%. The left ventricle has normal function. There is mild concentric left ventricular hypertrophy.  2. Right ventricular systolic function is normal. The right ventricular size is normal. There is normal pulmonary artery systolic pressure.  3. The mitral valve is normal in structure.  4. The aortic valve is normal in structure. Aortic valve regurgitation is not visualized. No aortic stenosis is present.  5. The inferior vena cava is normal in size with <50% respiratory variability, suggesting right atrial pressure of 8 mmHg. FINDINGS  Left Ventricle: Left ventricular ejection fraction, by estimation, is 65 to 70%. The left ventricle has normal function. There is mild concentric left ventricular hypertrophy. Right Ventricle: The right ventricular size is normal. No increase in right ventricular wall thickness. Right ventricular systolic function is normal. There is normal pulmonary artery systolic pressure. The tricuspid regurgitant velocity is 1.89 m/s, and  with an assumed right atrial pressure of 3 mmHg, the estimated right ventricular systolic pressure is 17.3 mmHg. Left Atrium: Left atrial size was normal in size. Right Atrium: Right atrial size was normal in size. Pericardium: There is no evidence of pericardial effusion. Mitral Valve: The mitral valve is normal in structure. Tricuspid Valve: The tricuspid valve is normal in structure. Tricuspid valve regurgitation is trivial. Aortic Valve: The aortic valve is normal in structure. Aortic valve regurgitation is not visualized. No aortic stenosis is present. Pulmonic Valve: The pulmonic valve  was not well visualized. Aorta: The aortic root and ascending aorta are structurally normal, with no evidence of dilitation. Venous: The inferior vena cava is normal in size with less than 50% respiratory variability, suggesting right atrial pressure of 8 mmHg. Additional Comments: Spectral Doppler performed. Color Doppler performed.  LEFT VENTRICLE PLAX 2D LVIDd:  2.70 cm LVIDs:         1.83 cm LV PW:         1.10 cm LV IVS:        1.30 cm  LV Volumes (MOD) LV vol d, MOD A2C: 42.2 ml LV vol d, MOD A4C: 43.8 ml LV vol s, MOD A2C: 13.4 ml LV vol s, MOD A4C: 14.7 ml LV SV MOD A2C:     28.8 ml LV SV MOD A4C:     43.8 ml LV SV MOD BP:      28.6 ml RIGHT VENTRICLE            IVC RV S prime:     9.57 cm/s  IVC diam: 2.00 cm TAPSE (M-mode): 1.0 cm  AORTA Ao Asc diam: 3.30 cm MITRAL VALVE               TRICUSPID VALVE MV Area (PHT): 3.80 cm    TR Peak grad:   14.3 mmHg MV Decel Time: 200 msec    TR Vmax:        189.00 cm/s MV E velocity: 72.57 cm/s MV A velocity: 78.13 cm/s MV E/A ratio:  0.93 Arvilla Meres MD Electronically signed by Arvilla Meres MD Signature Date/Time: 10/31/2023/3:35:10 PM    Final    CT Angio Chest PE W and/or Wo Contrast Result Date: 10/30/2023 CLINICAL DATA:  Pulmonary embolism suspected, high probability. Shortness of breath while lying flat. Chest pain under left breast. Pain with coughing. EXAM: CT ANGIOGRAPHY CHEST WITH CONTRAST TECHNIQUE: Multidetector CT imaging of the chest was performed using the standard protocol during bolus administration of intravenous contrast. Multiplanar CT image reconstructions and MIPs were obtained to evaluate the vascular anatomy. RADIATION DOSE REDUCTION: This exam was performed according to the departmental dose-optimization program which includes automated exposure control, adjustment of the mA and/or kV according to patient size and/or use of iterative reconstruction technique. CONTRAST:  75mL OMNIPAQUE IOHEXOL 350 MG/ML SOLN COMPARISON:   08/20/2016. FINDINGS: Cardiovascular: The heart is normal in size and there is no pericardial effusion. Scattered coronary artery calcifications are noted. There is atherosclerotic calcification of the aorta without evidence of aneurysm. The pulmonary trunk is normal in caliber. No definite evidence of pulmonary embolism. Examination is limited due to mixing artifact. Mediastinum/Nodes: No mediastinal, hilar, or axillary lymphadenopathy by size criteria. The thyroid gland, trachea, and esophagus are within normal limits. Lungs/Pleura: Mild centrilobular emphysematous changes are noted at the lung apices. Atelectasis is noted bilaterally. No effusion or pneumothorax is seen. Upper Abdomen: Fatty infiltration of the liver is noted. No acute abnormality. Musculoskeletal: Degenerative changes are noted in the thoracic spine. No acute osseous abnormality is seen. Review of the MIP images confirms the above findings. IMPRESSION: 1. No evidence of pulmonary embolism or other acute process. 2. Emphysema. 3. Hepatic steatosis. 4. Coronary artery calcifications. 5. Aortic atherosclerosis. Electronically Signed   By: Thornell Sartorius M.D.   On: 10/30/2023 20:17   DG Chest 2 View Result Date: 10/30/2023 CLINICAL DATA:  Chest pain. EXAM: CHEST - 2 VIEW COMPARISON:  Chest radiograph dated 08/01/2023. FINDINGS: Mild central vascular congestion. No focal consolidation, pleural effusion, or pneumothorax. The cardiac silhouette is within normal limits. Atherosclerotic calcification of the aorta. No acute osseous pathology. IMPRESSION: Mild central vascular congestion. No focal consolidation. Electronically Signed   By: Elgie Collard M.D.   On: 10/30/2023 13:29    Microbiology: Recent Results (from the past 240 hours)  Resp panel by RT-PCR (RSV, Flu  A&B, Covid) Anterior Nasal Swab     Status: Abnormal   Collection Time: 10/30/23 12:39 PM   Specimen: Anterior Nasal Swab  Result Value Ref Range Status   SARS Coronavirus 2 by  RT PCR NEGATIVE NEGATIVE Final    Comment: (NOTE) SARS-CoV-2 target nucleic acids are NOT DETECTED.  The SARS-CoV-2 RNA is generally detectable in upper respiratory specimens during the acute phase of infection. The lowest concentration of SARS-CoV-2 viral copies this assay can detect is 138 copies/mL. A negative result does not preclude SARS-Cov-2 infection and should not be used as the sole basis for treatment or other patient management decisions. A negative result may occur with  improper specimen collection/handling, submission of specimen other than nasopharyngeal swab, presence of viral mutation(s) within the areas targeted by this assay, and inadequate number of viral copies(<138 copies/mL). A negative result must be combined with clinical observations, patient history, and epidemiological information. The expected result is Negative.  Fact Sheet for Patients:  BloggerCourse.com  Fact Sheet for Healthcare Providers:  SeriousBroker.it  This test is no t yet approved or cleared by the [place] FDA and  has been authorized for detection and/or diagnosis of SARS-CoV-2 by FDA under an Emergency Use Authorization (EUA). This EUA will remain  in effect (meaning this test can be used) for the duration of the COVID-19 declaration under Section 564(b)(1) of the Act, 21 U.S.C.section 360bbb-3(b)(1), unless the authorization is terminated  or revoked sooner.       Influenza A by PCR POSITIVE (A) NEGATIVE Final   Influenza B by PCR NEGATIVE NEGATIVE Final    Comment: (NOTE) The Xpert Xpress SARS-CoV-2/FLU/RSV plus assay is intended as an aid in the diagnosis of influenza from Nasopharyngeal swab specimens and should not be used as a sole basis for treatment. Nasal washings and aspirates are unacceptable for Xpert Xpress SARS-CoV-2/FLU/RSV testing.  Fact Sheet for Patients: BloggerCourse.com  Fact  Sheet for Healthcare Providers: SeriousBroker.it  This test is not yet approved or cleared by the [place] FDA and has been authorized for detection and/or diagnosis of SARS-CoV-2 by FDA under an Emergency Use Authorization (EUA). This EUA will remain in effect (meaning this test can be used) for the duration of the COVID-19 declaration under Section 564(b)(1) of the Act, 21 U.S.C. section 360bbb-3(b)(1), unless the authorization is terminated or revoked.     Resp Syncytial Virus by PCR NEGATIVE NEGATIVE Final    Comment: (NOTE) Fact Sheet for Patients: BloggerCourse.com  Fact Sheet for Healthcare Providers: SeriousBroker.it  This test is not yet approved or cleared by the [place] FDA and has been authorized for detection and/or diagnosis of SARS-CoV-2 by FDA under an Emergency Use Authorization (EUA). This EUA will remain in effect (meaning this test can be used) for the duration of the COVID-19 declaration under Section 564(b)(1) of the Act, 21 U.S.C. section 360bbb-3(b)(1), unless the authorization is terminated or revoked.  Performed at [facility], [address], [place], [place] [address]      Labs: Basic Metabolic Panel: Recent Labs  Lab 10/30/23 1149 10/31/23 0105 10/31/23 0646 10/31/23 1137 11/01/23 0612 11/02/23 0538  NA 140  --  132* 138 137 139  K 3.3*  --  4.7 4.7 3.6 3.6  CL 104  --  100 101 105 104  CO2 23  --  24 23 22 26   GLUCOSE 203*  --  503* 349* 116* 124*  BUN 20  --  35* 39* 41* 28*  CREATININE  1.23* 1.76* 2.00* 2.09* 1.66* 0.99  CALCIUM 9.1  --  8.4* 9.0 8.4* 8.8*   Liver Function Tests: No results for input(s): "AST", "ALT", "ALKPHOS", "BILITOT", "PROT", "ALBUMIN" in the last 168 hours. No results for input(s): "LIPASE", "AMYLASE" in the last 168 hours. No results for input(s): "AMMONIA" in the last 168 hours. CBC: Recent Labs  Lab  10/30/23 1149 10/31/23 0105 10/31/23 0540 11/01/23 0612 11/02/23 0538  WBC 8.5 5.2 5.2 4.4 3.7*  NEUTROABS  --   --   --  2.7 1.7  HGB 12.6 11.4* 11.0* 11.3* 11.9*  HCT 39.8 37.1 35.8* 36.8 38.0  MCV 87.7 87.1 87.1 90.0 86.0  PLT 197 173 173 177 197   Cardiac Enzymes: No results for input(s): "CKTOTAL", "CKMB", "CKMBINDEX", "TROPONINI" in the last 168 hours. BNP: BNP (last 3 results) Recent Labs    01/26/23 1435 08/01/23 0329 10/30/23 1632  BNP 14.7 21.7 45.7    ProBNP (last 3 results) No results for input(s): "PROBNP" in the last 8760 hours.  CBG: Recent Labs  Lab 11/01/23 1131 11/01/23 1649 11/01/23 2152 11/02/23 0741 11/02/23 1152  GLUCAP 180* 210* 118* 145* 191*    Signed:  Rhetta Mura MD   [facility] 11/02/2023, 12:39 PM

## 2024-07-26 ENCOUNTER — Other Ambulatory Visit: Payer: Self-pay | Admitting: Vascular Surgery

## 2024-07-26 DIAGNOSIS — I739 Peripheral vascular disease, unspecified: Secondary | ICD-10-CM
